# Patient Record
Sex: Male | Born: 1950
Health system: Southern US, Community
[De-identification: ages and names within clinical notes are randomized; demographics above are authoritative.]

## PROBLEM LIST (undated history)

## (undated) DIAGNOSIS — K7689 Other specified diseases of liver: Secondary | ICD-10-CM

## (undated) DIAGNOSIS — J8410 Pulmonary fibrosis, unspecified: Secondary | ICD-10-CM

## (undated) DIAGNOSIS — M5136 Other intervertebral disc degeneration, lumbar region: Secondary | ICD-10-CM

## (undated) DIAGNOSIS — S32050A Wedge compression fracture of fifth lumbar vertebra, initial encounter for closed fracture: Secondary | ICD-10-CM

## (undated) DIAGNOSIS — G4752 REM sleep behavior disorder: Secondary | ICD-10-CM

## (undated) DIAGNOSIS — M51369 Other intervertebral disc degeneration, lumbar region without mention of lumbar back pain or lower extremity pain: Secondary | ICD-10-CM

## (undated) DIAGNOSIS — K449 Diaphragmatic hernia without obstruction or gangrene: Secondary | ICD-10-CM

## (undated) DIAGNOSIS — G4733 Obstructive sleep apnea (adult) (pediatric): Secondary | ICD-10-CM

## (undated) DIAGNOSIS — T7840XA Allergy, unspecified, initial encounter: Secondary | ICD-10-CM

## (undated) DIAGNOSIS — I10 Essential (primary) hypertension: Secondary | ICD-10-CM

## (undated) DIAGNOSIS — C61 Malignant neoplasm of prostate: Secondary | ICD-10-CM

## (undated) DIAGNOSIS — D1800 Hemangioma unspecified site: Secondary | ICD-10-CM

## (undated) DIAGNOSIS — G4750 Parasomnia, unspecified: Secondary | ICD-10-CM

## (undated) DIAGNOSIS — R7303 Prediabetes: Secondary | ICD-10-CM

## (undated) HISTORY — DX: Other intervertebral disc degeneration, lumbar region without mention of lumbar back pain or lower extremity pain: M51.369

## (undated) HISTORY — PX: OTHER SURGICAL HISTORY: SHX169

## (undated) HISTORY — DX: Other specified diseases of liver: K76.89

## (undated) HISTORY — DX: Pulmonary fibrosis, unspecified: J84.10

## (undated) HISTORY — DX: Parasomnia, unspecified: G47.50

## (undated) HISTORY — DX: Allergy, unspecified, initial encounter: T78.40XA

## (undated) HISTORY — DX: Other intervertebral disc degeneration, lumbar region: M51.36

## (undated) HISTORY — DX: Prediabetes: R73.03

## (undated) HISTORY — DX: Diaphragmatic hernia without obstruction or gangrene: K44.9

## (undated) HISTORY — DX: Hemangioma unspecified site: D18.00

## (undated) HISTORY — DX: Obstructive sleep apnea (adult) (pediatric): G47.33

## (undated) HISTORY — DX: REM sleep behavior disorder: G47.52

## (undated) HISTORY — PX: CARDIAC CATHETERIZATION: SHX172

## (undated) HISTORY — PX: HERNIA REPAIR: SHX51

## (undated) HISTORY — DX: Wedge compression fracture of fifth lumbar vertebra, initial encounter for closed fracture: S32.050A

---

## 2017-09-10 LAB — HM COLONOSCOPY

## 2019-06-26 ENCOUNTER — Telehealth: Payer: Self-pay

## 2019-06-26 NOTE — Telephone Encounter (Signed)
Copied from [place] [id number]. Topic: Appointment Scheduling - New Patient >> Jun 25, 2019  5:31 PM Yvette Rack wrote: New patient would like to establish care.  Pt doe not have a preference of providers. Pt would like to establish with the first available.  Route to department's PEC pool.

## 2019-06-26 NOTE — Telephone Encounter (Signed)
Request forwarded to scheduling dept.

## 2019-07-08 ENCOUNTER — Other Ambulatory Visit: Payer: Self-pay

## 2019-07-11 ENCOUNTER — Ambulatory Visit (INDEPENDENT_AMBULATORY_CARE_PROVIDER_SITE_OTHER): Payer: Medicare HMO | Admitting: Family Medicine

## 2019-07-11 ENCOUNTER — Encounter: Payer: Self-pay | Admitting: Family Medicine

## 2019-07-11 ENCOUNTER — Other Ambulatory Visit: Payer: Self-pay

## 2019-07-11 VITALS — BP 140/82 | HR 69 | Temp 97.3°F | Resp 18 | Ht 68.0 in | Wt 141.0 lb

## 2019-07-11 DIAGNOSIS — Z0000 Encounter for general adult medical examination without abnormal findings: Secondary | ICD-10-CM

## 2019-07-11 DIAGNOSIS — Z125 Encounter for screening for malignant neoplasm of prostate: Secondary | ICD-10-CM

## 2019-07-11 NOTE — Progress Notes (Signed)
Subjective:    Patient ID: David Church, male    DOB: 1951-07-28, 68 y.o.   MRN: [id number]  HPI   Patient presents to clinic to establish with PCP.  He recently moved to [place] with his wife for retirement.  They moved here from [place].  He currently takes no medications and has not taken any medications in many years.  He reports no pertinent past medical history.  Otherwise social, surgical family history reviewed and updated accordingly in chart.  He does see the eye doctor annually and dentist twice per year.  He has those appointments upcoming to establish with providers here in [place].  He did have colonoscopy 6 months ago.  It was normal and was told to return in 10 years.  History reviewed. No pertinent past medical history.   Social History   Tobacco Use  . Smoking status: Current Every Day Smoker  . Smokeless tobacco: Never Used  Substance Use Topics  . Alcohol use: Yes   Past Surgical History:  Procedure Laterality Date  . HERNIA REPAIR     15 years ago   Family History  Problem Relation Age of Onset  . Cancer Mother   . Diabetes Mother   . Alzheimer's disease Father    Review of Systems  Constitutional: Negative for chills, fatigue and fever.  HENT: Negative for congestion, ear pain, sinus pain and sore throat.   Eyes: Negative.   Respiratory: Negative for cough, shortness of breath and wheezing.   Cardiovascular: Negative for chest pain, palpitations and leg swelling.  Gastrointestinal: Negative for abdominal pain, diarrhea, nausea and vomiting.  Genitourinary: Negative for dysuria, frequency and urgency.  Musculoskeletal: Negative for arthralgias and myalgias.  Skin: Negative for color change, pallor and rash.  Neurological: Negative for syncope, light-headedness and headaches.  Psychiatric/Behavioral: The patient is not nervous/anxious.       Objective:   Physical Exam Vitals signs and nursing note reviewed.  Constitutional:    General: He is not in acute distress.    Appearance: He is not toxic-appearing.  HENT:     Head: Normocephalic and atraumatic.     Right Ear: Tympanic membrane, ear canal and external ear normal.     Left Ear: Tympanic membrane, ear canal and external ear normal.  Eyes:     General: No scleral icterus.    Extraocular Movements: Extraocular movements intact.     Conjunctiva/sclera: Conjunctivae normal.     Pupils: Pupils are equal, round, and reactive to light.  Neck:     Musculoskeletal: Normal range of motion and neck supple.  Cardiovascular:     Rate and Rhythm: Normal rate and regular rhythm.     Heart sounds: Normal heart sounds.  Pulmonary:     Effort: Pulmonary effort is normal. No respiratory distress.     Breath sounds: Normal breath sounds.  Abdominal:     General: Bowel sounds are normal. There is no distension.     Palpations: Abdomen is soft. There is no mass.     Tenderness: There is no abdominal tenderness. There is no right CVA tenderness, left CVA tenderness, guarding or rebound.  Genitourinary:    Comments: Declines GU exam Musculoskeletal:     Right lower leg: No edema.     Left lower leg: No edema.  Skin:    General: Skin is warm and dry.     Capillary Refill: Capillary refill takes less than 2 seconds.     Coloration: Skin is  not jaundiced or pale.  Neurological:     General: No focal deficit present.     Mental Status: He is alert and oriented to person, place, and time.     Gait: Gait normal.  Psychiatric:        Mood and Affect: Mood normal.        Behavior: Behavior normal.        Thought Content: Thought content normal.        Judgment: Judgment normal.    Today's Vitals   07/11/19 1032  BP: 140/82  Pulse: 69  Resp: 18  Temp: (!) 97.3 F (36.3 C)  TempSrc: Temporal  SpO2: 98%  Weight: 141 lb (64 kg)  Height: 5\' 8"  (1.727 m)   Body mass index is 21.44 kg/m.    Assessment & Plan:    Well adult exam - overall patient appears to be a  healthy 68 year old male.  We will get standard screening blood work including CBC, CMP, lipid panel, thyroid panel and PSA.  He does follow with a dentist and eye doctor regularly and is planning to establish care with providers in his new state.  He always wears seatbelt when in car.  Also discussed safe sun practices including wearing SPF at least of 30 when outdoors.  Patient made aware that flu vaccines will be available in approximately September 2020.  Patient will follow-up every 6 months for general checkup.  He is aware we will call him with lab results when they are available and he can return to clinic sooner if any issues arise.

## 2019-07-25 ENCOUNTER — Other Ambulatory Visit (INDEPENDENT_AMBULATORY_CARE_PROVIDER_SITE_OTHER): Payer: Medicare HMO

## 2019-07-25 ENCOUNTER — Other Ambulatory Visit: Payer: Self-pay

## 2019-07-25 DIAGNOSIS — Z125 Encounter for screening for malignant neoplasm of prostate: Secondary | ICD-10-CM | POA: Diagnosis not present

## 2019-07-25 DIAGNOSIS — D72820 Lymphocytosis (symptomatic): Secondary | ICD-10-CM

## 2019-07-25 DIAGNOSIS — R944 Abnormal results of kidney function studies: Secondary | ICD-10-CM

## 2019-07-25 DIAGNOSIS — Z0000 Encounter for general adult medical examination without abnormal findings: Secondary | ICD-10-CM | POA: Diagnosis not present

## 2019-07-25 LAB — CBC WITH DIFFERENTIAL/PLATELET
Basophils Absolute: 0.1 10*3/uL (ref 0.0–0.1)
Basophils Relative: 1.3 % (ref 0.0–3.0)
Eosinophils Absolute: 0.1 10*3/uL (ref 0.0–0.7)
Eosinophils Relative: 1.4 % (ref 0.0–5.0)
HCT: 46.9 % (ref 39.0–52.0)
Hemoglobin: 15.6 g/dL (ref 13.0–17.0)
Lymphocytes Relative: 58.1 % — ABNORMAL HIGH (ref 12.0–46.0)
Lymphs Abs: 2.9 10*3/uL (ref 0.7–4.0)
MCHC: 33.3 g/dL (ref 30.0–36.0)
MCV: 89 fl (ref 78.0–100.0)
Monocytes Absolute: 0.5 10*3/uL (ref 0.1–1.0)
Monocytes Relative: 9.2 % (ref 3.0–12.0)
Neutro Abs: 1.5 10*3/uL (ref 1.4–7.7)
Neutrophils Relative %: 30 % — ABNORMAL LOW (ref 43.0–77.0)
Platelets: 205 10*3/uL (ref 150.0–400.0)
RBC: 5.27 Mil/uL (ref 4.22–5.81)
RDW: 13.5 % (ref 11.5–15.5)
WBC: 5 10*3/uL (ref 4.0–10.5)

## 2019-07-25 LAB — THYROID PANEL WITH TSH
Free Thyroxine Index: 2.5 (ref 1.4–3.8)
T3 Uptake: 31 % (ref 22–35)
T4, Total: 8 ug/dL (ref 4.9–10.5)
TSH: 0.97 mIU/L (ref 0.40–4.50)

## 2019-07-25 LAB — COMPREHENSIVE METABOLIC PANEL
ALT: 15 U/L (ref 0–53)
AST: 18 U/L (ref 0–37)
Albumin: 4.1 g/dL (ref 3.5–5.2)
Alkaline Phosphatase: 53 U/L (ref 39–117)
BUN: 18 mg/dL (ref 6–23)
CO2: 29 mEq/L (ref 19–32)
Calcium: 9 mg/dL (ref 8.4–10.5)
Chloride: 104 mEq/L (ref 96–112)
Creatinine, Ser: 1.31 mg/dL (ref 0.40–1.50)
GFR: 54.65 mL/min — ABNORMAL LOW (ref >60.00)
Glucose, Bld: 81 mg/dL (ref 70–99)
Potassium: 4.4 mEq/L (ref 3.5–5.1)
Sodium: 140 mEq/L (ref 135–145)
Total Bilirubin: 0.5 mg/dL (ref 0.2–1.2)
Total Protein: 7.1 g/dL (ref 6.0–8.3)

## 2019-07-25 LAB — LIPID PANEL
Cholesterol: 211 mg/dL — ABNORMAL HIGH (ref 0–200)
HDL: 66.8 mg/dL (ref >39.00)
LDL Cholesterol: 127 mg/dL — ABNORMAL HIGH (ref 0–99)
NonHDL: 144.1
Total CHOL/HDL Ratio: 3
Triglycerides: 85 mg/dL (ref 0.0–149.0)
VLDL: 17 mg/dL (ref 0.0–40.0)

## 2019-07-25 LAB — PSA, MEDICARE: PSA: 1.11 ng/ml (ref 0.10–4.00)

## 2019-07-28 NOTE — Addendum Note (Signed)
Addended by: Philis Nettle on: 07/28/2019 12:57 PM   Modules accepted: Orders

## 2019-07-31 DIAGNOSIS — H524 Presbyopia: Secondary | ICD-10-CM | POA: Diagnosis not present

## 2019-07-31 DIAGNOSIS — Z0100 Encounter for examination of eyes and vision without abnormal findings: Secondary | ICD-10-CM | POA: Diagnosis not present

## 2019-08-13 ENCOUNTER — Other Ambulatory Visit (INDEPENDENT_AMBULATORY_CARE_PROVIDER_SITE_OTHER): Payer: Medicare HMO

## 2019-08-13 ENCOUNTER — Other Ambulatory Visit: Payer: Self-pay

## 2019-08-13 DIAGNOSIS — D72820 Lymphocytosis (symptomatic): Secondary | ICD-10-CM

## 2019-08-13 DIAGNOSIS — R944 Abnormal results of kidney function studies: Secondary | ICD-10-CM

## 2019-08-13 LAB — BASIC METABOLIC PANEL
BUN: 14 mg/dL (ref 6–23)
CO2: 28 mEq/L (ref 19–32)
Calcium: 9.7 mg/dL (ref 8.4–10.5)
Chloride: 103 mEq/L (ref 96–112)
Creatinine, Ser: 1.12 mg/dL (ref 0.40–1.50)
GFR: 65.47 mL/min (ref >60.00)
Glucose, Bld: 97 mg/dL (ref 70–99)
Potassium: 4.9 mEq/L (ref 3.5–5.1)
Sodium: 138 mEq/L (ref 135–145)

## 2019-08-13 LAB — CBC WITH DIFFERENTIAL/PLATELET
Basophils Absolute: 0 10*3/uL (ref 0.0–0.1)
Basophils Relative: 0.8 % (ref 0.0–3.0)
Eosinophils Absolute: 0 10*3/uL (ref 0.0–0.7)
Eosinophils Relative: 0.7 % (ref 0.0–5.0)
HCT: 45.4 % (ref 39.0–52.0)
Hemoglobin: 15.1 g/dL (ref 13.0–17.0)
Lymphocytes Relative: 60.1 % — ABNORMAL HIGH (ref 12.0–46.0)
Lymphs Abs: 3.6 10*3/uL (ref 0.7–4.0)
MCHC: 33.2 g/dL (ref 30.0–36.0)
MCV: 87.8 fl (ref 78.0–100.0)
Monocytes Absolute: 0.5 10*3/uL (ref 0.1–1.0)
Monocytes Relative: 8.9 % (ref 3.0–12.0)
Neutro Abs: 1.8 10*3/uL (ref 1.4–7.7)
Neutrophils Relative %: 29.5 % — ABNORMAL LOW (ref 43.0–77.0)
Platelets: 535 10*3/uL — ABNORMAL HIGH (ref 150.0–400.0)
RBC: 5.17 Mil/uL (ref 4.22–5.81)
RDW: 13.3 % (ref 11.5–15.5)
WBC: 6 10*3/uL (ref 4.0–10.5)

## 2019-08-15 NOTE — Addendum Note (Signed)
Addended by: Philis Nettle on: 08/15/2019 12:52 PM   Modules accepted: Orders

## 2019-08-22 ENCOUNTER — Other Ambulatory Visit: Payer: Self-pay

## 2019-08-22 ENCOUNTER — Inpatient Hospital Stay: Payer: Medicare HMO | Attending: Oncology | Admitting: Oncology

## 2019-08-22 ENCOUNTER — Encounter: Payer: Self-pay | Admitting: Oncology

## 2019-08-22 ENCOUNTER — Inpatient Hospital Stay: Payer: Medicare HMO

## 2019-08-22 VITALS — BP 167/85 | HR 64 | Temp 97.1°F | Resp 16 | Ht 69.0 in | Wt 140.6 lb

## 2019-08-22 DIAGNOSIS — R590 Localized enlarged lymph nodes: Secondary | ICD-10-CM | POA: Diagnosis not present

## 2019-08-22 DIAGNOSIS — Z79899 Other long term (current) drug therapy: Secondary | ICD-10-CM | POA: Diagnosis not present

## 2019-08-22 DIAGNOSIS — R69 Illness, unspecified: Secondary | ICD-10-CM | POA: Diagnosis not present

## 2019-08-22 DIAGNOSIS — D709 Neutropenia, unspecified: Secondary | ICD-10-CM | POA: Diagnosis not present

## 2019-08-22 DIAGNOSIS — F17210 Nicotine dependence, cigarettes, uncomplicated: Secondary | ICD-10-CM | POA: Diagnosis not present

## 2019-08-22 LAB — CBC WITH DIFFERENTIAL/PLATELET
Abs Immature Granulocytes: 0 10*3/uL (ref 0.00–0.07)
Basophils Absolute: 0.1 10*3/uL (ref 0.0–0.1)
Basophils Relative: 2 %
Eosinophils Absolute: 0 10*3/uL (ref 0.0–0.5)
Eosinophils Relative: 0 %
HCT: 46.5 % (ref 39.0–52.0)
Hemoglobin: 15 g/dL (ref 13.0–17.0)
Immature Granulocytes: 0 %
Lymphocytes Relative: 55 %
Lymphs Abs: 1.8 10*3/uL (ref 0.7–4.0)
MCH: 28.5 pg (ref 26.0–34.0)
MCHC: 32.3 g/dL (ref 30.0–36.0)
MCV: 88.4 fL (ref 80.0–100.0)
Monocytes Absolute: 0.3 10*3/uL (ref 0.1–1.0)
Monocytes Relative: 9 %
Neutro Abs: 1.2 10*3/uL — ABNORMAL LOW (ref 1.7–7.7)
Neutrophils Relative %: 34 %
Platelets: 253 10*3/uL (ref 150–400)
RBC: 5.26 MIL/uL (ref 4.22–5.81)
RDW: 12.6 % (ref 11.5–15.5)
WBC: 3.4 10*3/uL — ABNORMAL LOW (ref 4.0–10.5)
nRBC: 0 % (ref 0.0–0.2)

## 2019-08-22 LAB — TECHNOLOGIST SMEAR REVIEW: Plt Morphology: ADEQUATE

## 2019-08-22 LAB — VITAMIN B12: Vitamin B-12: 356 pg/mL (ref 180–914)

## 2019-08-22 LAB — FOLATE: Folate: 12.9 ng/mL (ref >5.9)

## 2019-08-22 NOTE — Progress Notes (Signed)
Pt has allergies a lot and has sinus infection 2 times a year. And take atb. Mostly spring and fall

## 2019-08-22 NOTE — Progress Notes (Signed)
Hematology/Oncology Consult note [facility] [phone] [phone]  Patient Care Team: Jodelle Green, FNP as PCP - General (Family Medicine)   Name of the patient: David Church  [id number]  [birth year]    Reason for referral-neutropenia   Referring physician- Philis Nettle  Date of visit: 08/22/19   History of presenting illness-patient is a 68 year old African-American male referred to [place] for neutrop[place].CBC from 07/25/2019 showed white count of 5, H&H of 15.6/46.9 with a platelet count of 205.  He had neutropenia with an absolute neutrophil count of 1.5 with relative lymphocytosis.  Repeat CBC a month later showed white count of 6 with an ANC of 1.8 again with relative lymphocytosis.  Platelet counts this time were higher at 535.  Patient is otherwise healthy and other than seasonal allergies he reports no other medical problems and takes no other medications.  Denies any over-the-counter herbal medications.  His appetite is good and his weight has remained stable.  Denies any recurrent infections or hospitalizations.  He does smoke about 2 cigarettes/day and has been doing so for the last several years.  I do not have any prior CBCs for comparison.  He moved here from [place] but does not have any prior CBCs with him.  ECOG PS- 0  Pain scale- 0   Review of systems- Review of Systems  Constitutional: Negative for chills, fever, malaise/fatigue and weight loss.  HENT: Negative for congestion, ear discharge and nosebleeds.   Eyes: Negative for blurred vision.  Respiratory: Negative for cough, hemoptysis, sputum production, shortness of breath and wheezing.   Cardiovascular: Negative for chest pain, palpitations, orthopnea and claudication.  Gastrointestinal: Negative for abdominal pain, blood in stool, constipation, diarrhea, heartburn, melena, nausea and vomiting.  Genitourinary: Negative for dysuria, flank pain, frequency, hematuria and  urgency.  Musculoskeletal: Negative for back pain, joint pain and myalgias.  Skin: Negative for rash.  Neurological: Negative for dizziness, tingling, focal weakness, seizures, weakness and headaches.  Endo/Heme/Allergies: Does not bruise/bleed easily.  Psychiatric/Behavioral: Negative for depression and suicidal ideas. The patient does not have insomnia.     No Known Allergies  There are no active problems to display for this patient.    Past Medical History:  Diagnosis Date  . Allergy      Past Surgical History:  Procedure Laterality Date  . HERNIA REPAIR     15 years ago    Social History   Socioeconomic History  . Marital status: Married    Spouse name: Not on file  . Number of children: Not on file  . Years of education: Not on file  . Highest education level: Not on file  Occupational History  . Not on file  Social Needs  . Financial resource strain: Not on file  . Food insecurity    Worry: Not on file    Inability: Not on file  . Transportation needs    Medical: Not on file    Non-medical: Not on file  Tobacco Use  . Smoking status: Current Every Day Smoker    Packs/day: 0.25  . Smokeless tobacco: Never Used  . Tobacco comment: 2 cig a day  Substance and Sexual Activity  . Alcohol use: Yes    Alcohol/week: 2.0 standard drinks    Types: 2 Cans of beer per week    Comment: 2 beers a day and wine on special occasion  . Drug use: Not Currently    Types: Marijuana, Cocaine    Comment: did  drugs but quit in early 30's  . Sexual activity: Yes  Lifestyle  . Physical activity    Days per week: Not on file    Minutes per session: Not on file  . Stress: Not on file  Relationships  . Social Herbalist on phone: Not on file    Gets together: Not on file    Attends religious service: Not on file    Active member of club or organization: Not on file    Attends meetings of clubs or organizations: Not on file    Relationship status: Not on file   . Intimate partner violence    Fear of current or ex partner: Not on file    Emotionally abused: Not on file    Physically abused: Not on file    Forced sexual activity: Not on file  Other Topics Concern  . Not on file  Social History Narrative  . Not on file     Family History  Problem Relation Age of Onset  . Cancer Mother   . Diabetes Mother   . Alzheimer's disease Father   . Heart attack Father      Current Outpatient Medications:  .  cetirizine (ZYRTEC) 10 MG tablet, Take 10 mg by mouth daily., Disp: , Rfl:    Physical exam:  Vitals:   08/22/19 1128  BP: (!) 167/85  Pulse: 64  Resp: 16  Temp: (!) 97.1 F (36.2 C)  TempSrc: Tympanic  Weight: 140 lb 9.6 oz (63.8 kg)  Height: 5\' 9"  (1.753 m)   Physical Exam Constitutional:      Comments: Thin African-American male in no acute distress.  HENT:     Head: Normocephalic and atraumatic.  Eyes:     Pupils: Pupils are equal, round, and reactive to light.  Neck:     Musculoskeletal: Normal range of motion.  Cardiovascular:     Rate and Rhythm: Normal rate and regular rhythm.     Heart sounds: Normal heart sounds.  Pulmonary:     Effort: Pulmonary effort is normal.     Breath sounds: Normal breath sounds.  Abdominal:     General: Bowel sounds are normal. There is no distension.     Palpations: Abdomen is soft.     Tenderness: There is no abdominal tenderness.     Comments: No palpable splenomegaly  Lymphadenopathy:     Comments: Palpable right axillary adenopathy  Skin:    General: Skin is warm and dry.  Neurological:     Mental Status: He is alert and oriented to person, place, and time.        CMP Latest Ref Rng & Units 08/13/2019  Glucose 70 - 99 mg/dL 97  BUN 6 - 23 mg/dL 14  Creatinine 0.40 - 1.50 mg/dL 1.12  Sodium 135 - 145 mEq/L 138  Potassium 3.5 - 5.1 mEq/L 4.9  Chloride 96 - 112 mEq/L 103  CO2 19 - 32 mEq/L 28  Calcium 8.4 - 10.5 mg/dL 9.7  Total Protein 6.0 - 8.3 g/dL -  Total  Bilirubin 0.2 - 1.2 mg/dL -  Alkaline Phos 39 - 117 U/L -  AST 0 - 37 U/L -  ALT 0 - 53 U/L -   CBC Latest Ref Rng & Units 08/22/2019  WBC 4.0 - 10.5 K/uL 3.4(L)  Hemoglobin 13.0 - 17.0 g/dL 15.0  Hematocrit 39.0 - 52.0 % 46.5  Platelets 150 - 400 K/uL 253     Assessment and plan-  Patient is a 68 y.o. male referred for neutropenia  I have 2 recent CBCs for comparison which shows a normal white count but differential shows neutropenia with an [place] between 1.5-1.8.  I do not have any prior CBCs for comparison.  His hemoglobin was normal but his platelet count was 200 on one occasion and 500 on the other.  I plan to get a repeat CBC with differential, smear review, B12, folate, HIV and hepatitis C testing.  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  He has also been a long-term smoker.  I will proceed with a CT chest at this time  I will see him back in 2 weeks time to discuss the results of the CT and blood work and further management   Thank you for this kind referral and the opportunity to participate in the care of this patient   Visit Diagnosis 1. Neutropenia, unspecified type ([place])   2. Axillary adenopathy   3. Localized enlarged lymph nodes     Dr. Randa Evens, MD, MPH [facility] at [facility] [id number] 08/22/2019 1:41 PM

## 2019-08-23 LAB — HEPATITIS C ANTIBODY: HCV Ab: 0.2 s/co ratio (ref 0.0–0.9)

## 2019-08-23 LAB — HIV ANTIBODY (ROUTINE TESTING W REFLEX): HIV Screen 4th Generation wRfx: NONREACTIVE

## 2019-09-02 ENCOUNTER — Ambulatory Visit: Admission: RE | Admit: 2019-09-02 | Payer: Medicare HMO | Source: Ambulatory Visit

## 2019-09-04 ENCOUNTER — Other Ambulatory Visit: Payer: Self-pay

## 2019-09-04 ENCOUNTER — Encounter: Payer: Self-pay | Admitting: Oncology

## 2019-09-04 NOTE — Progress Notes (Signed)
Patient stated that he had been doing well with no complaints. Patient would like for physician to call wife when she is in the room.

## 2019-09-05 ENCOUNTER — Other Ambulatory Visit: Payer: Self-pay | Admitting: *Deleted

## 2019-09-05 ENCOUNTER — Inpatient Hospital Stay: Payer: Medicare HMO | Attending: Oncology | Admitting: Oncology

## 2019-09-05 ENCOUNTER — Other Ambulatory Visit: Payer: Self-pay

## 2019-09-05 VITALS — BP 167/91 | HR 73 | Temp 98.3°F | Ht 68.0 in | Wt 142.0 lb

## 2019-09-05 DIAGNOSIS — R590 Localized enlarged lymph nodes: Secondary | ICD-10-CM

## 2019-09-05 DIAGNOSIS — F17210 Nicotine dependence, cigarettes, uncomplicated: Secondary | ICD-10-CM | POA: Diagnosis not present

## 2019-09-05 DIAGNOSIS — R69 Illness, unspecified: Secondary | ICD-10-CM | POA: Diagnosis not present

## 2019-09-05 DIAGNOSIS — D709 Neutropenia, unspecified: Secondary | ICD-10-CM

## 2019-09-05 NOTE — Progress Notes (Signed)
Hematology/Oncology Consult note [facility]  Telephone:([phone] [phone]  Patient Care Team: Jodelle Green, FNP as PCP - General (Family Medicine)   Name of the patient: David Church  [id number]  06/09/1951   Date of visit: 09/05/19  Diagnosis- 1.  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  Right axillary adenopathy of unknown etiology  Chief complaint/ Reason for visit-discuss results of blood work  Heme/Onc history: patient is a 68 year old African-American male referred to [place] for neutrop[place].CBC from 07/25/2019 showed white count of 5, H&H of 15.6/46.9 with a platelet count of 205.  He had neutropenia with an absolute neutrophil count of 1.5 with relative lymphocytosis.  Repeat CBC a month later showed white count of 6 with an ANC of 1.8 again with relative lymphocytosis.  Platelet counts this time were higher at 535.  Patient is otherwise healthy and other than seasonal allergies he reports no other medical problems and takes no other medications.  Denies any over-the-counter herbal medications.  His appetite is good and his weight has remained stable.  Denies any recurrent infections or hospitalizations.  He does smoke about 2 cigarettes/day and has been doing so for the last several years.  I do not have any prior CBCs for comparison.  He moved here from [place] but does not have any prior CBCs with him.   Interval history-patient feels well and denies any complaints at this time.  His wife states that occasionally he has fevers about twice a year sometimes even needing hospitalization and IV antibiotics.  ECOG PS- 0 Pain scale- 0   Review of systems- Review of Systems  Constitutional: Negative for chills, fever, malaise/fatigue and weight loss.  HENT: Negative for congestion, ear discharge and nosebleeds.   Eyes: Negative for blurred vision.  Respiratory: Negative for cough, hemoptysis, sputum production, shortness of breath  and wheezing.   Cardiovascular: Negative for chest pain, palpitations, orthopnea and claudication.  Gastrointestinal: Negative for abdominal pain, blood in stool, constipation, diarrhea, heartburn, melena, nausea and vomiting.  Genitourinary: Negative for dysuria, flank pain, frequency, hematuria and urgency.  Musculoskeletal: Negative for back pain, joint pain and myalgias.  Skin: Negative for rash.  Neurological: Negative for dizziness, tingling, focal weakness, seizures, weakness and headaches.  Endo/Heme/Allergies: Does not bruise/bleed easily.  Psychiatric/Behavioral: Negative for depression and suicidal ideas. The patient does not have insomnia.       No Known Allergies   Past Medical History:  Diagnosis Date  . Allergy      Past Surgical History:  Procedure Laterality Date  . HERNIA REPAIR     15 years ago    Social History   Socioeconomic History  . Marital status: Married    Spouse name: Not on file  . Number of children: Not on file  . Years of education: Not on file  . Highest education level: Not on file  Occupational History  . Not on file  Social Needs  . Financial resource strain: Not on file  . Food insecurity    Worry: Not on file    Inability: Not on file  . Transportation needs    Medical: Not on file    Non-medical: Not on file  Tobacco Use  . Smoking status: Current Every Day Smoker    Packs/day: 0.25  . Smokeless tobacco: Never Used  . Tobacco comment: 2 cig a day  Substance and Sexual Activity  . Alcohol use: Yes    Alcohol/week: 2.0 standard drinks  Types: 2 Cans of beer per week    Comment: 2 beers a day and wine on special occasion  . Drug use: Not Currently    Types: Marijuana, Cocaine    Comment: did drugs but quit in early 30's  . Sexual activity: Yes  Lifestyle  . Physical activity    Days per week: Not on file    Minutes per session: Not on file  . Stress: Not on file  Relationships  . Social Herbalist on  phone: Not on file    Gets together: Not on file    Attends religious service: Not on file    Active member of club or organization: Not on file    Attends meetings of clubs or organizations: Not on file    Relationship status: Not on file  . Intimate partner violence    Fear of current or ex partner: Not on file    Emotionally abused: Not on file    Physically abused: Not on file    Forced sexual activity: Not on file  Other Topics Concern  . Not on file  Social History Narrative  . Not on file    Family History  Problem Relation Age of Onset  . Cancer Mother   . Diabetes Mother   . Alzheimer's disease Father   . Heart attack Father      Current Outpatient Medications:  .  cetirizine (ZYRTEC) 10 MG tablet, Take 10 mg by mouth daily., Disp: , Rfl:   Physical exam:  Vitals:   09/05/19 1125  BP: (!) 167/91  Pulse: 73  Temp: 98.3 F (36.8 C)  TempSrc: Tympanic  Weight: 142 lb (64.4 kg)  Height: 5' 8"  (1.727 m)   Physical Exam Constitutional:      General: He is not in acute distress. HENT:     Head: Normocephalic and atraumatic.  Eyes:     Pupils: Pupils are equal, round, and reactive to light.  Neck:     Musculoskeletal: Normal range of motion.  Cardiovascular:     Rate and Rhythm: Normal rate and regular rhythm.     Heart sounds: Normal heart sounds.  Pulmonary:     Effort: Pulmonary effort is normal.     Breath sounds: Normal breath sounds.  Abdominal:     General: Bowel sounds are normal.     Palpations: Abdomen is soft.  Lymphadenopathy:     Comments: Bilateral palpable axillary adenopathy right greater than left.  Midline chest wall swelling noted consistent with lipoma  Skin:    General: Skin is warm and dry.  Neurological:     Mental Status: He is alert and oriented to person, place, and time.      CMP Latest Ref Rng & Units 08/13/2019  Glucose 70 - 99 mg/dL 97  BUN 6 - 23 mg/dL 14  Creatinine 0.40 - 1.50 mg/dL 1.12  Sodium 135 - 145 mEq/L  138  Potassium 3.5 - 5.1 mEq/L 4.9  Chloride 96 - 112 mEq/L 103  CO2 19 - 32 mEq/L 28  Calcium 8.4 - 10.5 mg/dL 9.7  Total Protein 6.0 - 8.3 g/dL -  Total Bilirubin 0.2 - 1.2 mg/dL -  Alkaline Phos 39 - 117 U/L -  AST 0 - 37 U/L -  ALT 0 - 53 U/L -   CBC Latest Ref Rng & Units 08/22/2019  WBC 4.0 - 10.5 K/uL 3.4(L)  Hemoglobin 13.0 - 17.0 g/dL 15.0  Hematocrit 39.0 - 52.0 %  46.5  Platelets 150 - 400 K/uL 253     Assessment and plan- Patient is a 68 y.o. male referred for neutropenia likely benign ethnic neutropenia but also found to have right axillary adenopathy  1.  I do not have any prior CBCs over the last couple of years for comparison.Patient has isolated neutropenia in the absence of other cytopenias.  B12 folate HIV and hep C are unremarkable.  Severe review was unremarkable.  LFTs are normal.  I am inclined to monitor this conservatively without a bone marrow biopsy at this time.  I will see him back in 1 month's time with a repeat CBC with differential  2.  Patient has bilateral palpable axillary adenopathy right greater than left on clinical exam.  CT chest and ultrasound was both denied by his insurance.  For his insurance he would need the right biopsy first and I will therefore refer him to general surgery for consideration of axillary lymph node biopsy for tissue diagnosis.  I do not feel any other areas of palpable adenopathy other than axilla.   Visit Diagnosis 1. Axillary adenopathy   2. Neutropenia, unspecified type [facility])      Dr. Randa Evens, MD, MPH [facility] at [facility] [phone] 09/05/2019 2:04 PM

## 2019-09-08 ENCOUNTER — Ambulatory Visit: Payer: Medicare HMO | Admitting: Surgery

## 2019-09-10 ENCOUNTER — Ambulatory Visit (INDEPENDENT_AMBULATORY_CARE_PROVIDER_SITE_OTHER): Payer: Medicare HMO | Admitting: Surgery

## 2019-09-10 ENCOUNTER — Encounter: Payer: Self-pay | Admitting: Surgery

## 2019-09-10 ENCOUNTER — Other Ambulatory Visit: Payer: Self-pay

## 2019-09-10 VITALS — BP 165/88 | HR 65 | Temp 97.9°F | Ht 69.0 in | Wt 140.2 lb

## 2019-09-10 DIAGNOSIS — R590 Localized enlarged lymph nodes: Secondary | ICD-10-CM

## 2019-09-10 NOTE — Patient Instructions (Signed)
Our surgery scheduler will call you to schedule a surgery date. Please see your [facility] sheet for information.

## 2019-09-11 ENCOUNTER — Encounter: Payer: Self-pay | Admitting: Surgery

## 2019-09-11 ENCOUNTER — Telehealth: Payer: Self-pay | Admitting: Surgery

## 2019-09-11 NOTE — Telephone Encounter (Signed)
Pt has been advised of pre admission date/time, Covid Testing date and Surgery date.  Surgery Date: 09/18/19 with Dr Othelia Pulling excisional biopsy of axillary node.  Preadmission Testing Date: 09/15/19 @ 1:30pm-patient advised to arrive at [facility] entrance.  Covid Testing Date: 09/15/19 following pre admit appt - patient advised to go to the [place] [facility]  [employer] Video sent via [employer] and [employer].  Patient has been made aware to call [phone], between 1-3:00pm the day before surgery, to find out what time to arrive.

## 2019-09-11 NOTE — Progress Notes (Signed)
Patient ID: David Church, male   DOB: 1950-12-24, 68 y.o.   MRN: [id number]  HPI David Church is a 68 y.o. male in consultation at the request of Dr. Janese Banks for lymphadenopathy.  He has been work-up for neutropenia likely benign.  He does have right axillary adenopathy of unknown etiology.  He denies any fevers any chills any B type symptoms.  Does have a pending CT of the chest.  BMP was completely normal and CBC was normal. Is able to perform more than 6 METS of activity without any shortness of breath or chest pain.  HPI  Past Medical History:  Diagnosis Date  . Allergy     Past Surgical History:  Procedure Laterality Date  . HERNIA REPAIR     15 years ago-right inguinal     Family History  Problem Relation Age of Onset  . Cancer Mother 52  . Diabetes Mother   . Alzheimer's disease Father   . Heart attack Father   . Stomach cancer Maternal Aunt   . Lung cancer Maternal Uncle     Social History Social History   Tobacco Use  . Smoking status: Current Every Day Smoker    Packs/day: 0.10  . Smokeless tobacco: Never Used  . Tobacco comment: 2 cig a day  Substance Use Topics  . Alcohol use: Yes    Alcohol/week: 2.0 standard drinks    Types: 2 Cans of beer per week    Comment: 2 beers a day and wine on special occasion  . Drug use: Not Currently    Types: Marijuana, Cocaine    Comment: did drugs but quit in early 30's    No Known Allergies  Current Outpatient Medications  Medication Sig Dispense Refill  . cetirizine (ZYRTEC) 10 MG tablet Take 10 mg by mouth daily.     No current facility-administered medications for this visit.      Review of Systems Full ROS  was asked and was negative except for the information on the HPI  Physical Exam Blood pressure (!) 165/88, pulse 65, temperature 97.9 F (36.6 C), height 5\' 9"  (1.753 m), weight 140 lb 3.2 oz (63.6 kg), SpO2 97 %. CONSTITUTIONAL: NAD EYES: Pupils are equal, round, , Sclera are non-icteric. EARS, NOSE,  MOUTH AND THROAT: The oropharynx is clear. The oral mucosa is pink and moist. Hearing is intact to voice. LYMPH NODES:  Small Bilateral axillary LAD, Right > left. RESPIRATORY:  Lungs are clear. There is normal respiratory effort, with equal breath sounds bilaterally, and without pathologic use of accessory muscles. CARDIOVASCULAR: Heart is regular without murmurs, gallops, or rubs. GI: The abdomen is soft, nontender, and nondistended. There are no palpable masses. There is no hepatosplenomegaly. There are normal bowel sounds in all quadrants. GU: Rectal deferred.   MUSCULOSKELETAL: Normal muscle strength and tone. No cyanosis or edema.   SKIN: Turgor is good and there are no pathologic skin lesions or ulcers. NEUROLOGIC: Motor and sensation is grossly normal. Cranial nerves are grossly intact. PSYCH:  Oriented to person, place and time. Affect is normal.  Data Reviewed I have personally reviewed the patient's imaging, laboratory findings and medical records.    Assessment/Plan Lymphadenopathy of the right axilla.  Likely benign.  Oncology requesting excisional biopsy for further work-up.  Pressure discussed with the patient detail.  Risk benefit and possible applications including but not limited to: Bleeding, infection, lymphedema, chronic pain, inability to obtain a final diagnosis.  He understands and wishes to proceed.  [place],  MD FACS General Surgeon 09/11/2019, 3:39 PM

## 2019-09-11 NOTE — H&P (View-Only) (Signed)
Patient ID: David Church, male   DOB: 1951-08-16, 68 y.o.   MRN: [id number]  HPI David Church is a 69 y.o. male in consultation at the request of Dr. Janese Banks for lymphadenopathy.  He has been work-up for neutropenia likely benign.  He does have right axillary adenopathy of unknown etiology.  He denies any fevers any chills any B type symptoms.  Does have a pending CT of the chest.  BMP was completely normal and CBC was normal. Is able to perform more than 6 METS of activity without any shortness of breath or chest pain.  HPI  Past Medical History:  Diagnosis Date  . Allergy     Past Surgical History:  Procedure Laterality Date  . HERNIA REPAIR     15 years ago-right inguinal     Family History  Problem Relation Age of Onset  . Cancer Mother 31  . Diabetes Mother   . Alzheimer's disease Father   . Heart attack Father   . Stomach cancer Maternal Aunt   . Lung cancer Maternal Uncle     Social History Social History   Tobacco Use  . Smoking status: Current Every Day Smoker    Packs/day: 0.10  . Smokeless tobacco: Never Used  . Tobacco comment: 2 cig a day  Substance Use Topics  . Alcohol use: Yes    Alcohol/week: 2.0 standard drinks    Types: 2 Cans of beer per week    Comment: 2 beers a day and wine on special occasion  . Drug use: Not Currently    Types: Marijuana, Cocaine    Comment: did drugs but quit in early 30's    No Known Allergies  Current Outpatient Medications  Medication Sig Dispense Refill  . cetirizine (ZYRTEC) 10 MG tablet Take 10 mg by mouth daily.     No current facility-administered medications for this visit.      Review of Systems Full ROS  was asked and was negative except for the information on the HPI  Physical Exam Blood pressure (!) 165/88, pulse 65, temperature 97.9 F (36.6 C), height 5\' 9"  (1.753 m), weight 140 lb 3.2 oz (63.6 kg), SpO2 97 %. CONSTITUTIONAL: NAD EYES: Pupils are equal, round, , Sclera are non-icteric. EARS, NOSE,  MOUTH AND THROAT: The oropharynx is clear. The oral mucosa is pink and moist. Hearing is intact to voice. LYMPH NODES:  Small Bilateral axillary LAD, Right > left. RESPIRATORY:  Lungs are clear. There is normal respiratory effort, with equal breath sounds bilaterally, and without pathologic use of accessory muscles. CARDIOVASCULAR: Heart is regular without murmurs, gallops, or rubs. GI: The abdomen is soft, nontender, and nondistended. There are no palpable masses. There is no hepatosplenomegaly. There are normal bowel sounds in all quadrants. GU: Rectal deferred.   MUSCULOSKELETAL: Normal muscle strength and tone. No cyanosis or edema.   SKIN: Turgor is good and there are no pathologic skin lesions or ulcers. NEUROLOGIC: Motor and sensation is grossly normal. Cranial nerves are grossly intact. PSYCH:  Oriented to person, place and time. Affect is normal.  Data Reviewed I have personally reviewed the patient's imaging, laboratory findings and medical records.    Assessment/Plan Lymphadenopathy of the right axilla.  Likely benign.  Oncology requesting excisional biopsy for further work-up.  Pressure discussed with the patient detail.  Risk benefit and possible applications including but not limited to: Bleeding, infection, lymphedema, chronic pain, inability to obtain a final diagnosis.  He understands and wishes to proceed.  [place],  MD FACS General Surgeon 09/11/2019, 3:39 PM

## 2019-09-12 ENCOUNTER — Telehealth: Payer: Self-pay | Admitting: Surgery

## 2019-09-12 NOTE — Telephone Encounter (Signed)
Pt has been advised of pre admission date/time, Covid Testing date and Surgery date.  Surgery Date: 09/18/19 with Dr Othelia Pulling excisional bx-axillary node.  Preadmission Testing Date: 09/15/19 @ 1:30pm-office interview-pt advised to arrive at the [facility] be escorted to office.  Covid Testing Date: 09/15/19 following pre admission appointment - patient advised to go to the [facility] ([place])  [employer] Video sent via [employer] [facility] and [employer].  Patient has been made aware to call [phone], between 1-3:00pm the day before surgery, to find out what time to arrive.

## 2019-09-15 ENCOUNTER — Other Ambulatory Visit: Payer: Self-pay

## 2019-09-15 ENCOUNTER — Encounter
Admission: RE | Admit: 2019-09-15 | Discharge: 2019-09-15 | Disposition: A | Discharge status: Home / Self Care | Payer: Medicare HMO | Source: Ambulatory Visit | Attending: Surgery | Admitting: Surgery

## 2019-09-15 DIAGNOSIS — Z20828 Contact with and (suspected) exposure to other viral communicable diseases: Secondary | ICD-10-CM | POA: Diagnosis not present

## 2019-09-15 DIAGNOSIS — R030 Elevated blood-pressure reading, without diagnosis of hypertension: Secondary | ICD-10-CM | POA: Diagnosis not present

## 2019-09-15 DIAGNOSIS — Z01818 Encounter for other preprocedural examination: Secondary | ICD-10-CM | POA: Diagnosis not present

## 2019-09-15 NOTE — Patient Instructions (Signed)
Your procedure is scheduled on: Thursday 09/18/19 Report to [facility]. To find out your arrival time please call [phone] between 1PM - 3PM on Wednesday 09/17/19.  Remember: Instructions that are not followed completely may result in serious medical risk, up to and including death, or upon the discretion of your surgeon and anesthesiologist your surgery may need to be rescheduled.     _X__ 1. Do not eat food after midnight the night before your procedure.                 No gum chewing or hard candies. You may drink clear liquids up to 2 hours                 before you are scheduled to arrive for your surgery- DO not drink clear                 liquids within 2 hours of the start of your surgery.                 Clear Liquids include:  water, apple juice without pulp, clear carbohydrate                 drink such as Clearfast or Gatorade, Black Coffee or Tea (Do not add                 anything to coffee or tea). Diabetics water only  __X__2.  On the morning of surgery brush your teeth with toothpaste and water, you                 may rinse your mouth with mouthwash if you wish.  Do not swallow any              toothpaste of mouthwash.     _X__ 3.  No Alcohol for 24 hours before or after surgery.   _X__ 4.  Do Not Smoke or use e-cigarettes For 24 Hours Prior to Your Surgery.                 Do not use any chewable tobacco products for at least 6 hours prior to                 surgery.  ____  5.  Bring all medications with you on the day of surgery if instructed.   __X__  6.  Notify your doctor if there is any change in your medical condition      (cold, fever, infections).     Do not wear jewelry, make-up, hairpins, clips or nail polish. Do not wear lotions, powders, or perfumes.  Do not shave 48 hours prior to surgery. Men may shave face and neck. Do not bring valuables to the hospital.    [facility] is not responsible  for any belongings or valuables.  Contacts, dentures/partials or body piercings may not be worn into surgery. Bring a case for your contacts, glasses or hearing aids, a denture cup will be supplied. Leave your suitcase in the car. After surgery it may be brought to your room. For patients admitted to the hospital, discharge time is determined by your treatment team.   Patients discharged the day of surgery will not be allowed to drive home.   Please read over the following fact sheets that you were given:   MRSA Information  __X__ Take these medicines the morning of surgery with A SIP OF WATER:  1. cetirizine (ZYRTEC)  2.   3.   4.  5.  6.  ____ Fleet Enema (as directed)   __X__ Use CHG Soap/SAGE wipes as directed  ____ Use inhalers on the day of surgery  ____ Stop metformin/Janumet/Farxiga 2 days prior to surgery    ____ Take 1/2 of usual insulin dose the night before surgery. No insulin the morning          of surgery.   ____ Stop Blood Thinners Coumadin/Plavix/Xarelto/Pleta/Pradaxa/Eliquis/Effient/Aspirin  on   Or contact your Surgeon, Cardiologist or Medical Doctor regarding  ability to stop your blood thinners  __X__ Stop Anti-inflammatories 7 days before surgery such as Advil, Ibuprofen, Motrin,  BC or Goodies Powder, Naprosyn, Naproxen, Aleve, Aspirin    __X__ Stop all herbal supplements, fish oil or vitamin E until after surgery.    ____ Bring C-Pap to the hospital.

## 2019-09-16 ENCOUNTER — Telehealth: Payer: Self-pay

## 2019-09-16 LAB — SARS CORONAVIRUS 2 (TAT 6-24 HRS): SARS Coronavirus 2: NEGATIVE

## 2019-09-16 NOTE — Telephone Encounter (Signed)
Left message covid negative

## 2019-09-17 MED ORDER — CEFAZOLIN SODIUM-DEXTROSE 2-4 GM/100ML-% IV SOLN
2.0000 g | INTRAVENOUS | Status: AC
  Administered 2019-09-18: 2 g via INTRAVENOUS

## 2019-09-18 ENCOUNTER — Ambulatory Visit: Payer: Medicare HMO | Admitting: Anesthesiology

## 2019-09-18 ENCOUNTER — Encounter: Payer: Self-pay | Admitting: Anesthesiology

## 2019-09-18 ENCOUNTER — Ambulatory Visit
Admission: RE | Admit: 2019-09-18 | Discharge: 2019-09-18 | Disposition: A | Discharge status: Home / Self Care | Payer: Medicare HMO | Attending: Surgery | Admitting: Surgery

## 2019-09-18 ENCOUNTER — Encounter
Admission: RE | Disposition: A | Discharge status: Home / Self Care | Payer: Self-pay | Source: Home / Self Care | Attending: Surgery

## 2019-09-18 ENCOUNTER — Other Ambulatory Visit: Payer: Self-pay

## 2019-09-18 DIAGNOSIS — Z79899 Other long term (current) drug therapy: Secondary | ICD-10-CM | POA: Insufficient documentation

## 2019-09-18 DIAGNOSIS — R69 Illness, unspecified: Secondary | ICD-10-CM | POA: Diagnosis not present

## 2019-09-18 DIAGNOSIS — F17210 Nicotine dependence, cigarettes, uncomplicated: Secondary | ICD-10-CM | POA: Insufficient documentation

## 2019-09-18 DIAGNOSIS — R590 Localized enlarged lymph nodes: Secondary | ICD-10-CM | POA: Diagnosis not present

## 2019-09-18 HISTORY — PX: AXILLARY LYMPH NODE BIOPSY: SHX5737

## 2019-09-18 SURGERY — AXILLARY LYMPH NODE BIOPSY
Anesthesia: General | Laterality: Right

## 2019-09-18 MED ORDER — MEPERIDINE HCL 50 MG/ML IJ SOLN: 6.2500 mg | INTRAMUSCULAR | Status: DC | PRN

## 2019-09-18 MED ORDER — PROMETHAZINE HCL 25 MG/ML IJ SOLN: 6.2500 mg | INTRAMUSCULAR | Status: DC | PRN

## 2019-09-18 MED ORDER — MIDAZOLAM HCL 2 MG/2ML IJ SOLN
INTRAMUSCULAR | Status: DC | PRN
  Administered 2019-09-18: 2 mg via INTRAVENOUS

## 2019-09-18 MED ORDER — EPINEPHRINE PF 1 MG/ML IJ SOLN
INTRAMUSCULAR | Status: AC
  Filled 2019-09-18: qty 1

## 2019-09-18 MED ORDER — KETOROLAC TROMETHAMINE 30 MG/ML IJ SOLN: 30.0000 mg | Freq: Once | INTRAMUSCULAR | Status: DC | PRN

## 2019-09-18 MED ORDER — OXYCODONE HCL 5 MG PO TABS
5.0000 mg | ORAL_TABLET | Freq: Once | ORAL | Status: AC
  Administered 2019-09-18: 5 mg via ORAL

## 2019-09-18 MED ORDER — PHENYLEPHRINE HCL (PRESSORS) 10 MG/ML IV SOLN
INTRAVENOUS | Status: DC | PRN
  Administered 2019-09-18: 200 ug via INTRAVENOUS
  Administered 2019-09-18: 100 ug via INTRAVENOUS

## 2019-09-18 MED ORDER — ACETAMINOPHEN 500 MG PO TABS
ORAL_TABLET | ORAL | Status: AC
  Administered 2019-09-18: 11:00:00 1000 mg via ORAL
  Filled 2019-09-18: qty 2

## 2019-09-18 MED ORDER — CHLORHEXIDINE GLUCONATE CLOTH 2 % EX PADS: 6.0000 | MEDICATED_PAD | Freq: Once | CUTANEOUS | Status: DC

## 2019-09-18 MED ORDER — FAMOTIDINE 20 MG PO TABS
ORAL_TABLET | ORAL | Status: AC
  Administered 2019-09-18: 20 mg via ORAL
  Filled 2019-09-18: qty 1

## 2019-09-18 MED ORDER — CELECOXIB 200 MG PO CAPS
ORAL_CAPSULE | ORAL | Status: AC
  Administered 2019-09-18: 11:00:00 200 mg via ORAL
  Filled 2019-09-18: qty 1

## 2019-09-18 MED ORDER — BUPIVACAINE HCL (PF) 0.25 % IJ SOLN
INTRAMUSCULAR | Status: AC
  Filled 2019-09-18: qty 30

## 2019-09-18 MED ORDER — ACETAMINOPHEN 500 MG PO TABS
1000.0000 mg | ORAL_TABLET | ORAL | Status: AC
  Administered 2019-09-18: 11:00:00 1000 mg via ORAL

## 2019-09-18 MED ORDER — OXYCODONE HCL 5 MG PO TABS
ORAL_TABLET | ORAL | Status: AC
  Filled 2019-09-18: qty 1

## 2019-09-18 MED ORDER — ACETAMINOPHEN 160 MG/5ML PO SOLN
325.0000 mg | ORAL | Status: DC | PRN
  Filled 2019-09-18: qty 20.3

## 2019-09-18 MED ORDER — HYDROCODONE-ACETAMINOPHEN 7.5-325 MG PO TABS: 1.0000 | ORAL_TABLET | Freq: Once | ORAL | Status: DC | PRN

## 2019-09-18 MED ORDER — HYDROCODONE-ACETAMINOPHEN 5-325 MG PO TABS: 1.0000 | ORAL_TABLET | Freq: Four times a day (QID) | ORAL | 0 refills | Status: DC | PRN

## 2019-09-18 MED ORDER — CELECOXIB 200 MG PO CAPS
200.0000 mg | ORAL_CAPSULE | ORAL | Status: AC
  Administered 2019-09-18: 11:00:00 200 mg via ORAL

## 2019-09-18 MED ORDER — LACTATED RINGERS IV SOLN
INTRAVENOUS | Status: DC | PRN
  Administered 2019-09-18 (×2): via INTRAVENOUS

## 2019-09-18 MED ORDER — GABAPENTIN 300 MG PO CAPS
300.0000 mg | ORAL_CAPSULE | ORAL | Status: AC
  Administered 2019-09-18: 11:00:00 300 mg via ORAL

## 2019-09-18 MED ORDER — FENTANYL CITRATE (PF) 100 MCG/2ML IJ SOLN
INTRAMUSCULAR | Status: AC
  Administered 2019-09-18: 25 ug via INTRAVENOUS
  Filled 2019-09-18: qty 2

## 2019-09-18 MED ORDER — FENTANYL CITRATE (PF) 100 MCG/2ML IJ SOLN
INTRAMUSCULAR | Status: AC
  Filled 2019-09-18: qty 2

## 2019-09-18 MED ORDER — PROPOFOL 10 MG/ML IV BOLUS
INTRAVENOUS | Status: DC | PRN
  Administered 2019-09-18: 200 mg via INTRAVENOUS

## 2019-09-18 MED ORDER — LIDOCAINE HCL (CARDIAC) PF 100 MG/5ML IV SOSY
PREFILLED_SYRINGE | INTRAVENOUS | Status: DC | PRN
  Administered 2019-09-18: 100 mg via INTRAVENOUS

## 2019-09-18 MED ORDER — ONDANSETRON HCL 4 MG/2ML IJ SOLN
INTRAMUSCULAR | Status: DC | PRN
  Administered 2019-09-18: 4 mg via INTRAVENOUS

## 2019-09-18 MED ORDER — DEXAMETHASONE SODIUM PHOSPHATE 10 MG/ML IJ SOLN
INTRAMUSCULAR | Status: DC | PRN
  Administered 2019-09-18: 10 mg via INTRAVENOUS

## 2019-09-18 MED ORDER — CEFAZOLIN SODIUM-DEXTROSE 2-4 GM/100ML-% IV SOLN
INTRAVENOUS | Status: AC
  Filled 2019-09-18: qty 100

## 2019-09-18 MED ORDER — LACTATED RINGERS IV SOLN
INTRAVENOUS | Status: DC
  Administered 2019-09-18: 11:00:00 via INTRAVENOUS

## 2019-09-18 MED ORDER — FAMOTIDINE 20 MG PO TABS
20.0000 mg | ORAL_TABLET | Freq: Once | ORAL | Status: AC
  Administered 2019-09-18: 11:00:00 20 mg via ORAL

## 2019-09-18 MED ORDER — BUPIVACAINE-EPINEPHRINE 0.25% -1:200000 IJ SOLN
INTRAMUSCULAR | Status: DC | PRN
  Administered 2019-09-18: 20 mL

## 2019-09-18 MED ORDER — MIDAZOLAM HCL 2 MG/2ML IJ SOLN
INTRAMUSCULAR | Status: AC
  Filled 2019-09-18: qty 2

## 2019-09-18 MED ORDER — GABAPENTIN 300 MG PO CAPS
ORAL_CAPSULE | ORAL | Status: AC
  Administered 2019-09-18: 300 mg via ORAL
  Filled 2019-09-18: qty 1

## 2019-09-18 MED ORDER — FENTANYL CITRATE (PF) 100 MCG/2ML IJ SOLN
25.0000 ug | INTRAMUSCULAR | Status: DC | PRN
  Administered 2019-09-18 (×5): 25 ug via INTRAVENOUS

## 2019-09-18 MED ORDER — ACETAMINOPHEN 325 MG PO TABS: 325.0000 mg | ORAL_TABLET | ORAL | Status: DC | PRN

## 2019-09-18 SURGICAL SUPPLY — 25 items
BLADE SURG 15 STRL LF DISP TIS (BLADE) ×1 IMPLANT
BLADE SURG 15 STRL SS (BLADE) ×2
CANISTER SUCT 1200ML W/VALVE (MISCELLANEOUS) ×3 IMPLANT
CHLORAPREP W/TINT 26 (MISCELLANEOUS) ×3 IMPLANT
COVER WAND RF STERILE (DRAPES) ×3 IMPLANT
DERMABOND ADVANCED (GAUZE/BANDAGES/DRESSINGS) ×2
DERMABOND ADVANCED .7 DNX12 (GAUZE/BANDAGES/DRESSINGS) IMPLANT
DRAPE LAPAROTOMY 77X122 PED (DRAPES) ×3 IMPLANT
ELECT CAUTERY BLADE 6.4 (BLADE) ×2 IMPLANT
ELECT REM PT RETURN 9FT ADLT (ELECTROSURGICAL) ×3
ELECTRODE REM PT RTRN 9FT ADLT (ELECTROSURGICAL) ×1 IMPLANT
GLOVE BIO SURGEON STRL SZ7 (GLOVE) ×3 IMPLANT
GOWN STRL REUS W/ TWL LRG LVL3 (GOWN DISPOSABLE) ×2 IMPLANT
GOWN STRL REUS W/TWL LRG LVL3 (GOWN DISPOSABLE) ×4
KIT TURNOVER KIT A (KITS) ×3 IMPLANT
LABEL OR SOLS (LABEL) ×3 IMPLANT
NEEDLE HYPO 22GX1.5 SAFETY (NEEDLE) ×3 IMPLANT
NS IRRIG 500ML POUR BTL (IV SOLUTION) ×3 IMPLANT
PACK BASIN MINOR ARMC (MISCELLANEOUS) ×3 IMPLANT
SUT MNCRL AB 4-0 PS2 18 (SUTURE) ×3 IMPLANT
SUT VIC AB 2-0 CT1 27 (SUTURE) ×2
SUT VIC AB 2-0 CT1 TAPERPNT 27 (SUTURE) ×1 IMPLANT
SUT VIC AB 3-0 54X BRD REEL (SUTURE) ×1 IMPLANT
SUT VIC AB 3-0 BRD 54 (SUTURE) ×2
SYR CONTROL 10ML LL (SYRINGE) ×3 IMPLANT

## 2019-09-18 NOTE — Transfer of Care (Signed)
Immediate Anesthesia Transfer of Care Note  Patient: David Church  Procedure(s) Performed: AXILLARY LYMPH NODE BIOPSY (Right )  Patient Location: PACU  Anesthesia Type:General  Level of Consciousness: sedated  Airway & Oxygen Therapy: Patient Spontanous Breathing and Patient connected to face mask oxygen  Post-op Assessment: Report given to RN and Post -op Vital signs reviewed and stable  Post vital signs: Reviewed and stable  Last Vitals:  Vitals Value Taken Time  BP 137/89 09/18/19 1403  Temp 36.1 C 09/18/19 1402  Pulse 76 09/18/19 1405  Resp 21 09/18/19 1405  SpO2 97 % 09/18/19 1405  Vitals shown include unvalidated device data.  Last Pain:  Vitals:   09/18/19 1402  TempSrc:   PainSc: 4          Complications: No apparent anesthesia complications

## 2019-09-18 NOTE — Discharge Instructions (Signed)

## 2019-09-18 NOTE — Progress Notes (Signed)
Pt has pain 4/10. Pt received tylenol in pre-op. Dr. Lavone Neri called for request for PO Oxycodone 5mg . Acknowledged. Orders received.

## 2019-09-18 NOTE — Interval H&P Note (Signed)
History and Physical Interval Note:  09/18/2019 11:15 AM  David Church  has presented today for surgery, with the diagnosis of Right Axillary Adenopathy.  The various methods of treatment have been discussed with the patient and family. After consideration of risks, benefits and other options for treatment, the patient has consented to  Procedure(s): AXILLARY LYMPH NODE BIOPSY (Right) as a surgical intervention.  The patient's history has been reviewed, patient examined, no change in status, stable for surgery.  I have reviewed the patient's chart and labs.  Questions were answered to the patient's satisfaction.     [place]

## 2019-09-18 NOTE — Anesthesia Post-op Follow-up Note (Signed)
Anesthesia QCDR form completed.        

## 2019-09-18 NOTE — Op Note (Signed)
  09/18/2019  1:00 PM  PATIENT:  David Church  68 y.o. male  PRE-OPERATIVE DIAGNOSIS:  Axillary adenopathy  POST-OPERATIVE DIAGNOSIS:  Same  PROCEDURE:  1. Excision of two deep axillary lymph nodes right side.  SURGEON:  Surgeon(s) and Role:    * Pabon, Diego F, MD - Primary  [id number]  FINDINGS: Normal axillary nodes  ANESTHESIA: LMA   DICTATION:  Patient was explained about the  Procedure in detail. Risks, benefits and possible complications and a consent was obtained. The patient taken to the operating room and placed in the supine position.  Incision was created and the axillary space was dissected. We identified the axillary vein, IC brachial nerve and preserve the structures. No evidence of LAD was observed. We explored the deep axillary basin and found two normal appearing nodes. We dissected them in the standard fashion. I clips the channels. Good hemostasis was observed. The wound was closed in a two layered fashion w 3-0 vicryls and 4-0 monocryl. Dermbond used to coat the skin.  Marcaine quarter percent with epinephrine was injected around the wound site. Needle and laparotomy counts were correct and there were no immediate complications.  Diego Sarita Haver, MD

## 2019-09-18 NOTE — Anesthesia Preprocedure Evaluation (Addendum)
Anesthesia Evaluation  Patient identified by MRN, date of birth, ID band Patient awake    Reviewed: Allergy & Precautions, H&P , NPO status , reviewed documented beta blocker date and time   Airway Mallampati: II  TM Distance: >3 FB Neck ROM: full    Dental  (+) Dental Advidsory Given, Caps Upper incisors is perm bridge:   Pulmonary Current Smoker and Patient abstained from smoking.,    Pulmonary exam normal        Cardiovascular negative cardio ROS Normal cardiovascular exam     Neuro/Psych negative neurological ROS  negative psych ROS   GI/Hepatic negative GI ROS, Neg liver ROS, neg GERD  ,  Endo/Other  negative endocrine ROS  Renal/GU      Musculoskeletal   Abdominal   Peds  Hematology negative hematology ROS (+)   Anesthesia Other Findings Past Medical History: No date: Allergy Past Surgical History: No date: HERNIA REPAIR     Comment:  15 years ago-right inguinal  BMI    Body Mass Index: 20.51 kg/m     Reproductive/Obstetrics                            Anesthesia Physical Anesthesia Plan  ASA: II  Anesthesia Plan: General   Post-op Pain Management:    Induction: Intravenous  PONV Risk Score and Plan: Ondansetron and Treatment may vary due to age or medical condition  Airway Management Planned: LMA  Additional Equipment:   Intra-op Plan:   Post-operative Plan: Extubation in OR  Informed Consent: I have reviewed the patients History and Physical, chart, labs and discussed the procedure including the risks, benefits and alternatives for the proposed anesthesia with the patient or authorized representative who has indicated his/her understanding and acceptance.     Dental Advisory Given  Plan Discussed with: CRNA  Anesthesia Plan Comments:         Anesthesia Quick Evaluation

## 2019-09-18 NOTE — Anesthesia Procedure Notes (Signed)
Procedure Name: LMA Insertion Date/Time: 09/18/2019 12:19 PM Performed by: Justus [place], CRNA Pre-anesthesia Checklist: Patient identified, Patient being monitored, Timeout performed, Emergency Drugs available and Suction available Patient Re-evaluated:Patient Re-evaluated prior to induction Oxygen Delivery Method: Circle system utilized Preoxygenation: Pre-oxygenation with 100% oxygen Induction Type: IV induction Ventilation: Mask ventilation without difficulty LMA: LMA inserted LMA Size: 4.5 Tube type: Oral Number of attempts: 1 Placement Confirmation: positive ETCO2 and breath sounds checked- equal and bilateral Tube secured with: Tape Dental Injury: Teeth and Oropharynx as per pre-operative assessment

## 2019-09-19 ENCOUNTER — Encounter: Payer: Self-pay | Admitting: Surgery

## 2019-09-22 NOTE — Anesthesia Postprocedure Evaluation (Signed)
Anesthesia Post Note  Patient: David Church  Procedure(s) Performed: AXILLARY LYMPH NODE BIOPSY (Right )  Patient location during evaluation: PACU Anesthesia Type: General Level of consciousness: awake and alert Pain management: pain level controlled Vital Signs Assessment: post-procedure vital signs reviewed and stable Respiratory status: spontaneous breathing, nonlabored ventilation and respiratory function stable Cardiovascular status: blood pressure returned to baseline and stable Postop Assessment: no apparent nausea or vomiting Anesthetic complications: no     Last Vitals:  Vitals:   09/18/19 1402 09/18/19 1422  BP: 137/89 (!) (P) 146/83  Pulse: 74 (P) 78  Resp: 12 (P) 14  Temp: (!) 36.1 C   SpO2: 96% (P) 100%    Last Pain:  Vitals:   09/18/19 1422  TempSrc:   PainSc: (P) 0-No pain                 Alphonsus Sias

## 2019-09-23 ENCOUNTER — Encounter: Payer: Self-pay | Admitting: Oncology

## 2019-09-25 LAB — SURGICAL PATHOLOGY

## 2019-10-01 ENCOUNTER — Telehealth (INDEPENDENT_AMBULATORY_CARE_PROVIDER_SITE_OTHER): Payer: Medicare HMO | Admitting: Surgery

## 2019-10-01 ENCOUNTER — Other Ambulatory Visit: Payer: Self-pay

## 2019-10-01 DIAGNOSIS — Z09 Encounter for follow-up examination after completed treatment for conditions other than malignant neoplasm: Secondary | ICD-10-CM

## 2019-10-06 ENCOUNTER — Other Ambulatory Visit: Payer: Self-pay

## 2019-10-06 ENCOUNTER — Encounter: Payer: Self-pay | Admitting: Oncology

## 2019-10-06 NOTE — Progress Notes (Signed)
Patient stated that she had been doing well with no concerns. 

## 2019-10-06 NOTE — Progress Notes (Signed)
Telemedicine Surgical Follow Up  10/06/2019  David Church is an 68 y.o. male.   No chief complaint on file.   HPI:  Location of the patient:home Time spent on call: 4 min Total Time spent in the encounter including counseling and coordination of care: 89min Location of the Provider: office The patient had given consent for a telemedicine visit and they understands the limitations associated with this including but not limited to privacy, cyber security and technology issues. Persons participating in the visit: PT David Church is a 68 year old male status post excision of right axillary node.  Pathology review no evidence of malignancy.  This was discussed in detail with the patient.  He is doing very well.  No fevers no chills and no complications from the site   Past Medical History:  Diagnosis Date  . Allergy     Past Surgical History:  Procedure Laterality Date  . AXILLARY LYMPH NODE BIOPSY Right 09/18/2019   Procedure: AXILLARY LYMPH NODE BIOPSY;  Surgeon: Jules Husbands, MD;  Location: [facility] ORS;  Service: General;  Laterality: Right;  . HERNIA REPAIR     15 years ago-right inguinal     Family History  Problem Relation Age of Onset  . Cancer Mother 3  . Diabetes Mother   . Alzheimer's disease Father   . Heart attack Father   . Stomach cancer Maternal Aunt   . Lung cancer Maternal Uncle     Social History:  reports that he has been smoking. He has been smoking about 0.10 packs per day. He has never used smokeless tobacco. He reports current alcohol use of about 2.0 standard drinks of alcohol per week. He reports previous drug use. Drugs: Marijuana and Cocaine.  Allergies: No Known Allergies  Medications reviewed.    ROS Full ROS performed and is otherwise negative other than what is stated in HPI   Assessment/Plan: Overall doing very well.  Encouraged to follow-up with Dr. Janese Banks for further work-up and care. The pt was provided an opportunity to ask questions  and all were answered. The pt was advised to call back or seek in-person evaluation if the symptoms worsen or if the condition fails to improve as anticipated. Greater than 50% of the 10 minutes  visit was spent in counseling/coordination of care   Caroleen Hamman, MD [place] Surgeon

## 2019-10-07 ENCOUNTER — Inpatient Hospital Stay (HOSPITAL_BASED_OUTPATIENT_CLINIC_OR_DEPARTMENT_OTHER): Payer: Medicare HMO | Admitting: Oncology

## 2019-10-07 ENCOUNTER — Inpatient Hospital Stay: Payer: Medicare HMO | Attending: Oncology

## 2019-10-07 ENCOUNTER — Other Ambulatory Visit: Payer: Self-pay

## 2019-10-07 VITALS — BP 156/84 | HR 60 | Temp 97.8°F | Resp 16 | Wt 143.5 lb

## 2019-10-07 DIAGNOSIS — Z801 Family history of malignant neoplasm of trachea, bronchus and lung: Secondary | ICD-10-CM | POA: Diagnosis not present

## 2019-10-07 DIAGNOSIS — R69 Illness, unspecified: Secondary | ICD-10-CM | POA: Diagnosis not present

## 2019-10-07 DIAGNOSIS — R590 Localized enlarged lymph nodes: Secondary | ICD-10-CM | POA: Diagnosis not present

## 2019-10-07 DIAGNOSIS — D709 Neutropenia, unspecified: Secondary | ICD-10-CM

## 2019-10-07 DIAGNOSIS — F17210 Nicotine dependence, cigarettes, uncomplicated: Secondary | ICD-10-CM | POA: Diagnosis not present

## 2019-10-07 DIAGNOSIS — Z79899 Other long term (current) drug therapy: Secondary | ICD-10-CM | POA: Diagnosis not present

## 2019-10-07 LAB — CBC WITH DIFFERENTIAL/PLATELET
Abs Immature Granulocytes: 0.01 10*3/uL (ref 0.00–0.07)
Basophils Absolute: 0.1 10*3/uL (ref 0.0–0.1)
Basophils Relative: 1 %
Eosinophils Absolute: 0.1 10*3/uL (ref 0.0–0.5)
Eosinophils Relative: 1 %
HCT: 47 % (ref 39.0–52.0)
Hemoglobin: 15.2 g/dL (ref 13.0–17.0)
Immature Granulocytes: 0 %
Lymphocytes Relative: 46 %
Lymphs Abs: 2.8 10*3/uL (ref 0.7–4.0)
MCH: 28.8 pg (ref 26.0–34.0)
MCHC: 32.3 g/dL (ref 30.0–36.0)
MCV: 89 fL (ref 80.0–100.0)
Monocytes Absolute: 0.7 10*3/uL (ref 0.1–1.0)
Monocytes Relative: 11 %
Neutro Abs: 2.5 10*3/uL (ref 1.7–7.7)
Neutrophils Relative %: 41 %
Platelets: 210 10*3/uL (ref 150–400)
RBC: 5.28 MIL/uL (ref 4.22–5.81)
RDW: 12.5 % (ref 11.5–15.5)
WBC: 6.1 10*3/uL (ref 4.0–10.5)
nRBC: 0 % (ref 0.0–0.2)

## 2019-10-09 NOTE — Progress Notes (Signed)
Hematology/Oncology Consult note [facility]  [phone] [phone]  Patient Care Team: Jodelle Green, FNP as PCP - General (Family Medicine)   Name of the patient: David Church  [id number]  1951/05/26   Date of visit: 10/09/19  Diagnosis-  1.  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  Right axillary adenopathy of unknown etiology  Chief complaint/ Reason for visit-discussed biopsy results and further management  Heme/Onc history: patient is a 68 year old African-American male referred to [place] forneutropenia.CBC from 07/25/2019 showed white count of 5, H&H of 15.6/46.9 with a platelet count of 205. He had neutropenia with an absolute neutrophil count of 1.5 with relative lymphocytosis. Repeat CBC a month later showed white count of 6 with an ANC of 1.8 again with relative lymphocytosis. Platelet counts this time were higher at 535. Patient is otherwise healthy and other than seasonal allergies he reports no other medical problems and takes no other medications.Denies any over-the-counter herbal medications. His appetite is good and his weight has remained stable. Denies any recurrent infections or hospitalizations. He does smoke about 2 cigarettes/day and has been doing so for the last several years.I do not have any prior CBCs for comparison. He moved here from [place] but does not have any prior CBCs with him.  Further work-up of neutropenia revealed no abnormality in peripheral smear, B12 folate and LFTs were normal.  HIV and hepatitis C testing was negative.  Patient was found to have palpable right axillary adenopathy and was referred to surgery for core biopsy.  Core biopsy revealed a small specimen and no evidence of malignancy in the limited specimen.  Flow cytometry was also negative for clonal process  Interval history-patient reports feeling well and denies any complaints at this time.  Denies any fever chills or  recurrent infections  ECOG PS- 0 Pain scale- 0  Review of systems- Review of Systems  Constitutional: Negative for chills, fever, malaise/fatigue and weight loss.  HENT: Negative for congestion, ear discharge and nosebleeds.   Eyes: Negative for blurred vision.  Respiratory: Negative for cough, hemoptysis, sputum production, shortness of breath and wheezing.   Cardiovascular: Negative for chest pain, palpitations, orthopnea and claudication.  Gastrointestinal: Negative for abdominal pain, blood in stool, constipation, diarrhea, heartburn, melena, nausea and vomiting.  Genitourinary: Negative for dysuria, flank pain, frequency, hematuria and urgency.  Musculoskeletal: Negative for back pain, joint pain and myalgias.  Skin: Negative for rash.  Neurological: Negative for dizziness, tingling, focal weakness, seizures, weakness and headaches.  Endo/Heme/Allergies: Does not bruise/bleed easily.  Psychiatric/Behavioral: Negative for depression and suicidal ideas. The patient does not have insomnia.       No Known Allergies   Past Medical History:  Diagnosis Date   Allergy      Past Surgical History:  Procedure Laterality Date   AXILLARY LYMPH NODE BIOPSY Right 09/18/2019   Procedure: AXILLARY LYMPH NODE BIOPSY;  Surgeon: Jules Husbands, MD;  Location: [facility] ORS;  Service: General;  Laterality: Right;   HERNIA REPAIR     15 years ago-right inguinal     Social History   Socioeconomic History   Marital status: Married    Spouse name: Not on file   Number of children: Not on file   Years of education: Not on file   Highest education level: Not on file  Occupational History   Not on file  Social Needs   Financial resource strain: Not on file   Food insecurity  Worry: Not on file    Inability: Not on file   Transportation needs    Medical: Not on file    Non-medical: Not on file  Tobacco Use   Smoking status: Current Every Day Smoker    Packs/day: 0.10    Smokeless tobacco: Never Used   Tobacco comment: 2 cig a day  Substance and Sexual Activity   Alcohol use: Yes    Alcohol/week: 2.0 standard drinks    Types: 2 Cans of beer per week    Comment: 2 beers a day and wine on special occasion   Drug use: Not Currently    Types: Marijuana, Cocaine    Comment: did drugs but quit in early 30's   Sexual activity: Yes  Lifestyle   Physical activity    Days per week: Not on file    Minutes per session: Not on file   Stress: Not on file  Relationships   Social connections    Talks on phone: Not on file    Gets together: Not on file    Attends religious service: Not on file    Active member of club or organization: Not on file    Attends meetings of clubs or organizations: Not on file    Relationship status: Not on file   Intimate partner violence    Fear of current or ex partner: Not on file    Emotionally abused: Not on file    Physically abused: Not on file    Forced sexual activity: Not on file  Other Topics Concern   Not on file  Social History Narrative   Not on file    Family History  Problem Relation Age of Onset   Cancer Mother 67   Diabetes Mother    Alzheimer's disease Father    Heart attack Father    Stomach cancer Maternal Aunt    Lung cancer Maternal Uncle      Current Outpatient Medications:    cetirizine (ZYRTEC) 10 MG tablet, Take 10 mg by mouth daily., Disp: , Rfl:   Physical exam:  Vitals:   10/07/19 0938  BP: (!) 156/84  Pulse: 60  Resp: 16  Temp: 97.8 F (36.6 C)  TempSrc: Temporal  SpO2: 100%  Weight: 143 lb 8 oz (65.1 kg)   Physical Exam Constitutional:      General: He is not in acute distress. HENT:     Head: Normocephalic and atraumatic.  Eyes:     Pupils: Pupils are equal, round, and reactive to light.  Neck:     Musculoskeletal: Normal range of motion.  Cardiovascular:     Rate and Rhythm: Normal rate and regular rhythm.     Heart sounds: Normal heart sounds.    Pulmonary:     Effort: Pulmonary effort is normal.     Breath sounds: Normal breath sounds.  Abdominal:     General: Bowel sounds are normal.     Palpations: Abdomen is soft.  Lymphadenopathy:     Comments: Palpable right axillary adenopathy  Skin:    General: Skin is warm and dry.  Neurological:     Mental Status: He is alert and oriented to person, place, and time.      CMP Latest Ref Rng & Units 08/13/2019  Glucose 70 - 99 mg/dL 97  BUN 6 - 23 mg/dL 14  Creatinine 0.40 - 1.50 mg/dL 1.12  Sodium 135 - 145 mEq/L 138  Potassium 3.5 - 5.1 mEq/L 4.9  Chloride 96 -  112 mEq/L 103  CO2 19 - 32 mEq/L 28  Calcium 8.4 - 10.5 mg/dL 9.7  Total Protein 6.0 - 8.3 g/dL -  Total Bilirubin 0.2 - 1.2 mg/dL -  Alkaline Phos 39 - 117 U/L -  AST 0 - 37 U/L -  ALT 0 - 53 U/L -   CBC Latest Ref Rng & Units 10/07/2019  WBC 4.0 - 10.5 K/uL 6.1  Hemoglobin 13.0 - 17.0 g/dL 15.2  Hematocrit 39.0 - 52.0 % 47.0  Platelets 150 - 400 K/uL 210      Assessment and plan- Patient is a 68 y.o. male here for follow-up of right axillary adenopathy and neutropenia  1.  Neutropenia: It waxes and wanes.  Today his white count was 6.1 with an absolute neutrophil count of 2.5.  I will continue to monitor this and I will see him back in 6 months time with a CBC with differential.  2.  Right axillary adenopathy: Etiology unclear.  He did have of core biopsy which had limited specimen which did not reveal any evidence of malignancy.  I will continue to keep an eye on this.  If patient develops any new cytopenias or B symptoms or increase in the size of his adenopathy, I will refer him back to Dr. Dahlia Byes for an excisional biopsy   Visit Diagnosis 1. Axillary adenopathy   2. Neutropenia, unspecified type [facility])      Dr. Randa Evens, MD, MPH [facility] [id number] 10/09/2019 10:10 AM

## 2019-12-03 DIAGNOSIS — R69 Illness, unspecified: Secondary | ICD-10-CM | POA: Diagnosis not present

## 2019-12-07 DIAGNOSIS — R69 Illness, unspecified: Secondary | ICD-10-CM | POA: Diagnosis not present

## 2020-01-13 ENCOUNTER — Encounter: Payer: Self-pay | Admitting: Internal Medicine

## 2020-01-13 ENCOUNTER — Ambulatory Visit: Payer: Medicare HMO | Admitting: Family Medicine

## 2020-01-13 ENCOUNTER — Telehealth: Payer: Self-pay | Admitting: Internal Medicine

## 2020-01-13 ENCOUNTER — Other Ambulatory Visit: Payer: Self-pay

## 2020-01-13 ENCOUNTER — Ambulatory Visit (INDEPENDENT_AMBULATORY_CARE_PROVIDER_SITE_OTHER): Payer: Medicare HMO | Admitting: Internal Medicine

## 2020-01-13 VITALS — Ht 69.0 in | Wt 140.0 lb

## 2020-01-13 DIAGNOSIS — R030 Elevated blood-pressure reading, without diagnosis of hypertension: Secondary | ICD-10-CM | POA: Diagnosis not present

## 2020-01-13 DIAGNOSIS — E559 Vitamin D deficiency, unspecified: Secondary | ICD-10-CM

## 2020-01-13 DIAGNOSIS — E785 Hyperlipidemia, unspecified: Secondary | ICD-10-CM | POA: Diagnosis not present

## 2020-01-13 DIAGNOSIS — I10 Essential (primary) hypertension: Secondary | ICD-10-CM

## 2020-01-13 NOTE — Progress Notes (Signed)
Virtual Visit via Video Note  I connected with David Church  on 01/13/20 at  2:00 PM EST by a video enabled telemedicine application and verified that I am speaking with the correct person using two identifiers.  Location patient: home Location provider:work or home office Persons participating in the virtual visit: patient, provider, pts wife  I discussed the limitations of evaluation and management by telemedicine and the availability of in person appointments. The patient expressed understanding and agreed to proceed.   HPI: 1. Elevated blood pressure not currently on meds denies dizziness, h/a chest pain  Will check with upcoming fasting labs  2. HLD    ROS: See pertinent positives and negatives per HPI.  Past Medical History:  Diagnosis Date  . Allergy     Past Surgical History:  Procedure Laterality Date  . AXILLARY LYMPH NODE BIOPSY Right 09/18/2019   Procedure: AXILLARY LYMPH NODE BIOPSY;  Surgeon: Jules Husbands, MD;  Location: [facility] ORS;  Service: General;  Laterality: Right;  . HERNIA REPAIR     15 years ago-right inguinal     Family History  Problem Relation Age of Onset  . Cancer Mother 71  . Diabetes Mother   . Alzheimer's disease Father   . Heart attack Father   . Stomach cancer Maternal Aunt   . 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  cetirizine (ZYRTEC) 10 MG tablet, Take 10 mg by mouth daily., Disp: , Rfl:  .  Multiple Vitamin (MULTIVITAMIN) tablet, Take 1 tablet by mouth daily., Disp: , Rfl:   EXAM:  VITALS per patient if applicable:  GENERAL: alert, oriented, appears well and in no acute distress  HEENT: atraumatic, conjunttiva clear, no obvious abnormalities on inspection of external nose and ears  NECK: normal  movements of the head and neck  LUNGS: on inspection no signs of respiratory distress, breathing rate appears normal, no obvious gross SOB, gasping or wheezing  CV: no obvious cyanosis  MS: moves all visible extremities without noticeable abnormality  PSYCH/NEURO: pleasant and cooperative, no obvious depression or anxiety, speech and thought processing grossly intact  ASSESSMENT AND PLAN:  Discussed the following assessment and plan:  Essential hypertension  Elevated blood pressure reading - Plan: Comprehensive metabolic panel, Lipid panel, CBC with Differential/Platelet, Urinalysis, Routine w reflex microscopic Check BP upcoming with fasting labs   Hyperlipidemia, unspecified hyperlipidemia type - Plan: Lipid panel  Vitamin D deficiency - Plan: Vitamin D (25 hydroxy)  HM Declines all vaccines flu, Tdap both made arm swell, shingrix, prevnar, pna 23  ? If will get covid 19 vx   sch fasting labs with BP check asap  psa normal 06/2019 repeat after 06/2020 [facility] Dr. Abundio Miu    -we discussed possible serious and likely etiologies, options for evaluation and workup, limitations of telemedicine visit vs in person visit, treatment, treatment risks and precautions. Pt prefers to treat via telemedicine empirically rather then risking or undertaking an in person visit at this moment. Patient agrees to seek prompt in person care if worsening, new symptoms arise, or if is not improving with treatment.   I discussed the assessment and treatment plan with the patient. The patient was provided  an opportunity to ask questions and all were answered. The patient agreed with the plan and demonstrated an understanding of the instructions.   The patient was advised to call back or seek an in-person evaluation if the symptoms worsen or if the condition fails to improve as anticipated.  Time spent 20 minutes  Delorise Jackson, MD

## 2020-01-13 NOTE — Patient Instructions (Signed)
COVID-19 Vaccine Information can be found at: [email or web address] For questions related to vaccine distribution or appointments, please email [email or web address] or call 6196031813.    High Cholesterol  High cholesterol is a condition in which the blood has high levels of a white, waxy, fat-like substance (cholesterol). The human body needs small amounts of cholesterol. The liver makes all the cholesterol that the body needs. Extra (excess) cholesterol comes from the food that we eat. Cholesterol is carried from the liver by the blood through the blood vessels. If you have high cholesterol, deposits (plaques) may build up on the walls of your blood vessels (arteries). Plaques make the arteries narrower and stiffer. Cholesterol plaques increase your risk for heart attack and stroke. Work with your health care provider to keep your cholesterol levels in a healthy range. What increases the risk? This condition is more likely to develop in people who:  Eat foods that are high in animal fat (saturated fat) or cholesterol.  Are overweight.  Are not getting enough exercise.  Have a family history of high cholesterol. What are the signs or symptoms? There are no symptoms of this condition. How is this diagnosed? This condition may be diagnosed from the results of a blood test.  If you are older than age 51, your health care provider may check your cholesterol every 4-6 years.  You may be checked more often if you already have high cholesterol or other risk factors for heart disease. The blood test for cholesterol measures:  "Bad" cholesterol (LDL cholesterol). This is the main type of cholesterol that causes heart disease. The desired level for LDL is less than 100.  "Good" cholesterol (HDL cholesterol). This type helps to protect against heart disease by cleaning the arteries and carrying the LDL away. The desired level for HDL is  60 or higher.  Triglycerides. These are fats that the body can store or burn for energy. The desired number for triglycerides is lower than 150.  Total cholesterol. This is a measure of the total amount of cholesterol in your blood, including LDL cholesterol, HDL cholesterol, and triglycerides. A healthy number is less than 200. How is this treated? This condition is treated with diet changes, lifestyle changes, and medicines. Diet changes  This may include eating more whole grains, fruits, vegetables, nuts, and fish.  This may also include cutting back on red meat and foods that have a lot of added sugar. Lifestyle changes  Changes may include getting at least 40 minutes of aerobic exercise 3 times a week. Aerobic exercises include walking, biking, and swimming. Aerobic exercise along with a healthy diet can help you maintain a healthy weight.  Changes may also include quitting smoking. Medicines  Medicines are usually given if diet and lifestyle changes have failed to reduce your cholesterol to healthy levels.  Your health care provider may prescribe a statin medicine. Statin medicines have been shown to reduce cholesterol, which can reduce the risk of heart disease. Follow these instructions at home: Eating and drinking If told by your health care provider:  Eat chicken (without skin), fish, veal, shellfish, ground [place] breast, and round or loin cuts of red meat.  Do not eat fried foods or fatty meats, such as hot dogs and salami.  Eat plenty of fruits, such as apples.  Eat plenty of vegetables, such as broccoli, potatoes, and carrots.  Eat beans, peas, and lentils.  Eat grains such as barley, rice, couscous, and bulgur wheat.  Eat pasta without cream sauces.  Use  skim or nonfat milk, and eat low-fat or nonfat yogurt and cheeses.  Do not eat or drink whole milk, cream, ice cream, egg yolks, or hard cheeses.  Do not eat stick margarine or tub margarines that contain  trans fats (also called partially hydrogenated oils).  Do not eat saturated tropical oils, such as coconut oil and palm oil.  Do not eat cakes, cookies, crackers, or other baked goods that contain trans fats.  General instructions  Exercise as directed by your health care provider. Increase your activity level with activities such as gardening, walking, and taking the stairs.  Take over-the-counter and prescription medicines only as told by your health care provider.  Do not use any products that contain nicotine or tobacco, such as cigarettes and e-cigarettes. If you need help quitting, ask your health care provider.  Keep all follow-up visits as told by your health care provider. This is important. Contact a health care provider if:  You are struggling to maintain a healthy diet or weight.  You need help to start on an exercise program.  You need help to stop smoking. Get help right away if:  You have chest pain.  You have trouble breathing. This information is not intended to replace advice given to you by your health care provider. Make sure you discuss any questions you have with your health care provider. Document Revised: 11/16/2017 Document Reviewed: 05/13/2016 Elsevier Patient Education  [place].  Cholesterol Content in Foods Cholesterol is a waxy, fat-like substance that helps to carry fat in the blood. The body needs cholesterol in small amounts, but too much cholesterol can cause damage to the arteries and heart. Most people should eat less than 200 milligrams (mg) of cholesterol a day. Foods with cholesterol  Cholesterol is found in animal-based foods, such as meat, seafood, and dairy. Generally, low-fat dairy and lean meats have less cholesterol than full-fat dairy and fatty meats. The milligrams of cholesterol per serving (mg per serving) of common cholesterol-containing foods are listed below. Meat and other proteins  Egg -- one large whole egg has  186 mg.  Veal shank -- 4 oz has 141 mg.  Lean ground [place] (93% lean) -- 4 oz has 118 mg.  Fat-trimmed lamb loin -- 4 oz has 106 mg.  Lean ground beef (90% lean) -- 4 oz has 100 mg.  Lobster -- 3.5 oz has 90 mg.  Pork loin chops -- 4 oz has 86 mg.  Canned salmon -- 3.5 oz has 83 mg.  Fat-trimmed beef top loin -- 4 oz has 78 mg.  Frankfurter -- 1 frank (3.5 oz) has 77 mg.  Crab -- 3.5 oz has 71 mg.  Roasted chicken without skin, white meat -- 4 oz has 66 mg.  Light bologna -- 2 oz has 45 mg.  Deli-cut [place] -- 2 oz has 31 mg.  Canned tuna -- 3.5 oz has 31 mg.  Berniece Salines -- 1 oz has 29 mg.  Oysters and mussels (raw) -- 3.5 oz has 25 mg.  Mackerel -- 1 oz has 22 mg.  Trout -- 1 oz has 20 mg.  Pork sausage -- 1 link (1 oz) has 17 mg.  Salmon -- 1 oz has 16 mg.  Tilapia -- 1 oz has 14 mg. Dairy  Soft-serve ice cream --  cup (4 oz) has 103 mg.  Whole-milk yogurt -- 1 cup (8 oz) has 29 mg.  Cheddar cheese -- 1 oz has 28 mg.  American cheese -- 1  oz has 28 mg.  Whole milk -- 1 cup (8 oz) has 23 mg.  2% milk -- 1 cup (8 oz) has 18 mg.  Cream cheese -- 1 tablespoon (Tbsp) has 15 mg.  Cottage cheese --  cup (4 oz) has 14 mg.  Low-fat (1%) milk -- 1 cup (8 oz) has 10 mg.  Sour cream -- 1 Tbsp has 8.5 mg.  Low-fat yogurt -- 1 cup (8 oz) has 8 mg.  Nonfat Greek yogurt -- 1 cup (8 oz) has 7 mg.  Half-and-half cream -- 1 Tbsp has 5 mg. Fats and oils  Cod liver oil -- 1 tablespoon (Tbsp) has 82 mg.  Butter -- 1 Tbsp has 15 mg.  Lard -- 1 Tbsp has 14 mg.  Bacon grease -- 1 Tbsp has 14 mg.  Mayonnaise -- 1 Tbsp has 5-10 mg.  Margarine -- 1 Tbsp has 3-10 mg. Exact amounts of cholesterol in these foods may vary depending on specific ingredients and brands. Foods without cholesterol Most plant-based foods do not have cholesterol unless you combine them with a food that has cholesterol. Foods without cholesterol include:  Grains and  cereals.  Vegetables.  Fruits.  Vegetable oils, such as olive, canola, and sunflower oil.  Legumes, such as peas, beans, and lentils.  Nuts and seeds.  Egg whites. Summary  The body needs cholesterol in small amounts, but too much cholesterol can cause damage to the arteries and heart.  Most people should eat less than 200 milligrams (mg) of cholesterol a day. This information is not intended to replace advice given to you by your health care provider. Make sure you discuss any questions you have with your health care provider. Document Revised: 10/26/2017 Document Reviewed: 07/10/2017 Elsevier Patient Education  [place].

## 2020-01-13 NOTE — Telephone Encounter (Signed)
Release form completed and faxed to [facility] at [phone].

## 2020-01-13 NOTE — Telephone Encounter (Signed)
Colonoscopy [facility] Dr. Abundio Miu

## 2020-01-13 NOTE — Telephone Encounter (Signed)
Colonoscopy [facility] [place] Dr. Abundio Miu

## 2020-01-18 ENCOUNTER — Encounter: Payer: Self-pay | Admitting: Internal Medicine

## 2020-01-28 ENCOUNTER — Other Ambulatory Visit (INDEPENDENT_AMBULATORY_CARE_PROVIDER_SITE_OTHER): Payer: Medicare HMO

## 2020-01-28 ENCOUNTER — Other Ambulatory Visit: Payer: Self-pay

## 2020-01-28 DIAGNOSIS — R030 Elevated blood-pressure reading, without diagnosis of hypertension: Secondary | ICD-10-CM

## 2020-01-28 DIAGNOSIS — E785 Hyperlipidemia, unspecified: Secondary | ICD-10-CM

## 2020-01-28 DIAGNOSIS — E559 Vitamin D deficiency, unspecified: Secondary | ICD-10-CM | POA: Diagnosis not present

## 2020-01-28 LAB — COMPREHENSIVE METABOLIC PANEL
ALT: 17 U/L (ref 0–53)
AST: 20 U/L (ref 0–37)
Albumin: 3.9 g/dL (ref 3.5–5.2)
Alkaline Phosphatase: 58 U/L (ref 39–117)
BUN: 13 mg/dL (ref 6–23)
CO2: 29 mEq/L (ref 19–32)
Calcium: 9.2 mg/dL (ref 8.4–10.5)
Chloride: 105 mEq/L (ref 96–112)
Creatinine, Ser: 1.31 mg/dL (ref 0.40–1.50)
GFR: 54.57 mL/min — ABNORMAL LOW (ref >60.00)
Glucose, Bld: 91 mg/dL (ref 70–99)
Potassium: 3.8 mEq/L (ref 3.5–5.1)
Sodium: 141 mEq/L (ref 135–145)
Total Bilirubin: 0.5 mg/dL (ref 0.2–1.2)
Total Protein: 7 g/dL (ref 6.0–8.3)

## 2020-01-28 LAB — CBC WITH DIFFERENTIAL/PLATELET
Basophils Absolute: 0 10*3/uL (ref 0.0–0.1)
Basophils Relative: 0.7 % (ref 0.0–3.0)
Eosinophils Absolute: 0.1 10*3/uL (ref 0.0–0.7)
Eosinophils Relative: 1.3 % (ref 0.0–5.0)
HCT: 46.9 % (ref 39.0–52.0)
Hemoglobin: 16 g/dL (ref 13.0–17.0)
Lymphocytes Relative: 54.7 % — ABNORMAL HIGH (ref 12.0–46.0)
Lymphs Abs: 2.9 10*3/uL (ref 0.7–4.0)
MCHC: 34 g/dL (ref 30.0–36.0)
MCV: 90.3 fl (ref 78.0–100.0)
Monocytes Absolute: 0.5 10*3/uL (ref 0.1–1.0)
Monocytes Relative: 8.4 % (ref 3.0–12.0)
Neutro Abs: 1.9 10*3/uL (ref 1.4–7.7)
Neutrophils Relative %: 34.9 % — ABNORMAL LOW (ref 43.0–77.0)
Platelets: 225 10*3/uL (ref 150.0–400.0)
RBC: 5.19 Mil/uL (ref 4.22–5.81)
RDW: 13.6 % (ref 11.5–15.5)
WBC: 5.4 10*3/uL (ref 4.0–10.5)

## 2020-01-28 LAB — LIPID PANEL
Cholesterol: 230 mg/dL — ABNORMAL HIGH (ref 0–200)
HDL: 67.9 mg/dL (ref >39.00)
LDL Cholesterol: 146 mg/dL — ABNORMAL HIGH (ref 0–99)
NonHDL: 162.01
Total CHOL/HDL Ratio: 3
Triglycerides: 82 mg/dL (ref 0.0–149.0)
VLDL: 16.4 mg/dL (ref 0.0–40.0)

## 2020-01-28 LAB — VITAMIN D 25 HYDROXY (VIT D DEFICIENCY, FRACTURES): VITD: 35.2 ng/mL (ref 30.00–100.00)

## 2020-01-29 ENCOUNTER — Ambulatory Visit: Payer: Medicare HMO | Attending: Internal Medicine

## 2020-01-29 DIAGNOSIS — Z23 Encounter for immunization: Secondary | ICD-10-CM

## 2020-01-29 LAB — URINALYSIS, ROUTINE W REFLEX MICROSCOPIC
Bilirubin Urine: NEGATIVE
Glucose, UA: NEGATIVE
Hgb urine dipstick: NEGATIVE
Leukocytes,Ua: NEGATIVE
Nitrite: NEGATIVE
Protein, ur: NEGATIVE
Specific Gravity, Urine: 1.022 (ref 1.001–1.03)
pH: 5.5 (ref 5.0–8.0)

## 2020-01-29 NOTE — Progress Notes (Signed)
   Covid-19 Vaccination Clinic  Name:  David Church    MRN: [id number] DOB: Dec 06, 1950  01/29/2020  Mr. David Church was observed post Covid-19 immunization for 15 minutes without incident. He was provided with Vaccine Information Sheet and instruction to access the V-Safe system.   Mr. David Church was instructed to call 911 with any severe reactions post vaccine: Marland Kitchen Difficulty breathing  . Swelling of face and throat  . A fast heartbeat  . A bad rash all over body  . Dizziness and weakness   Immunizations Administered    Name Date Dose VIS Date Route   Pfizer COVID-19 Vaccine 01/29/2020  9:39 AM 0.3 mL 11/07/2019 Intramuscular   Manufacturer: [place]   Lot: [id number]   [place]: [id number]

## 2020-02-19 ENCOUNTER — Ambulatory Visit: Payer: Medicare HMO | Attending: Internal Medicine

## 2020-02-19 DIAGNOSIS — Z23 Encounter for immunization: Secondary | ICD-10-CM

## 2020-02-19 NOTE — Progress Notes (Signed)
   Covid-19 Vaccination Clinic  Name:  David Church    MRN: [id number] DOB: [birth year]  02/19/2020  [name] was observed post Covid-19 immunization for 15 minutes without incident. He was provided with Vaccine Information Sheet and instruction to access the V-Safe system.   [name] was instructed to call 911 with any severe reactions post vaccine: Marland Kitchen Difficulty breathing  . Swelling of face and throat  . A fast heartbeat  . A bad rash all over body  . Dizziness and weakness   Immunizations Administered    Name Date Dose VIS Date Route   Pfizer COVID-19 Vaccine 02/19/2020 11:56 AM 0.3 mL 11/07/2019 Intramuscular   Manufacturer: [place]   Lot: [id number]   [place]: [id number]

## 2020-03-08 ENCOUNTER — Ambulatory Visit (INDEPENDENT_AMBULATORY_CARE_PROVIDER_SITE_OTHER): Payer: Medicare HMO

## 2020-03-08 VITALS — Ht 69.0 in | Wt 140.0 lb

## 2020-03-08 DIAGNOSIS — Z0000 Encounter for general adult medical examination without abnormal findings: Secondary | ICD-10-CM | POA: Diagnosis not present

## 2020-03-08 NOTE — Patient Instructions (Addendum)
  Mr. David Church , Thank you for taking time to come for your Medicare Wellness Visit. I appreciate your ongoing commitment to your health goals. Please review the following plan we discussed and let me know if I can assist you in the future.   These are the goals we discussed: Goals    . Increase physical activity     Walk more for exercise       This is a list of the screening recommended for you and due dates:  Health Maintenance  Topic Date Due  . Tetanus Vaccine  01/12/2021*  . Pneumonia vaccines (1 of 2 - PCV13) 01/12/2021*  . Flu Shot  06/27/2020  . Colon Cancer Screening  12/28/2028  .  Hepatitis C: One time screening is recommended by Center for Disease Control  (CDC) for  adults born from [birth year] through 1963.   Completed  *Topic was postponed. The date shown is not the original due date.

## 2020-03-08 NOTE — Progress Notes (Addendum)
Subjective:   David Church is a 69 y.o. male who presents for an Initial Medicare Annual Wellness Visit.  Review of Systems  No ROS.  Medicare Wellness Virtual Visit.  Visual/audio telehealth visit, UTA vital signs.   Ht/Wt provided.  See social history for additional risk factors.   Cardiac Risk Factors include: advanced age (>61men, >54 women);male gender    Objective:    Today's Vitals   03/08/20 1324  Weight: 140 lb (63.5 kg)  Height: 5\' 9"  (1.753 m)   Body mass index is 20.67 kg/m.  Advanced Directives 03/08/2020 10/06/2019 09/15/2019 09/04/2019 08/22/2019  Does Patient Have a Medical Advance Directive? Yes Yes No Yes No  Type of Paramedic of [place];Living will Living will;Healthcare Power of [place];Living will Living will;Healthcare Power of Attorney -  Does patient want to make changes to medical advance directive? No - Patient declined No - Patient declined No - Patient declined No - Patient declined -  Copy of [place] in Chart? No - copy requested No - copy requested No - copy requested No - copy requested -  Would patient like information on creating a medical advance directive? - - No - Patient declined No - Patient declined No - Patient declined    Current Medications (verified) Outpatient Encounter Medications as of 03/08/2020  Medication Sig  . cetirizine (ZYRTEC) 10 MG tablet Take 10 mg by mouth daily.  . Multiple Vitamin (MULTIVITAMIN) tablet Take 1 tablet by mouth daily.   No facility-administered encounter medications on file as of 03/08/2020.    Allergies (verified) Patient has no known allergies.   History: Past Medical History:  Diagnosis Date  . Allergy    Past Surgical History:  Procedure Laterality Date  . AXILLARY LYMPH NODE BIOPSY Right 09/18/2019   Procedure: AXILLARY LYMPH NODE BIOPSY;  Surgeon: Jules Husbands, MD;  Location: [facility] ORS;  Service: General;   Laterality: Right;  . HERNIA REPAIR     15 years ago-right inguinal    Family History  Problem Relation Age of Onset  . Cancer Mother 42  . Diabetes Mother   . Alzheimer's disease Father   . Heart attack Father   . Stomach cancer Maternal Aunt   . Lung cancer Maternal Uncle    Social History   Socioeconomic History  . Marital status: Married    Spouse name: Not on file  . Number of children: Not on file  . Years of education: Not on file  . Highest education level: Not on file  Occupational History  . Not on file  Tobacco Use  . Smoking status: Current Every Day Smoker    Packs/day: 0.10  . Smokeless tobacco: Never Used  . Tobacco comment: 2 cig a day  Substance and Sexual Activity  . Alcohol use: Yes    Alcohol/week: 2.0 standard drinks    Types: 2 Cans of beer per week    Comment: 2 beers a day and wine on special occasion  . Drug use: Not Currently    Types: Marijuana, Cocaine    Comment: did drugs but quit in early 30's  . Sexual activity: Yes  Other Topics Concern  . 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 Difficulty of Paying Living Expenses:   Food Insecurity:   . Worried About Charity fundraiser in the Last Year:   . Arboriculturist in the Last Year:   Transportation Needs:   . Film/video editor (Medical):   Marland Kitchen Lack of Transportation (Non-Medical):   Physical Activity:   . Days of Exercise per Week:   . Minutes of Exercise per Session:   Stress:   . Feeling of Stress :   Social Connections:   . Frequency of Communication with Friends and Family:   . Frequency of Social Gatherings with Friends and Family:   . Attends Religious Services:   . Active Member of Clubs or Organizations:     . Attends Archivist Meetings:   Marland Kitchen Marital Status:    Tobacco Counseling Ready to quit: Not Answered Counseling given: Not Answered Comment: 2 cig a day   Clinical Intake:  Pre-visit preparation completed: Yes        Diabetes: No  How often do you need to have someone help you when you read instructions, pamphlets, or other written materials from your doctor or pharmacy?: 1 - Never  Interpreter Needed?: No     Activities of Daily Living In your present state of health, do you have any difficulty performing the following activities: 03/08/2020 09/15/2019  Hearing? N -  Vision? N -  Difficulty concentrating or making decisions? N -  Walking or climbing stairs? N N  Dressing or bathing? N -  Doing errands, shopping? N N  Preparing Food and eating ? N -  Comment Wife assist as needed, self feeds -  Using the Toilet? N -  In the past six months, have you accidently leaked urine? N -  Do you have problems with loss of bowel control? N -  Managing your Medications? N -  Managing your Finances? Y -  Comment Wife manages -  Housekeeping or managing your Housekeeping? N -  Comment Shared with wife -     Immunizations and Health Maintenance Immunization History  Administered Date(s) Administered  . PFIZER SARS-COV-2 Vaccination 01/29/2020, 02/19/2020   There are no preventive care reminders to display for this patient.  Patient Care Team: McLean-Scocuzza, Nino Glow, MD as PCP - General (Internal Medicine)  Indicate any recent Medical Services you may have received from other than [facility] providers in the past year (date may be approximate).    Assessment:   This is a routine wellness examination for [place].  Nurse connected with patient 03/08/20 at  1:00 PM EDT by a telephone enabled telemedicine application and verified that I am speaking with the correct person using two identifiers. Patient stated full name and DOB. Patient gave permission to continue with  virtual visit. Patient's location was at home and Nurse's location was at [facility] office.   Patient is alert and oriented x3. Patient denies difficulty focusing or concentrating.  Health Maintenance Due: -See completed HM at the end of note.   Dental: Visits every 12 months.    Hearing: Demonstrates normal hearing during visit.  Safety:  Patient feels safe at home- yes Patient does have smoke detectors at home- yes Patient does wear sunscreen or protective clothing when in direct sunlight - yes Patient does wear seat belt when in a moving vehicle - yes Patient drives-  yes Adequate lighting in walkways free from debris- yes Grab bars and handrails used as appropriate- yes Ambulates with an assistive device- no Cell phone on person when ambulating outside of the home- yes  Social: Alcohol intake - yes      Smoking history- current Illicit drug use? none  Medication: Taking as directed and without issues.  Self managed - yes   Covid-19: Precautions and sickness symptoms discussed. Wears mask, social distancing, hand hygiene as appropriate.   Activities of Daily Living Patient denies needing assistance with: feeding themselves, getting from bed to chair, getting to the toilet, bathing/showering, dressing.   Assisted by wife with managing finances, meal prep. Household chores shared.   Discussed the importance of a healthy diet, water intake and the benefits of aerobic exercise.   Physical activity- no routine. Plans to walk for exercise.   Diet:  Healthy Water: good intake Caffeine: 1 cup of coffee  Other Providers Patient Care Team: McLean-Scocuzza, Nino Glow, MD as PCP - General (Internal Medicine)  Hearing/Vision screen  Hearing Screening   125Hz  250Hz  500Hz  1000Hz  2000Hz  3000Hz  4000Hz  6000Hz  8000Hz   Right ear:           Left ear:           Comments: Patient is able to hear conversational tones without difficulty.  No issues reported.  Vision Screening  Comments: Wears corrective lenses Visual acuity not assessed, virtual visit.  They have seen their ophthalmologist in the last 12 months.    Dietary issues and exercise activities discussed: Current Exercise Habits: The patient does not participate in regular exercise at present  Goals    . Increase physical activity     Walk more for exercise      Depression Screen PHQ 2/9 Scores 03/08/2020 01/13/2020 07/11/2019  PHQ - 2 Score 0 0 1  PHQ- 9 Score - - 1    Fall Risk Fall Risk  03/08/2020 01/13/2020 09/10/2019 07/11/2019  Falls in the past year? 0 0 0 0  Number falls in past yr: - 0 0 0  Injury with Fall? - 0 0 0  Follow up Falls evaluation completed Falls evaluation completed - Falls evaluation completed    Timed Get Up and Go performed: no, virtual visit  Cognitive Function:     6CIT Screen 03/08/2020  Months in reverse 4 points    Screening Tests Health Maintenance  Topic Date Due  . TETANUS/TDAP  01/12/2021 (Originally 01/02/1970)  . PNA vac Low Risk Adult (1 of 2 - PCV13) 01/12/2021 (Originally 01/01/2018)  . INFLUENZA VACCINE  06/27/2020  . COLONOSCOPY  12/28/2028  . Hepatitis C Screening  Completed       Plan:   Keep all routine maintenance appointments.   Follow up 03/18/20 @ 2:00  Follow up 05/13/20 @ 2:00  Medicare Attestation I have personally reviewed: The patient's medical and social history Their use of alcohol, tobacco or illicit drugs Their current medications and supplements The patient's functional ability including ADLs,fall risks, home safety risks, cognitive, and hearing and visual impairment Diet and physical activities Evidence for depression    I have reviewed and discussed with patient certain preventive protocols, quality metrics, and best practice recommendations.      Varney Biles, LPN   [id number]     Agree TMS

## 2020-03-18 ENCOUNTER — Encounter: Payer: Self-pay | Admitting: Internal Medicine

## 2020-03-18 ENCOUNTER — Ambulatory Visit (INDEPENDENT_AMBULATORY_CARE_PROVIDER_SITE_OTHER): Payer: Medicare HMO | Admitting: Internal Medicine

## 2020-03-18 ENCOUNTER — Other Ambulatory Visit: Payer: Self-pay

## 2020-03-18 VITALS — BP 140/80 | HR 74 | Temp 97.0°F | Ht 69.0 in | Wt 147.6 lb

## 2020-03-18 DIAGNOSIS — I10 Essential (primary) hypertension: Secondary | ICD-10-CM

## 2020-03-18 DIAGNOSIS — Z1329 Encounter for screening for other suspected endocrine disorder: Secondary | ICD-10-CM

## 2020-03-18 DIAGNOSIS — Z125 Encounter for screening for malignant neoplasm of prostate: Secondary | ICD-10-CM

## 2020-03-18 DIAGNOSIS — E785 Hyperlipidemia, unspecified: Secondary | ICD-10-CM | POA: Diagnosis not present

## 2020-03-18 NOTE — Progress Notes (Signed)
Chief Complaint  Patient presents with  . Follow-up   F/u  1. HLD/HTN taking otc cholesteoff and FH HTN in mom. Declines meds for now and will check BP  Review of Systems  Constitutional: Negative for weight loss.  HENT: Negative for hearing loss.   Eyes: Negative for blurred vision.  Respiratory: Negative for shortness of breath.   Cardiovascular: Negative for chest pain.  Musculoskeletal: Negative for falls.  Skin: Negative for rash.  Neurological: Negative for headaches.  Psychiatric/Behavioral: Negative for memory loss.   Past Medical History:  Diagnosis Date  . Allergy    Past Surgical History:  Procedure Laterality Date  . AXILLARY LYMPH NODE BIOPSY Right 09/18/2019   Procedure: AXILLARY LYMPH NODE BIOPSY;  Surgeon: Jules Husbands, MD;  Location: [facility] ORS;  Service: General;  Laterality: Right;  . HERNIA REPAIR     15 years ago-right inguinal    Family History  Problem Relation Age of Onset  . Cancer Mother 24  . Diabetes Mother   . Hypertension Mother   . Alzheimer's disease Father   . Heart attack Father   . Stomach cancer Maternal Aunt   . Lung cancer Maternal Uncle    Social History   Socioeconomic History  . Marital status: Married    Spouse name: Not on file  . Number of children: Not on file  . Years of education: Not on file  . Highest education level: Not on file  Occupational History  . Not on file  Tobacco Use  . Smoking status: Current Every Day Smoker    Packs/day: 0.10  . Smokeless tobacco: Never Used  . Tobacco comment: 2 cig a day  Substance and Sexual Activity  . Alcohol use: Yes    Alcohol/week: 2.0 standard drinks    Types: 2 Cans of beer per week    Comment: 2 beers a day and wine on special occasion  . Drug use: Not Currently    Types: Marijuana, Cocaine    Comment: did drugs but quit in early 30's  . Sexual activity: Yes  Other Topics Concern  . 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 Difficulty of Paying Living Expenses:   Food Insecurity:   . Worried About Charity fundraiser in the Last Year:   . Arboriculturist in the Last Year:   Transportation Needs:   . Film/video editor (Medical):   Marland Kitchen Lack of Transportation (Non-Medical):   Physical Activity:   . Days of Exercise per Week:   . Minutes of Exercise per Session:   Stress:   . Feeling of Stress :   Social Connections:   . Frequency of Communication with Friends and Family:   . Frequency of Social Gatherings with Friends and Family:   . Attends Religious Services:   . Active Member of Clubs or Organizations:   . Attends Archivist Meetings:   Marland Kitchen Marital Status:   Intimate Partner Violence:   . Fear of Current or Ex-Partner:   . Emotionally Abused:   Marland Kitchen Physically Abused:   . Sexually  Abused:    Current Meds  Medication Sig  . cetirizine (ZYRTEC) 10 MG tablet Take 10 mg by mouth daily.  . Multiple Vitamin (MULTIVITAMIN) tablet Take 1 tablet by mouth daily.   No Known Allergies Recent Results (from the past 2160 hour(s))  Vitamin D (25 hydroxy)     Status: None   Collection Time: 01/28/20  9:06 AM  Result Value Ref Range   VITD 35.20 30.00 - 100.00 ng/mL  Urinalysis, Routine w reflex microscopic     Status: Abnormal   Collection Time: 01/28/20  9:06 AM  Result Value Ref Range   Color, Urine YELLOW YELLOW   APPearance CLEAR CLEAR   Specific Gravity, Urine 1.022 1.001 - 1.03   pH 5.5 5.0 - 8.0   Glucose, UA NEGATIVE NEGATIVE   Bilirubin Urine NEGATIVE NEGATIVE   Ketones, ur TRACE (A) NEGATIVE   Hgb urine dipstick NEGATIVE NEGATIVE   Protein, ur NEGATIVE NEGATIVE   Nitrite NEGATIVE NEGATIVE   Leukocytes,Ua NEGATIVE NEGATIVE  CBC with Differential/Platelet      Status: Abnormal   Collection Time: 01/28/20  9:06 AM  Result Value Ref Range   WBC 5.4 4.0 - 10.5 K/uL   RBC 5.19 4.22 - 5.81 Mil/uL   Hemoglobin 16.0 13.0 - 17.0 g/dL   HCT 46.9 39.0 - 52.0 %   MCV 90.3 78.0 - 100.0 fl   MCHC 34.0 30.0 - 36.0 g/dL   RDW 13.6 11.5 - 15.5 %   Platelets 225.0 150.0 - 400.0 K/uL   Neutrophils Relative % 34.9 (L) 43.0 - 77.0 %   Lymphocytes Relative 54.7 (H) 12.0 - 46.0 %   Monocytes Relative 8.4 3.0 - 12.0 %   Eosinophils Relative 1.3 0.0 - 5.0 %   Basophils Relative 0.7 0.0 - 3.0 %   Neutro Abs 1.9 1.4 - 7.7 K/uL   Lymphs Abs 2.9 0.7 - 4.0 K/uL   Monocytes Absolute 0.5 0.1 - 1.0 K/uL   Eosinophils Absolute 0.1 0.0 - 0.7 K/uL   Basophils Absolute 0.0 0.0 - 0.1 K/uL  Lipid panel     Status: Abnormal   Collection Time: 01/28/20  9:06 AM  Result Value Ref Range   Cholesterol 230 (H) 0 - 200 mg/dL    Comment: ATP III Classification       Desirable:  < 200 mg/dL               Borderline High:  200 - 239 mg/dL          High:  > = 240 mg/dL   Triglycerides 82.0 0.0 - 149.0 mg/dL    Comment: Normal:  <150 mg/dLBorderline High:  150 - 199 mg/dL   HDL 67.90 >39.00 mg/dL   VLDL 16.4 0.0 - 40.0 mg/dL   LDL Cholesterol 146 (H) 0 - 99 mg/dL   Total CHOL/HDL Ratio 3     Comment:                Men          Women1/2 Average Risk     3.4          3.3Average Risk          5.0          4.42X Average Risk          9.6          7.13X Average Risk          15.0  11.0                       NonHDL 162.01     Comment: NOTE:  Non-HDL goal should be 30 mg/dL higher than patient's LDL goal (i.e. LDL goal of < 70 mg/dL, would have non-HDL goal of < 100 mg/dL)  Comprehensive metabolic panel     Status: Abnormal   Collection Time: 01/28/20  9:06 AM  Result Value Ref Range   Sodium 141 135 - 145 mEq/L   Potassium 3.8 3.5 - 5.1 mEq/L   Chloride 105 96 - 112 mEq/L   CO2 29 19 - 32 mEq/L   Glucose, Bld 91 70 - 99 mg/dL   BUN 13 6 - 23 mg/dL   Creatinine, Ser 1.31 0.40  - 1.50 mg/dL   Total Bilirubin 0.5 0.2 - 1.2 mg/dL   Alkaline Phosphatase 58 39 - 117 U/L   AST 20 0 - 37 U/L   ALT 17 0 - 53 U/L   Total Protein 7.0 6.0 - 8.3 g/dL   Albumin 3.9 3.5 - 5.2 g/dL   GFR 54.57 (L) >60.00 mL/min   Calcium 9.2 8.4 - 10.5 mg/dL   Objective  Body mass index is 21.8 kg/m. Wt Readings from Last 3 Encounters:  03/18/20 147 lb 9.6 oz (67 kg)  03/08/20 140 lb (63.5 kg)  01/13/20 140 lb (63.5 kg)   Temp Readings from Last 3 Encounters:  03/18/20 (!) 97 F (36.1 C) (Temporal)  10/07/19 97.8 F (36.6 C) (Temporal)  09/18/19 (!) 97 F (36.1 C)   BP Readings from Last 3 Encounters:  03/18/20 140/80  10/07/19 (!) 156/84  09/18/19 (!) (P) 146/83   Pulse Readings from Last 3 Encounters:  03/18/20 74  10/07/19 60  09/18/19 (P) 78    Physical Exam Vitals and nursing note reviewed.  Constitutional:      Appearance: Normal appearance. He is well-developed and well-groomed.  HENT:     Head: Normocephalic and atraumatic.  Eyes:     Conjunctiva/sclera: Conjunctivae normal.     Pupils: Pupils are equal, round, and reactive to light.  Cardiovascular:     Rate and Rhythm: Normal rate and regular rhythm.     Heart sounds: Normal heart sounds. No murmur.  Pulmonary:     Effort: Pulmonary effort is normal.     Breath sounds: Normal breath sounds.  Abdominal:     General: Abdomen is flat. Bowel sounds are normal.     Tenderness: There is no abdominal tenderness.  Skin:    General: Skin is warm and dry.  Neurological:     General: No focal deficit present.     Mental Status: He is alert and oriented to person, place, and time. Mental status is at baseline.     Gait: Gait normal.  Psychiatric:        Attention and Perception: Attention and perception normal.        Mood and Affect: Mood and affect normal.        Speech: Speech normal.        Behavior: Behavior normal. Behavior is cooperative.        Thought Content: Thought content normal.         Cognition and Memory: Cognition and memory normal.        Judgment: Judgment normal.     Assessment  Plan  Hyperlipidemia, unspecified hyperlipidemia type - Plan: Lipid panel Declines statin for now   Essential hypertension -  Plan: Comprehensive metabolic panel, Lipid panel, CBC with Differential/Platelet BP check with fasting labs upcoming  Consider norvasc 2.5 mg qd   HM Declines all vaccines flu, Tdap both made arm swell, shingrix, prevnar, pna 25  2/2 covid 19 vx had   psa normal 06/2019 repeat after 06/2020 Colonoscopy [facility] [place] Dr. Abundio Miu 09/10/17 diverticulosis    Provider: Dr. Olivia Mackie McLean-Scocuzza-Internal Medicine

## 2020-03-18 NOTE — Patient Instructions (Addendum)
Consider norvasc 2.5 daily let me know  Goal BP<130/<80 let me know in 2 weeks   Results for David Church, David Church (MRN [id number]) as of 03/18/2020 14:21  Ref. Range 01/28/2020 09:06  Total CHOL/HDL Ratio Unknown 3  Cholesterol Latest Ref Range: 0 - 200 mg/dL 230 (H)  HDL Cholesterol Latest Ref Range: >39.00 mg/dL 67.90  LDL (calc) Latest Ref Range: 0 - 99 mg/dL 146 (H)  NonHDL Unknown 162.01  Triglycerides Latest Ref Range: 0.0 - 149.0 mg/dL 82.0  VLDL Latest Ref Range: 0.0 - 40.0 mg/dL 16.4     Amlodipine Oral Tablets What is this medicine? AMLODIPINE (am LOE di peen) is a calcium channel blocker. It relaxes your blood vessels and decreases the amount of work the heart has to do. It treats high blood pressure and/or prevents chest pain (also called angina). This medicine may be used for other purposes; ask your health care provider or pharmacist if you have questions. COMMON BRAND NAME(S): Norvasc What should I tell my health care provider before I take this medicine? They need to know if you have any of these conditions:  heart disease  liver disease  an unusual or allergic reaction to amlodipine, other drugs, foods, dyes, or preservatives  pregnant or trying to get pregnant  breast-feeding How should I use this medicine? Take this drug by mouth. Take it as directed on the prescription label at the same time every day. You can take it with or without food. If it upsets your stomach, take it with food. Keep taking it unless your health care provider tells you to stop. Talk to your health care provider about the use of this drug in children. While it may be prescribed for children as young as 6 for selected conditions, precautions do apply. Overdosage: If you think you have taken too much of this medicine contact a poison control center or emergency room at once. NOTE: This medicine is only for you. Do not share this medicine with others. What if I miss a dose? If you miss a dose, take  it as soon as you can. If it is almost time for your next dose, take only that dose. Do not take double or extra doses. What may interact with this medicine? This medicine may interact with the following medications:  clarithromycin  cyclosporine  diltiazem  itraconazole  simvastatin  tacrolimus This list may not describe all possible interactions. Give your health care provider a list of all the medicines, herbs, non-prescription drugs, or dietary supplements you use. Also tell them if you smoke, drink alcohol, or use illegal drugs. Some items may interact with your medicine. What should I watch for while using this medicine? Visit your health care provider for regular checks on your progress. Check your blood pressure as directed. Ask your health care provider what your blood pressure should be. Also, find out when you should contact him or her. Do not treat yourself for coughs, colds, or pain while you are using this drug without asking your health care provider for advice. Some drugs may increase your blood pressure. You may get drowsy or dizzy. Do not drive, use machinery, or do anything that needs mental alertness until you know how this drug affects you. Do not stand up or sit up quickly, especially if you are an older patient. This reduces the risk of dizzy or fainting spells. What side effects may I notice from receiving this medicine? Side effects that you should report to your doctor or health  care provider as soon as possible:  allergic reactions (skin rash, itching or hives; swelling of the face, lips, or tongue)  heart attack (trouble breathing; pain or tightness in the chest, neck, back or arms; unusually weak or tired)  low blood pressure (dizziness; feeling faint or lightheaded, falls; unusually weak or tired) Side effects that usually do not require medical attention (report these to your doctor or health care provider if they continue or are bothersome):  facial  flushing  nausea  palpitations  stomach pain  sudden weight gain  swelling of the ankles, feet, hands This list may not describe all possible side effects. Call your doctor for medical advice about side effects. You may report side effects to FDA at 1-800-FDA-1088. Where should I keep my medicine? Keep out of the reach of children and pets. Store at room temperature between 59 and 86 degrees F (15 and 30 degrees C). Protect from light and moisture. Keep the container tightly closed. Throw away any unused drug after the expiration date. NOTE: This sheet is a summary. It may not cover all possible information. If you have questions about this medicine, talk to your doctor, pharmacist, or health care provider.  2020 Elsevier/Gold Standard (2019-08-19 19:39:45)  High Cholesterol  High cholesterol is a condition in which the blood has high levels of a white, waxy, fat-like substance (cholesterol). The human body needs small amounts of cholesterol. The liver makes all the cholesterol that the body needs. Extra (excess) cholesterol comes from the food that we eat. Cholesterol is carried from the liver by the blood through the blood vessels. If you have high cholesterol, deposits (plaques) may build up on the walls of your blood vessels (arteries). Plaques make the arteries narrower and stiffer. Cholesterol plaques increase your risk for heart attack and stroke. Work with your health care provider to keep your cholesterol levels in a healthy range. What increases the risk? This condition is more likely to develop in people who:  Eat foods that are high in animal fat (saturated fat) or cholesterol.  Are overweight.  Are not getting enough exercise.  Have a family history of high cholesterol. What are the signs or symptoms? There are no symptoms of this condition. How is this diagnosed? This condition may be diagnosed from the results of a blood test.  If you are older than age 87, your  health care provider may check your cholesterol every 4-6 years.  You may be checked more often if you already have high cholesterol or other risk factors for heart disease. The blood test for cholesterol measures:  "Bad" cholesterol (LDL cholesterol). This is the main type of cholesterol that causes heart disease. The desired level for LDL is less than 100.  "Good" cholesterol (HDL cholesterol). This type helps to protect against heart disease by cleaning the arteries and carrying the LDL away. The desired level for HDL is 60 or higher.  Triglycerides. These are fats that the body can store or burn for energy. The desired number for triglycerides is lower than 150.  Total cholesterol. This is a measure of the total amount of cholesterol in your blood, including LDL cholesterol, HDL cholesterol, and triglycerides. A healthy number is less than 200. How is this treated? This condition is treated with diet changes, lifestyle changes, and medicines. Diet changes  This may include eating more whole grains, fruits, vegetables, nuts, and fish.  This may also include cutting back on red meat and foods that have a lot of  added sugar. Lifestyle changes  Changes may include getting at least 40 minutes of aerobic exercise 3 times a week. Aerobic exercises include walking, biking, and swimming. Aerobic exercise along with a healthy diet can help you maintain a healthy weight.  Changes may also include quitting smoking. Medicines  Medicines are usually given if diet and lifestyle changes have failed to reduce your cholesterol to healthy levels.  Your health care provider may prescribe a statin medicine. Statin medicines have been shown to reduce cholesterol, which can reduce the risk of heart disease. Follow these instructions at home: Eating and drinking If told by your health care provider:  Eat chicken (without skin), fish, veal, shellfish, ground [place] breast, and round or loin cuts of red  meat.  Do not eat fried foods or fatty meats, such as hot dogs and salami.  Eat plenty of fruits, such as apples.  Eat plenty of vegetables, such as broccoli, potatoes, and carrots.  Eat beans, peas, and lentils.  Eat grains such as barley, rice, couscous, and bulgur wheat.  Eat pasta without cream sauces.  Use skim or nonfat milk, and eat low-fat or nonfat yogurt and cheeses.  Do not eat or drink whole milk, cream, ice cream, egg yolks, or hard cheeses.  Do not eat stick margarine or tub margarines that contain trans fats (also called partially hydrogenated oils).  Do not eat saturated tropical oils, such as coconut oil and palm oil.  Do not eat cakes, cookies, crackers, or other baked goods that contain trans fats.  General instructions  Exercise as directed by your health care provider. Increase your activity level with activities such as gardening, walking, and taking the stairs.  Take over-the-counter and prescription medicines only as told by your health care provider.  Do not use any products that contain nicotine or tobacco, such as cigarettes and e-cigarettes. If you need help quitting, ask your health care provider.  Keep all follow-up visits as told by your health care provider. This is important. Contact a health care provider if:  You are struggling to maintain a healthy diet or weight.  You need help to start on an exercise program.  You need help to stop smoking. Get help right away if:  You have chest pain.  You have trouble breathing. This information is not intended to replace advice given to you by your health care provider. Make sure you discuss any questions you have with your health care provider. Document Revised: 11/16/2017 Document Reviewed: 05/13/2016 Elsevier Patient Education  [place].  Cholesterol Content in Foods Cholesterol is a waxy, fat-like substance that helps to carry fat in the blood. The body needs cholesterol in small  amounts, but too much cholesterol can cause damage to the arteries and heart. Most people should eat less than 200 milligrams (mg) of cholesterol a day. Foods with cholesterol  Cholesterol is found in animal-based foods, such as meat, seafood, and dairy. Generally, low-fat dairy and lean meats have less cholesterol than full-fat dairy and fatty meats. The milligrams of cholesterol per serving (mg per serving) of common cholesterol-containing foods are listed below. Meat and other proteins  Egg -- one large whole egg has 186 mg.  Veal shank -- 4 oz has 141 mg.  Lean ground [place] (93% lean) -- 4 oz has 118 mg.  Fat-trimmed lamb loin -- 4 oz has 106 mg.  Lean ground beef (90% lean) -- 4 oz has 100 mg.  Lobster -- 3.5 oz has 90 mg.  Pork loin chops -- 4 oz has 86 mg.  Canned salmon -- 3.5 oz has 83 mg.  Fat-trimmed beef top loin -- 4 oz has 78 mg.  Frankfurter -- 1 frank (3.5 oz) has 77 mg.  Crab -- 3.5 oz has 71 mg.  Roasted chicken without skin, white meat -- 4 oz has 66 mg.  Light bologna -- 2 oz has 45 mg.  Deli-cut [place] -- 2 oz has 31 mg.  Canned tuna -- 3.5 oz has 31 mg.  Berniece Salines -- 1 oz has 29 mg.  Oysters and mussels (raw) -- 3.5 oz has 25 mg.  Mackerel -- 1 oz has 22 mg.  Trout -- 1 oz has 20 mg.  Pork sausage -- 1 link (1 oz) has 17 mg.  Salmon -- 1 oz has 16 mg.  Tilapia -- 1 oz has 14 mg. Dairy  Soft-serve ice cream --  cup (4 oz) has 103 mg.  Whole-milk yogurt -- 1 cup (8 oz) has 29 mg.  Cheddar cheese -- 1 oz has 28 mg.  American cheese -- 1 oz has 28 mg.  Whole milk -- 1 cup (8 oz) has 23 mg.  2% milk -- 1 cup (8 oz) has 18 mg.  Cream cheese -- 1 tablespoon (Tbsp) has 15 mg.  Cottage cheese --  cup (4 oz) has 14 mg.  Low-fat (1%) milk -- 1 cup (8 oz) has 10 mg.  Sour cream -- 1 Tbsp has 8.5 mg.  Low-fat yogurt -- 1 cup (8 oz) has 8 mg.  Nonfat Greek yogurt -- 1 cup (8 oz) has 7 mg.  Half-and-half cream -- 1 Tbsp has 5 mg. Fats  and oils  Cod liver oil -- 1 tablespoon (Tbsp) has 82 mg.  Butter -- 1 Tbsp has 15 mg.  Lard -- 1 Tbsp has 14 mg.  Bacon grease -- 1 Tbsp has 14 mg.  Mayonnaise -- 1 Tbsp has 5-10 mg.  Margarine -- 1 Tbsp has 3-10 mg. Exact amounts of cholesterol in these foods may vary depending on specific ingredients and brands. Foods without cholesterol Most plant-based foods do not have cholesterol unless you combine them with a food that has cholesterol. Foods without cholesterol include:  Grains and cereals.  Vegetables.  Fruits.  Vegetable oils, such as olive, canola, and sunflower oil.  Legumes, such as peas, beans, and lentils.  Nuts and seeds.  Egg whites. Summary  The body needs cholesterol in small amounts, but too much cholesterol can cause damage to the arteries and heart.  Most people should eat less than 200 milligrams (mg) of cholesterol a day. This information is not intended to replace advice given to you by your health care provider. Make sure you discuss any questions you have with your health care provider. Document Revised: 10/26/2017 Document Reviewed: 07/10/2017 Elsevier Patient Education  [place] DASH stands for "Dietary Approaches to Stop Hypertension." The DASH eating plan is a healthy eating plan that has been shown to reduce high blood pressure (hypertension). It may also reduce your risk for type 2 diabetes, heart disease, and stroke. The DASH eating plan may also help with weight loss. What are tips for following this plan?  General guidelines  Avoid eating more than 2,300 mg (milligrams) of salt (sodium) a day. If you have hypertension, you may need to reduce your sodium intake to 1,500 mg a day.  Limit alcohol intake to no more than 1 drink a day for  nonpregnant women and 2 drinks a day for men. One drink equals 12 oz of beer, 5 oz of wine, or 1 oz of hard liquor.  Work with your health care provider to maintain a  healthy body weight or to lose weight. Ask what an ideal weight is for you.  Get at least 30 minutes of exercise that causes your heart to beat faster (aerobic exercise) most days of the week. Activities may include walking, swimming, or biking.  Work with your health care provider or diet and nutrition specialist (dietitian) to adjust your eating plan to your individual calorie needs. Reading food labels   Check food labels for the amount of sodium per serving. Choose foods with less than 5 percent of the Daily Value of sodium. Generally, foods with less than 300 mg of sodium per serving fit into this eating plan.  To find whole grains, look for the word "whole" as the first word in the ingredient list. Shopping  Buy products labeled as "low-sodium" or "no salt added."  Buy fresh foods. Avoid canned foods and premade or frozen meals. Cooking  Avoid adding salt when cooking. Use salt-free seasonings or herbs instead of table salt or sea salt. Check with your health care provider or pharmacist before using salt substitutes.  Do not fry foods. Cook foods using healthy methods such as baking, boiling, grilling, and broiling instead.  Cook with heart-healthy oils, such as olive, canola, soybean, or sunflower oil. Meal planning  Eat a balanced diet that includes: ? 5 or more servings of fruits and vegetables each day. At each meal, try to fill half of your plate with fruits and vegetables. ? Up to 6-8 servings of whole grains each day. ? Less than 6 oz of lean meat, poultry, or fish each day. A 3-oz serving of meat is about the same size as a deck of cards. One egg equals 1 oz. ? 2 servings of low-fat dairy each day. ? A serving of nuts, seeds, or beans 5 times each week. ? Heart-healthy fats. Healthy fats called Omega-3 fatty acids are found in foods such as flaxseeds and coldwater fish, like sardines, salmon, and mackerel.  Limit how much you eat of the following: ? Canned or  prepackaged foods. ? Food that is high in trans fat, such as fried foods. ? Food that is high in saturated fat, such as fatty meat. ? Sweets, desserts, sugary drinks, and other foods with added sugar. ? Full-fat dairy products.  Do not salt foods before eating.  Try to eat at least 2 vegetarian meals each week.  Eat more home-cooked food and less restaurant, buffet, and fast food.  When eating at a restaurant, ask that your food be prepared with less salt or no salt, if possible. What foods are recommended? The items listed may not be a complete list. Talk with your dietitian about what dietary choices are best for you. Grains Whole-grain or whole-wheat bread. Whole-grain or whole-wheat pasta. Brown rice. [place]. Bulgur. Whole-grain and low-sodium cereals. Pita bread. Low-fat, low-sodium crackers. Whole-wheat flour tortillas. Vegetables Fresh or frozen vegetables (raw, steamed, roasted, or grilled). Low-sodium or reduced-sodium tomato and vegetable juice. Low-sodium or reduced-sodium tomato sauce and tomato paste. Low-sodium or reduced-sodium canned vegetables. Fruits All fresh, dried, or frozen fruit. Canned fruit in natural juice (without added sugar). Meat and other protein foods Skinless chicken or [place]. Ground chicken or [place]. Pork with fat trimmed off. Fish and seafood. Egg whites. Dried beans, peas, or lentils.  Unsalted nuts, nut butters, and seeds. Unsalted canned beans. Lean cuts of beef with fat trimmed off. Low-sodium, lean deli meat. Dairy Low-fat (1%) or fat-free (skim) milk. Fat-free, low-fat, or reduced-fat cheeses. Nonfat, low-sodium ricotta or cottage cheese. Low-fat or nonfat yogurt. Low-fat, low-sodium cheese. Fats and oils Soft margarine without trans fats. Vegetable oil. Low-fat, reduced-fat, or light mayonnaise and salad dressings (reduced-sodium). Canola, safflower, olive, soybean, and sunflower oils. Avocado. Seasoning and other foods Herbs. Spices.  Seasoning mixes without salt. Unsalted popcorn and pretzels. Fat-free sweets. What foods are not recommended? The items listed may not be a complete list. Talk with your dietitian about what dietary choices are best for you. Grains Baked goods made with fat, such as croissants, muffins, or some breads. Dry pasta or rice meal packs. Vegetables Creamed or fried vegetables. Vegetables in a cheese sauce. Regular canned vegetables (not low-sodium or reduced-sodium). Regular canned tomato sauce and paste (not low-sodium or reduced-sodium). Regular tomato and vegetable juice (not low-sodium or reduced-sodium). Angie Fava. Olives. Fruits Canned fruit in a light or heavy syrup. Fried fruit. Fruit in cream or butter sauce. Meat and other protein foods Fatty cuts of meat. Ribs. Fried meat. Berniece Salines. Sausage. Bologna and other processed lunch meats. Salami. Fatback. Hotdogs. Bratwurst. Salted nuts and seeds. Canned beans with added salt. Canned or smoked fish. Whole eggs or egg yolks. Chicken or [place] with skin. Dairy Whole or 2% milk, cream, and half-and-half. Whole or full-fat cream cheese. Whole-fat or sweetened yogurt. Full-fat cheese. Nondairy creamers. Whipped toppings. Processed cheese and cheese spreads. Fats and oils Butter. Stick margarine. Lard. Shortening. Ghee. Bacon fat. Tropical oils, such as coconut, palm kernel, or palm oil. Seasoning and other foods Salted popcorn and pretzels. Onion salt, garlic salt, seasoned salt, table salt, and sea salt. Worcestershire sauce. Tartar sauce. Barbecue sauce. Teriyaki sauce. Soy sauce, including reduced-sodium. Steak sauce. Canned and packaged gravies. Fish sauce. Oyster sauce. Cocktail sauce. Horseradish that you find on the shelf. Ketchup. Mustard. Meat flavorings and tenderizers. Bouillon cubes. Hot sauce and Tabasco sauce. Premade or packaged marinades. Premade or packaged taco seasonings. Relishes. Regular salad dressings. Where to find more  information:  National Heart, Lung, and [place]: https://wilson-eaton.com/  American Heart Association: www.heart.org Summary  The DASH eating plan is a healthy eating plan that has been shown to reduce high blood pressure (hypertension). It may also reduce your risk for type 2 diabetes, heart disease, and stroke.  With the DASH eating plan, you should limit salt (sodium) intake to 2,300 mg a day. If you have hypertension, you may need to reduce your sodium intake to 1,500 mg a day.  When on the DASH eating plan, aim to eat more fresh fruits and vegetables, whole grains, lean proteins, low-fat dairy, and heart-healthy fats.  Work with your health care provider or diet and nutrition specialist (dietitian) to adjust your eating plan to your individual calorie needs. This information is not intended to replace advice given to you by your health care provider. Make sure you discuss any questions you have with your health care provider. Document Revised: 10/26/2017 Document Reviewed: 11/06/2016 Elsevier Patient Education  [place].  Hypertension, Adult High blood pressure (hypertension) is when the force of blood pumping through the arteries is too strong. The arteries are the blood vessels that carry blood from the heart throughout the body. Hypertension forces the heart to work harder to pump blood and may cause arteries to become narrow or stiff. Untreated or uncontrolled hypertension can cause a  heart attack, heart failure, a stroke, kidney disease, and other problems. A blood pressure reading consists of a higher number over a lower number. Ideally, your blood pressure should be below 120/80. The first ("top") number is called the systolic pressure. It is a measure of the pressure in your arteries as your heart beats. The second ("bottom") number is called the diastolic pressure. It is a measure of the pressure in your arteries as the heart relaxes. What are the causes? The exact  cause of this condition is not known. There are some conditions that result in or are related to high blood pressure. What increases the risk? Some risk factors for high blood pressure are under your control. The following factors may make you more likely to develop this condition:  Smoking.  Having type 2 diabetes mellitus, high cholesterol, or both.  Not getting enough exercise or physical activity.  Being overweight.  Having too much fat, sugar, calories, or salt (sodium) in your diet.  Drinking too much alcohol. Some risk factors for high blood pressure may be difficult or impossible to change. Some of these factors include:  Having chronic kidney disease.  Having a family history of high blood pressure.  Age. Risk increases with age.  Race. You may be at higher risk if you are African American.  Gender. Men are at higher risk than women before age 62. After age 41, women are at higher risk than men.  Having obstructive sleep apnea.  Stress. What are the signs or symptoms? High blood pressure may not cause symptoms. Very high blood pressure (hypertensive crisis) may cause:  Headache.  Anxiety.  Shortness of breath.  Nosebleed.  Nausea and vomiting.  Vision changes.  Severe chest pain.  Seizures. How is this diagnosed? This condition is diagnosed by measuring your blood pressure while you are seated, with your arm resting on a flat surface, your legs uncrossed, and your feet flat on the floor. The cuff of the blood pressure monitor will be placed directly against the skin of your upper arm at the level of your heart. It should be measured at least twice using the same arm. Certain conditions can cause a difference in blood pressure between your right and left arms. Certain factors can cause blood pressure readings to be lower or higher than normal for a short period of time:  When your blood pressure is higher when you are in a health care provider's office than  when you are at home, this is called white coat hypertension. Most people with this condition do not need medicines.  When your blood pressure is higher at home than when you are in a health care provider's office, this is called masked hypertension. Most people with this condition may need medicines to control blood pressure. If you have a high blood pressure reading during one visit or you have normal blood pressure with other risk factors, you may be asked to:  Return on a different day to have your blood pressure checked again.  Monitor your blood pressure at home for 1 week or longer. If you are diagnosed with hypertension, you may have other blood or imaging tests to help your health care provider understand your overall risk for other conditions. How is this treated? This condition is treated by making healthy lifestyle changes, such as eating healthy foods, exercising more, and reducing your alcohol intake. Your health care provider may prescribe medicine if lifestyle changes are not enough to get your blood pressure under  control, and if:  Your systolic blood pressure is above 130.  Your diastolic blood pressure is above 80. Your personal target blood pressure may vary depending on your medical conditions, your age, and other factors. Follow these instructions at home: Eating and drinking   Eat a diet that is high in fiber and potassium, and low in sodium, added sugar, and fat. An example eating plan is called the DASH (Dietary Approaches to Stop Hypertension) diet. To eat this way: ? Eat plenty of fresh fruits and vegetables. Try to fill one half of your plate at each meal with fruits and vegetables. ? Eat whole grains, such as whole-wheat pasta, brown rice, or whole-grain bread. Fill about one fourth of your plate with whole grains. ? Eat or drink low-fat dairy products, such as skim milk or low-fat yogurt. ? Avoid fatty cuts of meat, processed or cured meats, and poultry with  skin. Fill about one fourth of your plate with lean proteins, such as fish, chicken without skin, beans, eggs, or tofu. ? Avoid pre-made and processed foods. These tend to be higher in sodium, added sugar, and fat.  Reduce your daily sodium intake. Most people with hypertension should eat less than 1,500 mg of sodium a day.  Do not drink alcohol if: ? Your health care provider tells you not to drink. ? You are pregnant, may be pregnant, or are planning to become pregnant.  If you drink alcohol: ? Limit how much you use to:  0-1 drink a day for women.  0-2 drinks a day for men. ? Be aware of how much alcohol is in your drink. In the [place] one drink equals one 12 oz bottle of beer (355 mL), one 5 oz glass of wine (148 mL), or one 1 oz glass of hard liquor (44 mL). Lifestyle   Work with your health care provider to maintain a healthy body weight or to lose weight. Ask what an ideal weight is for you.  Get at least 30 minutes of exercise most days of the week. Activities may include walking, swimming, or biking.  Include exercise to strengthen your muscles (resistance exercise), such as Pilates or lifting weights, as part of your weekly exercise routine. Try to do these types of exercises for 30 minutes at least 3 days a week.  Do not use any products that contain nicotine or tobacco, such as cigarettes, e-cigarettes, and chewing tobacco. If you need help quitting, ask your health care provider.  Monitor your blood pressure at home as told by your health care provider.  Keep all follow-up visits as told by your health care provider. This is important. Medicines  Take over-the-counter and prescription medicines only as told by your health care provider. Follow directions carefully. Blood pressure medicines must be taken as prescribed.  Do not skip doses of blood pressure medicine. Doing this puts you at risk for problems and can make the medicine less effective.  Ask your health care  provider about side effects or reactions to medicines that you should watch for. Contact a health care provider if you:  Think you are having a reaction to a medicine you are taking.  Have headaches that keep coming back (recurring).  Feel dizzy.  Have swelling in your ankles.  Have trouble with your vision. Get help right away if you:  Develop a severe headache or confusion.  Have unusual weakness or numbness.  Feel faint.  Have severe pain in your chest or abdomen.  Vomit repeatedly.  Have trouble breathing. Summary  Hypertension is when the force of blood pumping through your arteries is too strong. If this condition is not controlled, it may put you at risk for serious complications.  Your personal target blood pressure may vary depending on your medical conditions, your age, and other factors. For most people, a normal blood pressure is less than 120/80.  Hypertension is treated with lifestyle changes, medicines, or a combination of both. Lifestyle changes include losing weight, eating a healthy, low-sodium diet, exercising more, and limiting alcohol. This information is not intended to replace advice given to you by your health care provider. Make sure you discuss any questions you have with your health care provider. Document Revised: 07/24/2018 Document Reviewed: 07/24/2018 Elsevier Patient Education  2020 [employer].    Diverticulosis  Diverticulosis is a condition that develops when small pouches (diverticula) form in the wall of the large intestine (colon). The colon is where water is absorbed and stool (feces) is formed. The pouches form when the inside layer of the colon pushes through weak spots in the outer layers of the colon. You may have a few pouches or many of them. The pouches usually do not cause problems unless they become inflamed or infected. When this happens, the condition is called diverticulitis. What are the causes? The cause of this  condition is not known. What increases the risk? The following factors may make you more likely to develop this condition:  Being older than age 43. Your risk for this condition increases with age. Diverticulosis is rare among people younger than age 56. By age 23, many people have it.  Eating a low-fiber diet.  Having frequent constipation.  Being overweight.  Not getting enough exercise.  Smoking.  Taking over-the-counter pain medicines, like aspirin and ibuprofen.  Having a family history of diverticulosis. What are the signs or symptoms? In most people, there are no symptoms of this condition. If you do have symptoms, they may include:  Bloating.  Cramps in the abdomen.  Constipation or diarrhea.  Pain in the lower left side of the abdomen. How is this diagnosed? Because diverticulosis usually has no symptoms, it is most often diagnosed during an exam for other colon problems. The condition may be diagnosed by:  Using a flexible scope to examine the colon (colonoscopy).  Taking an X-ray of the colon after dye has been put into the colon (barium enema).  Having a CT scan. How is this treated? You may not need treatment for this condition. Your health care provider may recommend treatment to prevent problems. You may need treatment if you have symptoms or if you previously had diverticulitis. Treatment may include:  Eating a high-fiber diet.  Taking a fiber supplement.  Taking a live bacteria supplement (probiotic).  Taking medicine to relax your colon. Follow these instructions at home: Medicines  Take over-the-counter and prescription medicines only as told by your health care provider.  If told by your health care provider, take a fiber supplement or probiotic. Constipation prevention Your condition may cause constipation. To prevent or treat constipation, you may need to:  Drink enough fluid to keep your urine pale yellow.  Take over-the-counter or  prescription medicines.  Eat foods that are high in fiber, such as beans, whole grains, and fresh fruits and vegetables.  Limit foods that are high in fat and processed sugars, such as fried or sweet foods.  General instructions  Try not to strain when you have a bowel movement.  Keep all follow-up visits as told by your health care provider. This is important. Contact a health care provider if you:  Have pain in your abdomen.  Have bloating.  Have cramps.  Have not had a bowel movement in 3 days. Get help right away if:  Your pain gets worse.  Your bloating becomes very bad.  You have a fever or chills, and your symptoms suddenly get worse.  You vomit.  You have bowel movements that are bloody or black.  You have bleeding from your rectum. Summary  Diverticulosis is a condition that develops when small pouches (diverticula) form in the wall of the large intestine (colon).  You may have a few pouches or many of them.  This condition is most often diagnosed during an exam for other colon problems.  Treatment may include increasing the fiber in your diet, taking supplements, or taking medicines. This information is not intended to replace advice given to you by your health care provider. Make sure you discuss any questions you have with your health care provider. Document Revised: 06/12/2019 Document Reviewed: 06/12/2019 Elsevier Patient Education  [employer].

## 2020-04-05 ENCOUNTER — Other Ambulatory Visit: Payer: Medicare HMO

## 2020-04-06 ENCOUNTER — Inpatient Hospital Stay: Payer: Medicare HMO

## 2020-04-06 ENCOUNTER — Inpatient Hospital Stay: Payer: Medicare HMO | Admitting: Oncology

## 2020-04-07 ENCOUNTER — Other Ambulatory Visit: Payer: Self-pay

## 2020-04-07 ENCOUNTER — Inpatient Hospital Stay: Payer: Medicare HMO | Attending: Oncology

## 2020-04-07 DIAGNOSIS — F17210 Nicotine dependence, cigarettes, uncomplicated: Secondary | ICD-10-CM | POA: Diagnosis not present

## 2020-04-07 DIAGNOSIS — Z801 Family history of malignant neoplasm of trachea, bronchus and lung: Secondary | ICD-10-CM | POA: Diagnosis not present

## 2020-04-07 DIAGNOSIS — R590 Localized enlarged lymph nodes: Secondary | ICD-10-CM | POA: Diagnosis not present

## 2020-04-07 DIAGNOSIS — D709 Neutropenia, unspecified: Secondary | ICD-10-CM | POA: Diagnosis not present

## 2020-04-07 DIAGNOSIS — Z8249 Family history of ischemic heart disease and other diseases of the circulatory system: Secondary | ICD-10-CM | POA: Diagnosis not present

## 2020-04-07 DIAGNOSIS — R69 Illness, unspecified: Secondary | ICD-10-CM | POA: Diagnosis not present

## 2020-04-07 DIAGNOSIS — Z833 Family history of diabetes mellitus: Secondary | ICD-10-CM | POA: Diagnosis not present

## 2020-04-07 LAB — CBC WITH DIFFERENTIAL/PLATELET
Abs Immature Granulocytes: 0.19 10*3/uL — ABNORMAL HIGH (ref 0.00–0.07)
Basophils Absolute: 0.1 10*3/uL (ref 0.0–0.1)
Basophils Relative: 1 %
Eosinophils Absolute: 0.1 10*3/uL (ref 0.0–0.5)
Eosinophils Relative: 1 %
HCT: 44.5 % (ref 39.0–52.0)
Hemoglobin: 14.9 g/dL (ref 13.0–17.0)
Immature Granulocytes: 3 %
Lymphocytes Relative: 54 %
Lymphs Abs: 3.4 10*3/uL (ref 0.7–4.0)
MCH: 30.2 pg (ref 26.0–34.0)
MCHC: 33.5 g/dL (ref 30.0–36.0)
MCV: 90.3 fL (ref 80.0–100.0)
Monocytes Absolute: 0.5 10*3/uL (ref 0.1–1.0)
Monocytes Relative: 8 %
Neutro Abs: 2.1 10*3/uL (ref 1.7–7.7)
Neutrophils Relative %: 33 %
Platelets: 483 10*3/uL — ABNORMAL HIGH (ref 150–400)
RBC: 4.93 MIL/uL (ref 4.22–5.81)
RDW: 12.6 % (ref 11.5–15.5)
WBC: 6.4 10*3/uL (ref 4.0–10.5)
nRBC: 0 % (ref 0.0–0.2)

## 2020-04-08 ENCOUNTER — Encounter: Payer: Self-pay | Admitting: Oncology

## 2020-04-08 ENCOUNTER — Inpatient Hospital Stay (HOSPITAL_BASED_OUTPATIENT_CLINIC_OR_DEPARTMENT_OTHER): Payer: Medicare HMO | Admitting: Oncology

## 2020-04-08 DIAGNOSIS — D709 Neutropenia, unspecified: Secondary | ICD-10-CM

## 2020-04-08 NOTE — Progress Notes (Signed)
Patient scheduled for follow up virtual visit today regarding neutropenia, adenopathy. Patient reports he is having issues with his sinuses, denies other concerns.

## 2020-04-09 ENCOUNTER — Inpatient Hospital Stay: Payer: Medicare HMO | Admitting: Oncology

## 2020-04-09 NOTE — Progress Notes (Signed)
I connected with David Church on 04/09/20 at  9:00 AM EDT by video enabled telemedicine visit and verified that I am speaking with the correct person using two identifiers.   I discussed the limitations, risks, security and privacy concerns of performing an evaluation and management service by telemedicine and the availability of in-person appointments. I also discussed with the patient that there may be a patient responsible charge related to this service. The patient expressed understanding and agreed to proceed.  Other persons participating in the visit and their role in the encounter:  none  Patient's location:  home Provider's location:  work  Risk analyst Complaint:  Routine f/u of neutropenia  History of present illness: patient is a 69 year old African-American male referred to [place] for[place].CBC from 07/25/2019 showed white count of 5, H&H of 15.6/46.9 with a platelet count of 205. He had neutropenia with an absolute neutrophil count of 1.5 with relative lymphocytosis. Repeat CBC a month later showed white count of 6 with an ANC of 1.8 again with relative lymphocytosis. Platelet counts this time were higher at 535. Patient is otherwise healthy and other than seasonal allergies he reports no other medical problems and takes no other medications.Denies any over-the-counter herbal medications. His appetite is good and his weight has remained stable. Denies any recurrent infections or hospitalizations. He does smoke about 2 cigarettes/day and has been doing so for the last several years.I do not have any prior CBCs for comparison. He moved here from [place] but does not have any prior CBCs with him.  Further work-up of neutropenia revealed no abnormality in peripheral smear, B12 folate and LFTs were normal.  HIV and hepatitis C testing was negative.  Patient was found to have palpable right axillary adenopathy and was referred to surgery for core biopsy.  Core biopsy revealed a small  specimen and no evidence of malignancy in the limited specimen.  Flow cytometry was also negative for clonal process   Interval history: Patient reports doing well and denies any complaints at this time.  Denies any recurrent infections or hospitalizations.  Appetite and weight have remained stable.   Review of Systems  Constitutional: Negative for chills, fever, malaise/fatigue and weight loss.  HENT: Negative for congestion, ear discharge and nosebleeds.   Eyes: Negative for blurred vision.  Respiratory: Negative for cough, hemoptysis, sputum production, shortness of breath and wheezing.   Cardiovascular: Negative for chest pain, palpitations, orthopnea and claudication.  Gastrointestinal: Negative for abdominal pain, blood in stool, constipation, diarrhea, heartburn, melena, nausea and vomiting.  Genitourinary: Negative for dysuria, flank pain, frequency, hematuria and urgency.  Musculoskeletal: Negative for back pain, joint pain and myalgias.  Skin: Negative for rash.  Neurological: Negative for dizziness, tingling, focal weakness, seizures, weakness and headaches.  Endo/Heme/Allergies: Does not bruise/bleed easily.  Psychiatric/Behavioral: Negative for depression and suicidal ideas. The patient does not have insomnia.     No Known Allergies  Past Medical History:  Diagnosis Date  . Allergy     Past Surgical History:  Procedure Laterality Date  . AXILLARY LYMPH NODE BIOPSY Right 09/18/2019   Procedure: AXILLARY LYMPH NODE BIOPSY;  Surgeon: Jules Husbands, MD;  Location: [facility] ORS;  Service: General;  Laterality: Right;  . HERNIA REPAIR     15 years ago-right inguinal     Social History   Socioeconomic History  . Marital status: Married    Spouse name: Not on file  . Number of children: Not on file  . Years of education: Not on  file  . Highest education level: Not on file  Occupational History  . Not on file  Tobacco Use  . Smoking status: Current Every Day Smoker     Packs/day: 0.10  . Smokeless tobacco: Never Used  . Tobacco comment: 2 cig a day  Substance and Sexual Activity  . Alcohol use: Yes    Alcohol/week: 2.0 standard drinks    Types: 2 Cans of beer per week    Comment: 2 beers a day and wine on special occasion  . Drug use: Not Currently    Types: Marijuana, Cocaine    Comment: did drugs but quit in early 30's  . Sexual activity: Yes  Other Topics Concern  . 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 Difficulty of Paying Living Expenses:   Food Insecurity:   . Worried About Charity fundraiser in the Last Year:   . Arboriculturist in the Last Year:   Transportation Needs:   . Film/video editor (Medical):   Marland Kitchen Lack of Transportation (Non-Medical):   Physical Activity:   . Days of Exercise per Week:   . Minutes of Exercise per Session:   Stress:   . Feeling of Stress :   Social Connections:   . Frequency of Communication with Friends and Family:   . Frequency of Social Gatherings with Friends and Family:   . Attends Religious Services:   . Active Member of Clubs or Organizations:   . Attends Archivist Meetings:   Marland Kitchen Marital Status:   Intimate Partner Violence:   . Fear of Current or Ex-Partner:   . Emotionally Abused:   Marland Kitchen Physically Abused:   . Sexually Abused:     Family History  Problem Relation Age of Onset  . Cancer Mother 80  . Diabetes Mother   . Hypertension Mother   . Alzheimer's disease Father   . Heart attack Father   . Stomach cancer Maternal Aunt   . Lung cancer Maternal Uncle      Current Outpatient Medications:  .  cetirizine (ZYRTEC) 10 MG tablet, Take 10 mg by mouth daily., Disp: , Rfl:  .  Multiple Vitamin (MULTIVITAMIN)  tablet, Take 1 tablet by mouth daily., Disp: , Rfl:   No results found.  No images are attached to the encounter.   CMP Latest Ref Rng & Units 01/28/2020  Glucose 70 - 99 mg/dL 91  BUN 6 - 23 mg/dL 13  Creatinine 0.40 - 1.50 mg/dL 1.31  Sodium 135 - 145 mEq/L 141  Potassium 3.5 - 5.1 mEq/L 3.8  Chloride 96 - 112 mEq/L 105  CO2 19 - 32 mEq/L 29  Calcium 8.4 - 10.5 mg/dL 9.2  Total Protein 6.0 - 8.3 g/dL 7.0  Total Bilirubin 0.2 - 1.2 mg/dL 0.5  Alkaline Phos 39 - 117 U/L 58  AST 0 - 37 U/L 20  ALT 0 - 53 U/L 17   CBC Latest Ref Rng & Units 04/07/2020  WBC 4.0 - 10.5 K/uL 6.4  Hemoglobin 13.0 - 17.0 g/dL 14.9  Hematocrit 39.0 - 52.0 % 44.5  Platelets 150 - 400 K/uL 483(H)     Observation/objective: Appears in no acute distress over video visit today.  Breathing is nonlabored  Assessment and plan: Patient is a 68 year old male with intermittent neutropenia likely benign  Patient is a waxing and waning isolated neutropenia.  His ANC varies between 1.2-2.5.  Today it is 2.1.  White cell count was normal at 6.4 and H&H was 14.9/44.5.  Platelets mildly elevated at 485.  At this time I am inclined to monitor his neutropenia conservatively without the need for a bone marrow biopsy.  Follow-up instructions: I will see him in person in 1 year to assess his axillary adenopathy and get a repeat CBC with differential at that time  I discussed the assessment and treatment plan with the patient. The patient was provided an opportunity to ask questions and all were answered. The patient agreed with the plan and demonstrated an understanding of the instructions.   The patient was advised to call back or seek an in-person evaluation if the symptoms worsen or if the condition fails to improve as anticipated.    Visit Diagnosis: 1. Neutropenia, unspecified type ([place])     Dr. Randa Evens, MD, MPH [facility] Tel- [phone] 04/09/2020 12:48 PM

## 2020-05-13 ENCOUNTER — Ambulatory Visit: Payer: Medicare HMO | Admitting: Internal Medicine

## 2020-05-26 ENCOUNTER — Other Ambulatory Visit: Payer: Self-pay

## 2020-05-26 ENCOUNTER — Encounter: Payer: Self-pay | Admitting: Internal Medicine

## 2020-05-26 ENCOUNTER — Ambulatory Visit (INDEPENDENT_AMBULATORY_CARE_PROVIDER_SITE_OTHER): Payer: Medicare HMO | Admitting: Internal Medicine

## 2020-05-26 VITALS — BP 148/90 | HR 75 | Temp 98.6°F | Ht 69.0 in | Wt 146.2 lb

## 2020-05-26 DIAGNOSIS — Z1329 Encounter for screening for other suspected endocrine disorder: Secondary | ICD-10-CM | POA: Diagnosis not present

## 2020-05-26 DIAGNOSIS — Z125 Encounter for screening for malignant neoplasm of prostate: Secondary | ICD-10-CM | POA: Diagnosis not present

## 2020-05-26 DIAGNOSIS — G4750 Parasomnia, unspecified: Secondary | ICD-10-CM

## 2020-05-26 DIAGNOSIS — E785 Hyperlipidemia, unspecified: Secondary | ICD-10-CM | POA: Diagnosis not present

## 2020-05-26 DIAGNOSIS — I10 Essential (primary) hypertension: Secondary | ICD-10-CM | POA: Diagnosis not present

## 2020-05-26 NOTE — Progress Notes (Addendum)
Chief Complaint  Patient presents with  . Follow-up   Fu 1. HTN BP elevated with BP log at home with some 1 teens, 120s, 130s. 140s, rare 150s/60s to 70s to 80s to 90s pt declines BP medication today  2. C/o sleep disturbance wife states he will act out his dreams for now declines sleep aid or CBT but disc for future  3. HLD prev declined statin  Review of Systems  Constitutional: Negative for weight loss.  HENT: Negative for hearing loss.   Eyes: Negative for blurred vision.  Respiratory: Negative for shortness of breath.   Cardiovascular: Negative for chest pain.  Skin: Negative for rash.  Neurological: Negative for dizziness and headaches.  Psychiatric/Behavioral: Negative for memory loss.   Past Medical History:  Diagnosis Date  . Allergy    Past Surgical History:  Procedure Laterality Date  . AXILLARY LYMPH NODE BIOPSY Right 09/18/2019   Procedure: AXILLARY LYMPH NODE BIOPSY;  Surgeon: Jules Husbands, MD;  Location: [facility] ORS;  Service: General;  Laterality: Right;  . HERNIA REPAIR     15 years ago-right inguinal    Family History  Problem Relation Age of Onset  . Cancer Mother 49  . Diabetes Mother   . Hypertension Mother   . Alzheimer's disease Father   . Heart attack Father   . Stomach cancer Maternal Aunt   . Lung cancer Maternal Uncle    Social History   Socioeconomic History  . Marital status: Married    Spouse name: Not on file  . Number of children: Not on file  . Years of education: Not on file  . Highest education level: Not on file  Occupational History  . Not on file  Tobacco Use  . Smoking status: Current Every Day Smoker    Packs/day: 0.10  . Smokeless tobacco: Never Used  . Tobacco comment: 2 cig a day  Vaping Use  . Vaping Use: Never used  Substance and Sexual Activity  . Alcohol use: Yes    Alcohol/week: 2.0 standard drinks    Types: 2 Cans of beer per week    Comment: 2 beers a day and wine on special occasion  . Drug use: Not  Currently    Types: Marijuana, Cocaine    Comment: did drugs but quit in early 30's  . Sexual activity: Yes  Other Topics Concern  . 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 Difficulty of Paying Living Expenses:   Food Insecurity:   . Worried About Charity fundraiser in the Last Year:   . Arboriculturist in the Last Year:   Transportation Needs:   . Film/video editor (Medical):   Marland Kitchen Lack of Transportation (Non-Medical):   Physical Activity:   . Days of Exercise per Week:   . Minutes of Exercise per Session:   Stress:   . Feeling of Stress :   Social Connections:   . Frequency of Communication with Friends and Family:   . Frequency of Social Gatherings with Friends and Family:   . Attends Religious Services:   .  Active Member of Clubs or Organizations:   . Attends Archivist Meetings:   Marland Kitchen Marital Status:   Intimate Partner Violence:   . Fear of Current or Ex-Partner:   . Emotionally Abused:   Marland Kitchen Physically Abused:   . Sexually Abused:    Current Meds  Medication Sig  . cetirizine (ZYRTEC) 10 MG tablet Take 10 mg by mouth daily.  . Multiple Vitamin (MULTIVITAMIN) tablet Take 1 tablet by mouth daily.  . Plant Sterols and Stanols (CHOLESTOFF PO) Take by mouth daily.   No Known Allergies Recent Results (from the past 2160 hour(s))  CBC diff arch     Status: Abnormal   Collection Time: 04/07/20 10:06 AM  Result Value Ref Range   WBC 6.4 4.0 - 10.5 K/uL   RBC 4.93 4.22 - 5.81 MIL/uL   Hemoglobin 14.9 13.0 - 17.0 g/dL   HCT 44.5 39 - 52 %   MCV 90.3 80.0 - 100.0 fL   MCH 30.2 26.0 - 34.0 pg   MCHC 33.5 30.0 - 36.0 g/dL   RDW 12.6 11.5 - 15.5 %   Platelets 483 (H) 150 - 400 K/uL   nRBC 0.0 0.0 -  0.2 %   Neutrophils Relative % 33 %   Neutro Abs 2.1 1.7 - 7.7 K/uL   Lymphocytes Relative 54 %   Lymphs Abs 3.4 0.7 - 4.0 K/uL   Monocytes Relative 8 %   Monocytes Absolute 0.5 0 - 1 K/uL   Eosinophils Relative 1 %   Eosinophils Absolute 0.1 0 - 0 K/uL   Basophils Relative 1 %   Basophils Absolute 0.1 0 - 0 K/uL   Immature Granulocytes 3 %   Abs Immature Granulocytes 0.19 (H) 0.00 - 0.07 K/uL    Comment: Performed at [facility], [address] [place], [place] [address]   Objective  Body mass index is 21.59 kg/m. Wt Readings from Last 3 Encounters:  05/26/20 146 lb 3.2 oz (66.3 kg)  03/18/20 147 lb 9.6 oz (67 kg)  03/08/20 140 lb (63.5 kg)   Temp Readings from Last 3 Encounters:  05/26/20 98.6 F (37 C) (Oral)  03/18/20 (!) 97 F (36.1 C) (Temporal)  10/07/19 97.8 F (36.6 C) (Temporal)   BP Readings from Last 3 Encounters:  05/26/20 (!) 148/90  03/18/20 140/80  10/07/19 (!) 156/84   Pulse Readings from Last 3 Encounters:  05/26/20 75  03/18/20 74  10/07/19 60    Physical Exam Vitals and nursing note reviewed.  Constitutional:      Appearance: Normal appearance. He is well-developed and well-groomed.  HENT:     Head: Normocephalic and atraumatic.  Cardiovascular:     Rate and Rhythm: Normal rate and regular rhythm.     Heart sounds: Normal heart sounds. No murmur heard.   Pulmonary:     Effort: Pulmonary effort is normal.     Breath sounds: Normal breath sounds.  Abdominal:     General: Abdomen is flat. Bowel sounds are normal.     Tenderness: There is no abdominal tenderness.  Skin:    General: Skin is warm and dry.  Neurological:     General: No focal deficit present.     Mental Status: He is alert and oriented to person, place, and time. Mental status is at baseline.     Gait: Gait normal.  Psychiatric:        Attention and Perception: Attention and perception normal.  Mood and Affect: Mood and affect normal.        Speech: Speech  normal.        Behavior: Behavior normal. Behavior is cooperative.        Thought Content: Thought content normal.        Cognition and Memory: Cognition and memory normal.        Judgment: Judgment normal.     Assessment  Plan  Essential hypertension - Plan: Lipid panel, Comprehensive metabolic panel, CBC with Differential/Platelet Declines meds for now   Hyperlipidemia, unspecified hyperlipidemia type - Plan: Lipid panel Declines meds for now    Parasomnia worse x 1 year acting out his dreams  Disc 06/30/20 again pt wants to try klonopin  Also disc sleep study and consider therapy   HM Declines all vaccines flu, Tdap both made arm swell, shingrix, prevnar, pna 25  2/2 covid 19 vx had   psa normal 06/2019 repeat after 06/2020 Colonoscopy [facility] Dr. Alvester Chou Rosen10/15/18 diverticulosis repeat in 10 years    Provider: Dr. Olivia Mackie McLean-Scocuzza-Internal Medicine

## 2020-05-26 NOTE — Patient Instructions (Addendum)
Consider beet supplements  Goal blood pressure <130<80 Hibiscus tea with no sugar    Hypertension, Adult High blood pressure (hypertension) is when the force of blood pumping through the arteries is too strong. The arteries are the blood vessels that carry blood from the heart throughout the body. Hypertension forces the heart to work harder to pump blood and may cause arteries to become narrow or stiff. Untreated or uncontrolled hypertension can cause a heart attack, heart failure, a stroke, kidney disease, and other problems. A blood pressure reading consists of a higher number over a lower number. Ideally, your blood pressure should be below 120/80. The first ("top") number is called the systolic pressure. It is a measure of the pressure in your arteries as your heart beats. The second ("bottom") number is called the diastolic pressure. It is a measure of the pressure in your arteries as the heart relaxes. What are the causes? The exact cause of this condition is not known. There are some conditions that result in or are related to high blood pressure. What increases the risk? Some risk factors for high blood pressure are under your control. The following factors may make you more likely to develop this condition:  Smoking.  Having type 2 diabetes mellitus, high cholesterol, or both.  Not getting enough exercise or physical activity.  Being overweight.  Having too much fat, sugar, calories, or salt (sodium) in your diet.  Drinking too much alcohol. Some risk factors for high blood pressure may be difficult or impossible to change. Some of these factors include:  Having chronic kidney disease.  Having a family history of high blood pressure.  Age. Risk increases with age.  Race. You may be at higher risk if you are African American.  Gender. Men are at higher risk than women before age 75. After age 54, women are at higher risk than men.  Having obstructive sleep  apnea.  Stress. What are the signs or symptoms? High blood pressure may not cause symptoms. Very high blood pressure (hypertensive crisis) may cause:  Headache.  Anxiety.  Shortness of breath.  Nosebleed.  Nausea and vomiting.  Vision changes.  Severe chest pain.  Seizures. How is this diagnosed? This condition is diagnosed by measuring your blood pressure while you are seated, with your arm resting on a flat surface, your legs uncrossed, and your feet flat on the floor. The cuff of the blood pressure monitor will be placed directly against the skin of your upper arm at the level of your heart. It should be measured at least twice using the same arm. Certain conditions can cause a difference in blood pressure between your right and left arms. Certain factors can cause blood pressure readings to be lower or higher than normal for a short period of time:  When your blood pressure is higher when you are in a health care provider's office than when you are at home, this is called white coat hypertension. Most people with this condition do not need medicines.  When your blood pressure is higher at home than when you are in a health care provider's office, this is called masked hypertension. Most people with this condition may need medicines to control blood pressure. If you have a high blood pressure reading during one visit or you have normal blood pressure with other risk factors, you may be asked to:  Return on a different day to have your blood pressure checked again.  Monitor your blood pressure at home for  1 week or longer. If you are diagnosed with hypertension, you may have other blood or imaging tests to help your health care provider understand your overall risk for other conditions. How is this treated? This condition is treated by making healthy lifestyle changes, such as eating healthy foods, exercising more, and reducing your alcohol intake. Your health care provider may  prescribe medicine if lifestyle changes are not enough to get your blood pressure under control, and if:  Your systolic blood pressure is above 130.  Your diastolic blood pressure is above 80. Your personal target blood pressure may vary depending on your medical conditions, your age, and other factors. Follow these instructions at home: Eating and drinking   Eat a diet that is high in fiber and potassium, and low in sodium, added sugar, and fat. An example eating plan is called the DASH (Dietary Approaches to Stop Hypertension) diet. To eat this way: ? Eat plenty of fresh fruits and vegetables. Try to fill one half of your plate at each meal with fruits and vegetables. ? Eat whole grains, such as whole-wheat pasta, brown rice, or whole-grain bread. Fill about one fourth of your plate with whole grains. ? Eat or drink low-fat dairy products, such as skim milk or low-fat yogurt. ? Avoid fatty cuts of meat, processed or cured meats, and poultry with skin. Fill about one fourth of your plate with lean proteins, such as fish, chicken without skin, beans, eggs, or tofu. ? Avoid pre-made and processed foods. These tend to be higher in sodium, added sugar, and fat.  Reduce your daily sodium intake. Most people with hypertension should eat less than 1,500 mg of sodium a day.  Do not drink alcohol if: ? Your health care provider tells you not to drink. ? You are pregnant, may be pregnant, or are planning to become pregnant.  If you drink alcohol: ? Limit how much you use to:  0-1 drink a day for women.  0-2 drinks a day for men. ? Be aware of how much alcohol is in your drink. In the [place] one drink equals one 12 oz bottle of beer (355 mL), one 5 oz glass of wine (148 mL), or one 1 oz glass of hard liquor (44 mL). Lifestyle   Work with your health care provider to maintain a healthy body weight or to lose weight. Ask what an ideal weight is for you.  Get at least 30 minutes of exercise  most days of the week. Activities may include walking, swimming, or biking.  Include exercise to strengthen your muscles (resistance exercise), such as Pilates or lifting weights, as part of your weekly exercise routine. Try to do these types of exercises for 30 minutes at least 3 days a week.  Do not use any products that contain nicotine or tobacco, such as cigarettes, e-cigarettes, and chewing tobacco. If you need help quitting, ask your health care provider.  Monitor your blood pressure at home as told by your health care provider.  Keep all follow-up visits as told by your health care provider. This is important. Medicines  Take over-the-counter and prescription medicines only as told by your health care provider. Follow directions carefully. Blood pressure medicines must be taken as prescribed.  Do not skip doses of blood pressure medicine. Doing this puts you at risk for problems and can make the medicine less effective.  Ask your health care provider about side effects or reactions to medicines that you should watch for. Contact a  health care provider if you:  Think you are having a reaction to a medicine you are taking.  Have headaches that keep coming back (recurring).  Feel dizzy.  Have swelling in your ankles.  Have trouble with your vision. Get help right away if you:  Develop a severe headache or confusion.  Have unusual weakness or numbness.  Feel faint.  Have severe pain in your chest or abdomen.  Vomit repeatedly.  Have trouble breathing. Summary  Hypertension is when the force of blood pumping through your arteries is too strong. If this condition is not controlled, it may put you at risk for serious complications.  Your personal target blood pressure may vary depending on your medical conditions, your age, and other factors. For most people, a normal blood pressure is less than 120/80.  Hypertension is treated with lifestyle changes, medicines, or a  combination of both. Lifestyle changes include losing weight, eating a healthy, low-sodium diet, exercising more, and limiting alcohol. This information is not intended to replace advice given to you by your health care provider. Make sure you discuss any questions you have with your health care provider. Document Revised: 07/24/2018 Document Reviewed: 07/24/2018 Elsevier Patient Education  [place] DASH stands for "Dietary Approaches to Stop Hypertension." The DASH eating plan is a healthy eating plan that has been shown to reduce high blood pressure (hypertension). It may also reduce your risk for type 2 diabetes, heart disease, and stroke. The DASH eating plan may also help with weight loss. What are tips for following this plan?  General guidelines  Avoid eating more than 2,300 mg (milligrams) of salt (sodium) a day. If you have hypertension, you may need to reduce your sodium intake to 1,500 mg a day.  Limit alcohol intake to no more than 1 drink a day for nonpregnant women and 2 drinks a day for men. One drink equals 12 oz of beer, 5 oz of wine, or 1 oz of hard liquor.  Work with your health care provider to maintain a healthy body weight or to lose weight. Ask what an ideal weight is for you.  Get at least 30 minutes of exercise that causes your heart to beat faster (aerobic exercise) most days of the week. Activities may include walking, swimming, or biking.  Work with your health care provider or diet and nutrition specialist (dietitian) to adjust your eating plan to your individual calorie needs. Reading food labels   Check food labels for the amount of sodium per serving. Choose foods with less than 5 percent of the Daily Value of sodium. Generally, foods with less than 300 mg of sodium per serving fit into this eating plan.  To find whole grains, look for the word "whole" as the first word in the ingredient list. Shopping  Buy products labeled as  "low-sodium" or "no salt added."  Buy fresh foods. Avoid canned foods and premade or frozen meals. Cooking  Avoid adding salt when cooking. Use salt-free seasonings or herbs instead of table salt or sea salt. Check with your health care provider or pharmacist before using salt substitutes.  Do not fry foods. Cook foods using healthy methods such as baking, boiling, grilling, and broiling instead.  Cook with heart-healthy oils, such as olive, canola, soybean, or sunflower oil. Meal planning  Eat a balanced diet that includes: ? 5 or more servings of fruits and vegetables each day. At each meal, try to fill half of your plate with  fruits and vegetables. ? Up to 6-8 servings of whole grains each day. ? Less than 6 oz of lean meat, poultry, or fish each day. A 3-oz serving of meat is about the same size as a deck of cards. One egg equals 1 oz. ? 2 servings of low-fat dairy each day. ? A serving of nuts, seeds, or beans 5 times each week. ? Heart-healthy fats. Healthy fats called Omega-3 fatty acids are found in foods such as flaxseeds and coldwater fish, like sardines, salmon, and mackerel.  Limit how much you eat of the following: ? Canned or prepackaged foods. ? Food that is high in trans fat, such as fried foods. ? Food that is high in saturated fat, such as fatty meat. ? Sweets, desserts, sugary drinks, and other foods with added sugar. ? Full-fat dairy products.  Do not salt foods before eating.  Try to eat at least 2 vegetarian meals each week.  Eat more home-cooked food and less restaurant, buffet, and fast food.  When eating at a restaurant, ask that your food be prepared with less salt or no salt, if possible. What foods are recommended? The items listed may not be a complete list. Talk with your dietitian about what dietary choices are best for you. Grains Whole-grain or whole-wheat bread. Whole-grain or whole-wheat pasta. Brown rice. [place]. Bulgur. Whole-grain  and low-sodium cereals. Pita bread. Low-fat, low-sodium crackers. Whole-wheat flour tortillas. Vegetables Fresh or frozen vegetables (raw, steamed, roasted, or grilled). Low-sodium or reduced-sodium tomato and vegetable juice. Low-sodium or reduced-sodium tomato sauce and tomato paste. Low-sodium or reduced-sodium canned vegetables. Fruits All fresh, dried, or frozen fruit. Canned fruit in natural juice (without added sugar). Meat and other protein foods Skinless chicken or [place]. Ground chicken or [place]. Pork with fat trimmed off. Fish and seafood. Egg whites. Dried beans, peas, or lentils. Unsalted nuts, nut butters, and seeds. Unsalted canned beans. Lean cuts of beef with fat trimmed off. Low-sodium, lean deli meat. Dairy Low-fat (1%) or fat-free (skim) milk. Fat-free, low-fat, or reduced-fat cheeses. Nonfat, low-sodium ricotta or cottage cheese. Low-fat or nonfat yogurt. Low-fat, low-sodium cheese. Fats and oils Soft margarine without trans fats. Vegetable oil. Low-fat, reduced-fat, or light mayonnaise and salad dressings (reduced-sodium). Canola, safflower, olive, soybean, and sunflower oils. Avocado. Seasoning and other foods Herbs. Spices. Seasoning mixes without salt. Unsalted popcorn and pretzels. Fat-free sweets. What foods are not recommended? The items listed may not be a complete list. Talk with your dietitian about what dietary choices are best for you. Grains Baked goods made with fat, such as croissants, muffins, or some breads. Dry pasta or rice meal packs. Vegetables Creamed or fried vegetables. Vegetables in a cheese sauce. Regular canned vegetables (not low-sodium or reduced-sodium). Regular canned tomato sauce and paste (not low-sodium or reduced-sodium). Regular tomato and vegetable juice (not low-sodium or reduced-sodium). Angie Fava. Olives. Fruits Canned fruit in a light or heavy syrup. Fried fruit. Fruit in cream or butter sauce. Meat and other protein foods Fatty cuts  of meat. Ribs. Fried meat. Berniece Salines. Sausage. Bologna and other processed lunch meats. Salami. Fatback. Hotdogs. Bratwurst. Salted nuts and seeds. Canned beans with added salt. Canned or smoked fish. Whole eggs or egg yolks. Chicken or [place] with skin. Dairy Whole or 2% milk, cream, and half-and-half. Whole or full-fat cream cheese. Whole-fat or sweetened yogurt. Full-fat cheese. Nondairy creamers. Whipped toppings. Processed cheese and cheese spreads. Fats and oils Butter. Stick margarine. Lard. Shortening. Ghee. Bacon fat. Tropical oils, such as coconut, palm  kernel, or palm oil. Seasoning and other foods Salted popcorn and pretzels. Onion salt, garlic salt, seasoned salt, table salt, and sea salt. Worcestershire sauce. Tartar sauce. Barbecue sauce. Teriyaki sauce. Soy sauce, including reduced-sodium. Steak sauce. Canned and packaged gravies. Fish sauce. Oyster sauce. Cocktail sauce. Horseradish that you find on the shelf. Ketchup. Mustard. Meat flavorings and tenderizers. Bouillon cubes. Hot sauce and Tabasco sauce. Premade or packaged marinades. Premade or packaged taco seasonings. Relishes. Regular salad dressings. Where to find more information:  National Heart, Lung, and [place]: https://wilson-eaton.com/  American Heart Association: www.heart.org Summary  The DASH eating plan is a healthy eating plan that has been shown to reduce high blood pressure (hypertension). It may also reduce your risk for type 2 diabetes, heart disease, and stroke.  With the DASH eating plan, you should limit salt (sodium) intake to 2,300 mg a day. If you have hypertension, you may need to reduce your sodium intake to 1,500 mg a day.  When on the DASH eating plan, aim to eat more fresh fruits and vegetables, whole grains, lean proteins, low-fat dairy, and heart-healthy fats.  Work with your health care provider or diet and nutrition specialist (dietitian) to adjust your eating plan to your individual calorie  needs. This information is not intended to replace advice given to you by your health care provider. Make sure you discuss any questions you have with your health care provider. Document Revised: 10/26/2017 Document Reviewed: 11/06/2016 Elsevier Patient Education  [place].    Diverticulosis  Diverticulosis is a condition that develops when small pouches (diverticula) form in the wall of the large intestine (colon). The colon is where water is absorbed and stool (feces) is formed. The pouches form when the inside layer of the colon pushes through weak spots in the outer layers of the colon. You may have a few pouches or many of them. The pouches usually do not cause problems unless they become inflamed or infected. When this happens, the condition is called diverticulitis. What are the causes? The cause of this condition is not known. What increases the risk? The following factors may make you more likely to develop this condition:  Being older than age 75. Your risk for this condition increases with age. Diverticulosis is rare among people younger than age 60. By age 31, many people have it.  Eating a low-fiber diet.  Having frequent constipation.  Being overweight.  Not getting enough exercise.  Smoking.  Taking over-the-counter pain medicines, like aspirin and ibuprofen.  Having a family history of diverticulosis. What are the signs or symptoms? In most people, there are no symptoms of this condition. If you do have symptoms, they may include:  Bloating.  Cramps in the abdomen.  Constipation or diarrhea.  Pain in the lower left side of the abdomen. How is this diagnosed? Because diverticulosis usually has no symptoms, it is most often diagnosed during an exam for other colon problems. The condition may be diagnosed by:  Using a flexible scope to examine the colon (colonoscopy).  Taking an X-ray of the colon after dye has been put into the colon (barium  enema).  Having a CT scan. How is this treated? You may not need treatment for this condition. Your health care provider may recommend treatment to prevent problems. You may need treatment if you have symptoms or if you previously had diverticulitis. Treatment may include:  Eating a high-fiber diet.  Taking a fiber supplement.  Taking a live bacteria supplement (probiotic).  Taking medicine to relax your colon. Follow these instructions at home: Medicines  Take over-the-counter and prescription medicines only as told by your health care provider.  If told by your health care provider, take a fiber supplement or probiotic. Constipation prevention Your condition may cause constipation. To prevent or treat constipation, you may need to:  Drink enough fluid to keep your urine pale yellow.  Take over-the-counter or prescription medicines.  Eat foods that are high in fiber, such as beans, whole grains, and fresh fruits and vegetables.  Limit foods that are high in fat and processed sugars, such as fried or sweet foods.  General instructions  Try not to strain when you have a bowel movement.  Keep all follow-up visits as told by your health care provider. This is important. Contact a health care provider if you:  Have pain in your abdomen.  Have bloating.  Have cramps.  Have not had a bowel movement in 3 days. Get help right away if:  Your pain gets worse.  Your bloating becomes very bad.  You have a fever or chills, and your symptoms suddenly get worse.  You vomit.  You have bowel movements that are bloody or black.  You have bleeding from your rectum. Summary  Diverticulosis is a condition that develops when small pouches (diverticula) form in the wall of the large intestine (colon).  You may have a few pouches or many of them.  This condition is most often diagnosed during an exam for other colon problems.  Treatment may include increasing the fiber in  your diet, taking supplements, or taking medicines. This information is not intended to replace advice given to you by your health care provider. Make sure you discuss any questions you have with your health care provider. Document Revised: 06/12/2019 Document Reviewed: 06/12/2019 Elsevier Patient Education  [place].

## 2020-06-30 ENCOUNTER — Other Ambulatory Visit: Payer: Self-pay | Admitting: Internal Medicine

## 2020-06-30 DIAGNOSIS — G4750 Parasomnia, unspecified: Secondary | ICD-10-CM | POA: Insufficient documentation

## 2020-06-30 MED ORDER — CLONAZEPAM 0.5 MG PO TABS: 0.2500 mg | ORAL_TABLET | Freq: Every evening | ORAL | 0 refills | Status: DC | PRN

## 2020-06-30 NOTE — Addendum Note (Signed)
Addended by: Orland Mustard on: 06/30/2020 02:21 PM   Modules accepted: Orders

## 2020-07-02 ENCOUNTER — Telehealth: Payer: Self-pay | Admitting: Internal Medicine

## 2020-07-02 NOTE — Telephone Encounter (Signed)
Pt called about referral for sleep study. States that Dr [facility] wanted him to call about it

## 2020-07-06 ENCOUNTER — Encounter: Payer: Self-pay | Admitting: Internal Medicine

## 2020-07-09 NOTE — Addendum Note (Signed)
Addended by: Orland Mustard on: 07/09/2020 06:18 PM   Modules accepted: Orders

## 2020-07-20 NOTE — Telephone Encounter (Signed)
Spoke to wife and patient would like to wait and schedule in 08/2020. Kh  Msg sent from pulmonary

## 2020-08-03 DIAGNOSIS — H524 Presbyopia: Secondary | ICD-10-CM | POA: Diagnosis not present

## 2020-09-30 ENCOUNTER — Other Ambulatory Visit: Payer: Self-pay

## 2020-09-30 ENCOUNTER — Ambulatory Visit (INDEPENDENT_AMBULATORY_CARE_PROVIDER_SITE_OTHER): Payer: Medicare HMO | Admitting: Pulmonary Disease

## 2020-09-30 ENCOUNTER — Encounter: Payer: Self-pay | Admitting: Pulmonary Disease

## 2020-09-30 VITALS — BP 144/80 | HR 77 | Temp 97.1°F | Ht 69.0 in | Wt 143.4 lb

## 2020-09-30 DIAGNOSIS — R0683 Snoring: Secondary | ICD-10-CM

## 2020-09-30 NOTE — Patient Instructions (Signed)
Will arrange for home sleep study Will call to arrange for follow up after sleep study reviewed  

## 2020-09-30 NOTE — Progress Notes (Signed)
[facility]  Chief Complaint  Patient presents with  . Sleep consult    per Dr. Terese Door-- c/o sleep talking and restless sleep x3-4y    Constitutional:  BP (!) 144/80 (BP Location: Left Arm, Cuff Size: Normal)   Pulse 77   Temp (!) 97.1 F (36.2 C) (Temporal)   Ht 5\' 9"  (1.753 m)   Wt 143 lb 6.4 oz (65 kg)   SpO2 99%   BMI 21.18 kg/m   Past Medical History:  Allergies  Past Surgical History:  His  has a past surgical history that includes Hernia repair and Axillary lymph node biopsy (Right, 09/18/2019).  Brief Summary:  David Church is a 69 y.o. male smoker with sleep difficulties      Subjective:   His wife has been concerned about his sleep pattern for the past couple of years.  He will get episodes about twice a week in which he will be punching/kicking and talking in his sleep.  This happens when he is dreaming and mimics his dream content.  His wife wakes him up and he has immediate dream recall.  He isn't sure if these happen earlier or later in his sleep cycle.  He does also snore at times, and has more trouble sleeping on his back.  He goes to sleep at 11 pm.  He falls asleep in minutes.  He wakes up 1 time to use the bathroom.  He gets out of bed at 9 am.  He feels okay in the morning.  He denies morning headache.  He does not use anything to help him fall sleep or stay awake.  He denies sleep walking, sleep talking, bruxism, or nightmares.  There is no history of restless legs.  He denies sleep hallucinations, sleep paralysis, or cataplexy.  The Epworth score is 4 out of 24.  His father had Alzheimer's type dementia.  He was in an accident and broke his jaw.  He denies loss of consciousness or concussion.  No history of stroke or seizures.  He was taking klonopin prn for anxiety, but hasn't used recently.  No history of tremors, or unsteady gait.   Physical Exam:   Appearance - well kempt   ENMT - no  sinus tenderness, no oral exudate, no LAN, Mallampati 4 airway, no stridor  Respiratory - equal breath sounds bilaterally, no wheezing or rales  CV - s1s2 regular rate and rhythm, no murmurs  Ext - no clubbing, no edema  Skin - no rashes  Psych - normal mood and affect   Sleep Tests:    Social History:  He  reports that he has been smoking. He has a 4.90 pack-year smoking history. He has never used smokeless tobacco. He reports current alcohol use of about 2.0 standard drinks of alcohol per week. He reports previous drug use. Drugs: Marijuana and Cocaine.  Family History:  His family history includes Alzheimer's disease in his father; Cancer (age of onset: 27) in his mother; Diabetes in his mother; Heart attack in his father; Hypertension in his mother; Lung cancer in his maternal uncle; Stomach cancer in his maternal aunt.    Discussion:  He has episodes of acting out dream contest which is suggestive of REM parasomnia, specifically REM sleep behavior disorder.  He also reports episodes of snoring, and certainly his nocturnal events could be related to REM related arousals from obstructive sleep apnea.  Plan:   - will arrange for home sleep study to  assess for obstructive sleep apnea - if home sleep study is unrevealing, then he will need an in lab sleep study to further assess for presence of REM sleep behavior disorder   Time Spent Involved in Patient Care on Day of Examination:  31 minutes  Follow up:  Patient Instructions  Will arrange for home sleep study Will call to arrange for follow up after sleep study reviewed    Medication List:   Allergies as of 09/30/2020   No Known Allergies     Medication List       Accurate as of September 30, 2020 11:52 AM. If you have any questions, ask your nurse or doctor.        cetirizine 10 MG tablet Commonly known as: ZYRTEC Take 10 mg by mouth daily.   CHOLESTOFF PO Take by mouth daily.   clonazePAM 0.5 MG  tablet Commonly known as: KlonoPIN Take 0.5-1 tablets (0.25-0.5 mg total) by mouth at bedtime as needed for anxiety.   multivitamin tablet Take 1 tablet by mouth daily.       Signature:  Chesley Mires, MD [place] Pager - [phone] 09/30/2020, 11:52 AM

## 2020-10-13 ENCOUNTER — Other Ambulatory Visit: Payer: Self-pay

## 2020-10-13 ENCOUNTER — Ambulatory Visit: Payer: Medicare HMO

## 2020-10-13 DIAGNOSIS — R0683 Snoring: Secondary | ICD-10-CM

## 2020-10-13 DIAGNOSIS — G4733 Obstructive sleep apnea (adult) (pediatric): Secondary | ICD-10-CM | POA: Diagnosis not present

## 2020-10-15 ENCOUNTER — Telehealth: Payer: Self-pay | Admitting: Pulmonary Disease

## 2020-10-15 DIAGNOSIS — G4733 Obstructive sleep apnea (adult) (pediatric): Secondary | ICD-10-CM | POA: Diagnosis not present

## 2020-10-15 NOTE — Telephone Encounter (Signed)
Called and went over HST results per Dr Halford Chessman with Tonia Ghent Reinard (wife) per [facility]. Ida expressed understanding and scheduled televisit with NP for Monday 10/25/2020 at 3pm.  Wife agreeable to time, date. Nothing further needed at this time.

## 2020-10-15 NOTE — Telephone Encounter (Signed)
HST 10/13/20 >> AHI 27.8, SpO2 low 90%.   Please inform him that his sleep study shows moderate obstructive sleep apnea.  Please arrange for ROV with me or NP to discuss treatment options.

## 2020-10-25 ENCOUNTER — Other Ambulatory Visit: Payer: Self-pay

## 2020-10-25 ENCOUNTER — Ambulatory Visit (INDEPENDENT_AMBULATORY_CARE_PROVIDER_SITE_OTHER): Payer: Medicare HMO | Admitting: Pulmonary Disease

## 2020-10-25 ENCOUNTER — Encounter: Payer: Self-pay | Admitting: Pulmonary Disease

## 2020-10-25 DIAGNOSIS — G4733 Obstructive sleep apnea (adult) (pediatric): Secondary | ICD-10-CM | POA: Insufficient documentation

## 2020-10-25 NOTE — Progress Notes (Signed)
Virtual Visit via Telephone Note  I was unable to connect with [place] on 10/25/20 at  3:00 PM EST by telephone and verified that I am speaking with the correct person using two identifiers.  Location: Patient: Home Provider: Office [facility] - [address], [place], [place], [place] [address]   I discussed the limitations, risks, security and privacy concerns of performing an evaluation and management service by telephone and the availability of in person appointments. I also discussed with the patient that there may be a patient responsible charge related to this service. The patient expressed understanding and agreed to proceed.  Patient consented to consult via telephone: Yes People present and their role in pt care: Pt     History of Present Illness:  69 year old male current everyday smoker initially referred to our office in November/2021 for concerns of sleep apnea.  Attempted to reach patient 2 times.  Left voicemails.   Chief complaint:     Observations/Objective:  Social History   Tobacco Use  Smoking Status Current Every Day Smoker  . Packs/day: 0.10  . Years: 49.00  . Pack years: 4.90  Smokeless Tobacco Never Used  Tobacco Comment   2 cig a day 09/30/2020   Immunization History  Administered Date(s) Administered  . PFIZER SARS-COV-2 Vaccination 01/29/2020, 02/19/2020    Assessment and Plan:  No problem-specific Assessment & Plan notes found for this encounter.  Follow Up Instructions:  No follow-ups on file.   I discussed the assessment and treatment plan with the patient. The patient was provided an opportunity to ask questions and all were answered. The patient agreed with the plan and demonstrated an understanding of the instructions.   The patient was advised to call back or seek an in-person evaluation if the symptoms worsen or if the condition fails to improve as anticipated.  I provided 0 minutes of non-face-to-face time  during this encounter.   Lauraine Rinne, NP

## 2020-10-26 ENCOUNTER — Ambulatory Visit (INDEPENDENT_AMBULATORY_CARE_PROVIDER_SITE_OTHER): Payer: Medicare HMO | Admitting: Pulmonary Disease

## 2020-10-26 ENCOUNTER — Encounter: Payer: Self-pay | Admitting: Pulmonary Disease

## 2020-10-26 DIAGNOSIS — G4733 Obstructive sleep apnea (adult) (pediatric): Secondary | ICD-10-CM

## 2020-10-26 DIAGNOSIS — Z0000 Encounter for general adult medical examination without abnormal findings: Secondary | ICD-10-CM | POA: Insufficient documentation

## 2020-10-26 DIAGNOSIS — F17200 Nicotine dependence, unspecified, uncomplicated: Secondary | ICD-10-CM | POA: Insufficient documentation

## 2020-10-26 DIAGNOSIS — R69 Illness, unspecified: Secondary | ICD-10-CM | POA: Diagnosis not present

## 2020-10-26 NOTE — Patient Instructions (Addendum)
You were seen today by Lauraine Rinne, NP  for:   1. OSA (obstructive sleep apnea)  - Ambulatory Referral for DME  New CPAP start [facility] ([place] APAP setting 5-15 Mask of choice  We recommend that you start using your CPAP daily >>>Keep up the hard work using your device >>> Goal should be wearing this for the entire night that you are sleeping, at least 4 to 6 hours  Remember:  . Do not drive or operate heavy machinery if tired or drowsy.  . Please notify the supply company and office if you are unable to use your device regularly due to missing supplies or machine being broken.  . Work on maintaining a healthy weight and following your recommended nutrition plan  . Maintain proper daily exercise and movement  . Maintaining proper use of your device can also help improve management of other chronic illnesses such as: Blood pressure, blood sugars, and weight management.   BiPAP/ CPAP Cleaning:  >>>Clean weekly, with Dawn soap, and bottle brush.  Set up to air dry. >>> Wipe mask out daily with wet wipe or towelette   2. Smoker  We recommend that you stop smoking.  >>>You need to set a quit date >>>If you have friends or family who smoke, let them know you are trying to quit and not to smoke around you or in your living environment  Smoking Cessation Resources:  1 800 QUIT NOW  >>> Patient to call this resource and utilize it to help support her quit smoking >>> Keep up your hard work with stopping smoking  You can also contact the [facility] >>>For smoking cessation classes call [phone]  We do not recommend using e-cigarettes as a form of stopping smoking  You can sign up for smoking cessation support texts and information:  >>>https://smokefree.gov/smokefreetxt    3. Healthcare maintenance  We recommend you obtain the seasonal flu vaccine in fall/2021  Recommend that you obtain the Pneumovax 23   We recommend today:  Orders Placed This  Encounter  Procedures  . Ambulatory Referral for DME    Referral Priority:   Routine    Referral Type:   Durable Medical Equipment Purchase    Number of Visits Requested:   1   Orders Placed This Encounter  Procedures  . Ambulatory Referral for DME   No orders of the defined types were placed in this encounter.   Follow Up:    Return in about 3 months (around 01/24/2021), or if symptoms worsen or fail to improve, for [facility].   Notification of test results are managed in the following manner: If there are  any recommendations or changes to the  plan of care discussed in office today,  we will contact you and let you know what they are. If you do not hear from [place], then your results are normal and you can view them through your  MyChart account , or a letter will be sent to you. Thank you again for trusting [place] with your care  - Thank you, [facility]    It is flu season:   >>> Best ways to protect herself from the flu: Receive the yearly flu vaccine, practice good hand hygiene washing with soap and also using hand sanitizer when available, eat a nutritious meals, get adequate rest, hydrate appropriately       Please contact the office if your symptoms worsen or you have concerns that you are not  improving.   Thank you for choosing [facility] for your healthcare, and for allowing [place] to partner with you on your healthcare journey. I am thankful to be able to provide care to you today.   Wyn Quaker FNP-C    Sleep Apnea Sleep apnea affects breathing during sleep. It causes breathing to stop for a short time or to become shallow. It can also increase the risk of:  Heart attack.  Stroke.  Being very overweight (obese).  Diabetes.  Heart failure.  Irregular heartbeat. The goal of treatment is to help you breathe normally again. What are the causes? There are three kinds of sleep apnea:  Obstructive sleep apnea. This is caused by  a blocked or collapsed airway.  Central sleep apnea. This happens when the brain does not send the right signals to the muscles that control breathing.  Mixed sleep apnea. This is a combination of obstructive and central sleep apnea. The most common cause of this condition is a collapsed or blocked airway. This can happen if:  Your throat muscles are too relaxed.  Your tongue and tonsils are too large.  You are overweight.  Your airway is too small. What increases the risk?  Being overweight.  Smoking.  Having a small airway.  Being older.  Being male.  Drinking alcohol.  Taking medicines to calm yourself (sedatives or tranquilizers).  Having family members with the condition. What are the signs or symptoms?  Trouble staying asleep.  Being sleepy or tired during the day.  Getting angry a lot.  Loud snoring.  Headaches in the morning.  Not being able to focus your mind (concentrate).  Forgetting things.  Less interest in sex.  Mood swings.  Personality changes.  Feelings of sadness (depression).  Waking up a lot during the night to pee (urinate).  Dry mouth.  Sore throat. How is this diagnosed?  Your medical history.  A physical exam.  A test that is done when you are sleeping (sleep study). The test is most often done in a sleep lab but may also be done at home. How is this treated?   Sleeping on your side.  Using a medicine to get rid of mucus in your nose (decongestant).  Avoiding the use of alcohol, medicines to help you relax, or certain pain medicines (narcotics).  Losing weight, if needed.  Changing your diet.  Not smoking.  Using a machine to open your airway while you sleep, such as: ? An oral appliance. This is a mouthpiece that shifts your lower jaw forward. ? A CPAP device. This device blows air through a mask when you breathe out (exhale). ? An EPAP device. This has valves that you put in each nostril. ? A BPAP device.  This device blows air through a mask when you breathe in (inhale) and breathe out.  Having surgery if other treatments do not work. It is important to get treatment for sleep apnea. Without treatment, it can lead to:  High blood pressure.  Coronary artery disease.  In men, not being able to have an erection (impotence).  Reduced thinking ability. Follow these instructions at home: Lifestyle  Make changes that your doctor recommends.  Eat a healthy diet.  Lose weight if needed.  Avoid alcohol, medicines to help you relax, and some pain medicines.  Do not use any products that contain nicotine or tobacco, such as cigarettes, e-cigarettes, and chewing tobacco. If you need help quitting, ask your doctor. General instructions  Take over-the-counter  and prescription medicines only as told by your doctor.  If you were given a machine to use while you sleep, use it only as told by your doctor.  If you are having surgery, make sure to tell your doctor you have sleep apnea. You may need to bring your device with you.  Keep all follow-up visits as told by your doctor. This is important. Contact a doctor if:  The machine that you were given to use during sleep bothers you or does not seem to be working.  You do not get better.  You get worse. Get help right away if:  Your chest hurts.  You have trouble breathing in enough air.  You have an uncomfortable feeling in your back, arms, or stomach.  You have trouble talking.  One side of your body feels weak.  A part of your face is hanging down. These symptoms may be an emergency. Do not wait to see if the symptoms will go away. Get medical help right away. Call your local emergency services (911 in the [place] Do not drive yourself to the hospital. Summary  This condition affects breathing during sleep.  The most common cause is a collapsed or blocked airway.  The goal of treatment is to help you breathe normally while you  sleep. This information is not intended to replace advice given to you by your health care provider. Make sure you discuss any questions you have with your health care provider. Document Revised: 08/30/2018 Document Reviewed: 07/09/2018 Elsevier Patient Education  [place] Risks of Smoking Smoking cigarettes is very bad for your health. Tobacco smoke has over 200 known poisons in it. It contains the poisonous gases nitrogen oxide and carbon monoxide. There are over 60 chemicals in tobacco smoke that cause cancer. Smoking is difficult to quit because a chemical in tobacco, called nicotine, causes addiction or dependence. When you smoke and inhale, nicotine is absorbed rapidly into the bloodstream through your lungs. Both inhaled and non-inhaled nicotine may be addictive. What are the risks of cigarette smoke? Cigarette smokers have an increased risk of many serious medical problems, including:  Lung cancer.  Lung disease, such as pneumonia, bronchitis, and emphysema.  Chest pain (angina) and heart attack because the heart is not getting enough oxygen.  Heart disease and peripheral blood vessel disease.  High blood pressure (hypertension).  Stroke.  Oral cancer, including cancer of the lip, mouth, or voice box.  Bladder cancer.  Pancreatic cancer.  Cervical cancer.  Pregnancy complications, including premature birth.  Stillbirths and smaller newborn babies, birth defects, and genetic damage to sperm.  Early menopause.  Lower estrogen level for women.  Infertility.  Facial wrinkles.  Blindness.  Increased risk of broken bones (fractures).  Senile dementia.  Stomach ulcers and internal bleeding.  Delayed wound healing and increased risk of complications during surgery.  Even smoking lightly shortens your life expectancy by several years. Because of secondhand smoke exposure, children of smokers have an increased risk of the following:  Sudden  infant death syndrome (SIDS).  Respiratory infections.  Lung cancer.  Heart disease.  Ear infections. What are the benefits of quitting? There are many health benefits of quitting smoking. Here are some of them:  Within days of quitting smoking, your risk of having a heart attack decreases, your blood flow improves, and your lung capacity improves. Blood pressure, pulse rate, and breathing patterns start returning to normal soon after quitting.  Within months, your lungs  may clear up completely.  Quitting for 10 years reduces your risk of developing lung cancer and heart disease to almost that of a nonsmoker.  People who quit may see an improvement in their overall quality of life. How do I quit smoking?     Smoking is an addiction with both physical and psychological effects, and longtime habits can be hard to change. Your health care provider can recommend:  Programs and community resources, which may include group support, education, or talk therapy.  Prescription medicines to help reduce cravings.  Nicotine replacement products, such as patches, gum, and nasal sprays. Use these products only as directed. Do not replace cigarette smoking with electronic cigarettes, which are commonly called e-cigarettes. The safety of e-cigarettes is not known, and some may contain harmful chemicals.  A combination of two or more of these methods. Where to find more information  American Lung Association: www.lung.org  American Cancer Society: www.cancer.org Summary  Smoking cigarettes is very bad for your health. Cigarette smokers have an increased risk of many serious medical problems, including several cancers, heart disease, and stroke.  Smoking is an addiction with both physical and psychological effects, and longtime habits can be hard to change.  By stopping right away, you can greatly reduce the risk of medical problems for you and your family.  To help you quit smoking, your  health care provider can recommend programs, community resources, prescription medicines, and nicotine replacement products such as patches, gum, and nasal sprays. This information is not intended to replace advice given to you by your health care provider. Make sure you discuss any questions you have with your health care provider. Document Revised: 02/14/2018 Document Reviewed: 11/17/2016 Elsevier Patient Education  2020 [employer].

## 2020-10-26 NOTE — Progress Notes (Signed)
Reviewed and agree with assessment/plan.   Chesley Mires, MD [facility] Care 10/26/2020, 10:16 AM Pager:  [phone]

## 2020-10-26 NOTE — Assessment & Plan Note (Signed)
Discussed pathophysiology of obstructive sleep apnea Discussed treatment options Due to moderate effective sleep apnea was decided the patient will be best treated with CPAP therapy Do not believe that oral appliance would be an adequate option, could consider inspire device in the future if patient fails CPAP therapy  Plan: Start CPAP therapy DME: [facility] [place] Mask of choice APAP setting 5-15 Follow-up with our office in 3 months to show at least 30-day compliance of CPAP start

## 2020-10-26 NOTE — Progress Notes (Signed)
Virtual Visit via Telephone Note  I connected with David Church  on 10/26/20 at  9:30 AM EST by telephone and verified that I am speaking with the correct person using two identifiers.  Location: Patient: Home Provider: Office [facility] - [address], [place], [place], [place] [address]   I discussed the limitations, risks, security and privacy concerns of performing an evaluation and management service by telephone and the availability of in person appointments. I also discussed with the patient that there may be a patient responsible charge related to this service. The patient expressed understanding and agreed to proceed.  Patient consented to consult via telephone: Yes People present and their role in pt care: Pt   History of Present Illness:  69 year old male current smoker initially referred to our office as a sleep consult November/2021.  Found to have moderate obstructive sleep apnea  Past medical history: Hyperlipidemia, hypertension, parasomnia Smoking history: Current smoker.  Currently smoking 2 cigarettes a day.  Pack-year history of 4.9 Maintenance: None Patient of Dr. Halford Chessman [facility])  Chief complaint:    69 year old male current everyday smoker initially referred to our office in [place] for concerns of sleep apnea.  Patient completed a home sleep study which showed moderate effective sleep apnea with an AHI of 27.8.  He is completing a televisit today to review those results as well as discuss treatment options.  His spouse is also on the line.  Patient does continue to smoke.  Currently smoking 2 cigarettes a day.  He has received his COVID-19 vaccinations.  Remains unvaccinated to the seasonal flu as well as to Pneumovax 23   Observations/Objective:  HST 10/13/20 >> AHI 27.8, SpO2 low 90%.  Social History   Tobacco Use  Smoking Status Current Every Day Smoker  . Packs/day: 0.10  . Years: 49.00  . Pack years: 4.90  Smokeless Tobacco  Never Used  Tobacco Comment   2 cig a day 09/30/2020   Immunization History  Administered Date(s) Administered  . PFIZER SARS-COV-2 Vaccination 01/29/2020, 02/19/2020   Got booster - oct/2021  No flu vaccine  No pneumonia vaccine     Assessment and Plan:  OSA (obstructive sleep apnea) Discussed pathophysiology of obstructive sleep apnea Discussed treatment options Due to moderate effective sleep apnea was decided the patient will be best treated with CPAP therapy Do not believe that oral appliance would be an adequate option, could consider inspire device in the future if patient fails CPAP therapy  Plan: Start CPAP therapy DME: [facility] [place] Mask of choice APAP setting 5-15 Follow-up with our office in 3 months to show at least 30-day compliance of CPAP start  Healthcare maintenance Patient is an active smoker and over the age of 89 Explained to patient over the phone that we would recommend seasonal flu vaccine-high-dose as well as Pneumovax 23 This can be reviewed with primary care or at our next in person office visit  Smoker Plan: Recommend the patient stop smoking   Follow Up Instructions:  Return in about 3 months (around 01/24/2021), or if symptoms worsen or fail to improve, for [facility].   I discussed the assessment and treatment plan with the patient. The patient was provided an opportunity to ask questions and all were answered. The patient agreed with the plan and demonstrated an understanding of the instructions.   The patient was advised to call back or seek an in-person evaluation if the symptoms worsen or if the condition fails to improve  as anticipated.  I provided 22 minutes of non-face-to-face time during this encounter.   Lauraine Rinne, NP

## 2020-10-26 NOTE — Assessment & Plan Note (Signed)
Plan: Recommend the patient stop smoking

## 2020-10-26 NOTE — Assessment & Plan Note (Signed)
Patient is an active smoker and over the age of 28 Explained to patient over the phone that we would recommend seasonal flu vaccine-high-dose as well as Pneumovax 23 This can be reviewed with primary care or at our next in person office visit

## 2020-11-02 ENCOUNTER — Telehealth: Payer: Self-pay | Admitting: Internal Medicine

## 2020-11-02 NOTE — Telephone Encounter (Signed)
Call and sch pt appt for recurrent fever x 4 years on and off  Not covid

## 2020-11-06 ENCOUNTER — Encounter: Payer: Self-pay | Admitting: Internal Medicine

## 2020-11-08 ENCOUNTER — Other Ambulatory Visit: Payer: Self-pay | Admitting: Internal Medicine

## 2020-11-08 DIAGNOSIS — I10 Essential (primary) hypertension: Secondary | ICD-10-CM

## 2020-11-08 MED ORDER — AMLODIPINE BESYLATE 2.5 MG PO TABS: 2.5000 mg | ORAL_TABLET | Freq: Every day | ORAL | 3 refills | Status: DC

## 2020-11-12 DIAGNOSIS — H6980 Other specified disorders of Eustachian tube, unspecified ear: Secondary | ICD-10-CM | POA: Diagnosis not present

## 2020-11-12 DIAGNOSIS — K219 Gastro-esophageal reflux disease without esophagitis: Secondary | ICD-10-CM | POA: Diagnosis not present

## 2020-11-12 DIAGNOSIS — J301 Allergic rhinitis due to pollen: Secondary | ICD-10-CM | POA: Diagnosis not present

## 2020-11-18 ENCOUNTER — Ambulatory Visit (INDEPENDENT_AMBULATORY_CARE_PROVIDER_SITE_OTHER): Payer: Medicare HMO | Admitting: Internal Medicine

## 2020-11-18 ENCOUNTER — Other Ambulatory Visit: Payer: Self-pay

## 2020-11-18 ENCOUNTER — Encounter: Payer: Self-pay | Admitting: Internal Medicine

## 2020-11-18 VITALS — BP 148/90 | HR 76 | Temp 97.7°F | Ht 69.0 in | Wt 140.4 lb

## 2020-11-18 DIAGNOSIS — D75839 Thrombocytosis, unspecified: Secondary | ICD-10-CM

## 2020-11-18 DIAGNOSIS — R937 Abnormal findings on diagnostic imaging of other parts of musculoskeletal system: Secondary | ICD-10-CM | POA: Diagnosis not present

## 2020-11-18 DIAGNOSIS — Z1329 Encounter for screening for other suspected endocrine disorder: Secondary | ICD-10-CM | POA: Diagnosis not present

## 2020-11-18 DIAGNOSIS — E785 Hyperlipidemia, unspecified: Secondary | ICD-10-CM

## 2020-11-18 DIAGNOSIS — I10 Essential (primary) hypertension: Secondary | ICD-10-CM | POA: Diagnosis not present

## 2020-11-18 DIAGNOSIS — J302 Other seasonal allergic rhinitis: Secondary | ICD-10-CM | POA: Insufficient documentation

## 2020-11-18 DIAGNOSIS — Z125 Encounter for screening for malignant neoplasm of prostate: Secondary | ICD-10-CM

## 2020-11-18 DIAGNOSIS — Z862 Personal history of diseases of the blood and blood-forming organs and certain disorders involving the immune mechanism: Secondary | ICD-10-CM

## 2020-11-18 DIAGNOSIS — R509 Fever, unspecified: Secondary | ICD-10-CM | POA: Insufficient documentation

## 2020-11-18 DIAGNOSIS — Z0000 Encounter for general adult medical examination without abnormal findings: Secondary | ICD-10-CM

## 2020-11-18 DIAGNOSIS — Z1389 Encounter for screening for other disorder: Secondary | ICD-10-CM | POA: Diagnosis not present

## 2020-11-18 MED ORDER — AMLODIPINE BESYLATE 5 MG PO TABS: 5.0000 mg | ORAL_TABLET | Freq: Every day | ORAL | 3 refills | Status: DC

## 2020-11-18 NOTE — Patient Instructions (Signed)
Increase norvasc/amlodipine to 5 mg total per day   DASH Eating Plan DASH stands for "Dietary Approaches to Stop Hypertension." The DASH eating plan is a healthy eating plan that has been shown to reduce high blood pressure (hypertension). It may also reduce your risk for type 2 diabetes, heart disease, and stroke. The DASH eating plan may also help with weight loss. What are tips for following this plan?  General guidelines  Avoid eating more than 2,300 mg (milligrams) of salt (sodium) a day. If you have hypertension, you may need to reduce your sodium intake to 1,500 mg a day.  Limit alcohol intake to no more than 1 drink a day for nonpregnant women and 2 drinks a day for men. One drink equals 12 oz of beer, 5 oz of wine, or 1 oz of hard liquor.  Work with your health care provider to maintain a healthy body weight or to lose weight. Ask what an ideal weight is for you.  Get at least 30 minutes of exercise that causes your heart to beat faster (aerobic exercise) most days of the week. Activities may include walking, swimming, or biking.  Work with your health care provider or diet and nutrition specialist (dietitian) to adjust your eating plan to your individual calorie needs. Reading food labels   Check food labels for the amount of sodium per serving. Choose foods with less than 5 percent of the Daily Value of sodium. Generally, foods with less than 300 mg of sodium per serving fit into this eating plan.  To find whole grains, look for the word "whole" as the first word in the ingredient list. Shopping  Buy products labeled as "low-sodium" or "no salt added."  Buy fresh foods. Avoid canned foods and premade or frozen meals. Cooking  Avoid adding salt when cooking. Use salt-free seasonings or herbs instead of table salt or sea salt. Check with your health care provider or pharmacist before using salt substitutes.  Do not fry foods. Cook foods using healthy methods such as baking,  boiling, grilling, and broiling instead.  Cook with heart-healthy oils, such as olive, canola, soybean, or sunflower oil. Meal planning  Eat a balanced diet that includes: ? 5 or more servings of fruits and vegetables each day. At each meal, try to fill half of your plate with fruits and vegetables. ? Up to 6-8 servings of whole grains each day. ? Less than 6 oz of lean meat, poultry, or fish each day. A 3-oz serving of meat is about the same size as a deck of cards. One egg equals 1 oz. ? 2 servings of low-fat dairy each day. ? A serving of nuts, seeds, or beans 5 times each week. ? Heart-healthy fats. Healthy fats called Omega-3 fatty acids are found in foods such as flaxseeds and coldwater fish, like sardines, salmon, and mackerel.  Limit how much you eat of the following: ? Canned or prepackaged foods. ? Food that is high in trans fat, such as fried foods. ? Food that is high in saturated fat, such as fatty meat. ? Sweets, desserts, sugary drinks, and other foods with added sugar. ? Full-fat dairy products.  Do not salt foods before eating.  Try to eat at least 2 vegetarian meals each week.  Eat more home-cooked food and less restaurant, buffet, and fast food.  When eating at a restaurant, ask that your food be prepared with less salt or no salt, if possible. What foods are recommended? The items listed may  not be a complete list. Talk with your dietitian about what dietary choices are best for you. Grains Whole-grain or whole-wheat bread. Whole-grain or whole-wheat pasta. Brown rice. [place]row. Bulgur. Whole-grain and low-sodium cereals. Pita bread. Low-fat, low-sodium crackers. Whole-wheat flour tortillas. Vegetables Fresh or frozen vegetables (raw, steamed, roasted, or grilled). Low-sodium or reduced-sodium tomato and vegetable juice. Low-sodium or reduced-sodium tomato sauce and tomato paste. Low-sodium or reduced-sodium canned vegetables. Fruits All fresh, dried, or  frozen fruit. Canned fruit in natural juice (without added sugar). Meat and other protein foods Skinless chicken or [place]. Ground chicken or [place]. Pork with fat trimmed off. Fish and seafood. Egg whites. Dried beans, peas, or lentils. Unsalted nuts, nut butters, and seeds. Unsalted canned beans. Lean cuts of beef with fat trimmed off. Low-sodium, lean deli meat. Dairy Low-fat (1%) or fat-free (skim) milk. Fat-free, low-fat, or reduced-fat cheeses. Nonfat, low-sodium ricotta or cottage cheese. Low-fat or nonfat yogurt. Low-fat, low-sodium cheese. Fats and oils Soft margarine without trans fats. Vegetable oil. Low-fat, reduced-fat, or light mayonnaise and salad dressings (reduced-sodium). Canola, safflower, olive, soybean, and sunflower oils. Avocado. Seasoning and other foods Herbs. Spices. Seasoning mixes without salt. Unsalted popcorn and pretzels. Fat-free sweets. What foods are not recommended? The items listed may not be a complete list. Talk with your dietitian about what dietary choices are best for you. Grains Baked goods made with fat, such as croissants, muffins, or some breads. Dry pasta or rice meal packs. Vegetables Creamed or fried vegetables. Vegetables in a cheese sauce. Regular canned vegetables (not low-sodium or reduced-sodium). Regular canned tomato sauce and paste (not low-sodium or reduced-sodium). Regular tomato and vegetable juice (not low-sodium or reduced-sodium). Angie Fava. Olives. Fruits Canned fruit in a light or heavy syrup. Fried fruit. Fruit in cream or butter sauce. Meat and other protein foods Fatty cuts of meat. Ribs. Fried meat. Berniece Salines. Sausage. Bologna and other processed lunch meats. Salami. Fatback. Hotdogs. Bratwurst. Salted nuts and seeds. Canned beans with added salt. Canned or smoked fish. Whole eggs or egg yolks. Chicken or [place] with skin. Dairy Whole or 2% milk, cream, and half-and-half. Whole or full-fat cream cheese. Whole-fat or sweetened yogurt.  Full-fat cheese. Nondairy creamers. Whipped toppings. Processed cheese and cheese spreads. Fats and oils Butter. Stick margarine. Lard. Shortening. Ghee. Bacon fat. Tropical oils, such as coconut, palm kernel, or palm oil. Seasoning and other foods Salted popcorn and pretzels. Onion salt, garlic salt, seasoned salt, table salt, and sea salt. Worcestershire sauce. Tartar sauce. Barbecue sauce. Teriyaki sauce. Soy sauce, including reduced-sodium. Steak sauce. Canned and packaged gravies. Fish sauce. Oyster sauce. Cocktail sauce. Horseradish that you find on the shelf. Ketchup. Mustard. Meat flavorings and tenderizers. Bouillon cubes. Hot sauce and Tabasco sauce. Premade or packaged marinades. Premade or packaged taco seasonings. Relishes. Regular salad dressings. Where to find more information:  National Heart, Lung, and [place]: https://wilson-eaton.com/  American Heart Association: www.heart.org Summary  The DASH eating plan is a healthy eating plan that has been shown to reduce high blood pressure (hypertension). It may also reduce your risk for type 2 diabetes, heart disease, and stroke.  With the DASH eating plan, you should limit salt (sodium) intake to 2,300 mg a day. If you have hypertension, you may need to reduce your sodium intake to 1,500 mg a day.  When on the DASH eating plan, aim to eat more fresh fruits and vegetables, whole grains, lean proteins, low-fat dairy, and heart-healthy fats.  Work with your health care provider or diet and  nutrition specialist (dietitian) to adjust your eating plan to your individual calorie needs. This information is not intended to replace advice given to you by your health care provider. Make sure you discuss any questions you have with your health care provider. Document Revised: 10/26/2017 Document Reviewed: 11/06/2016 Elsevier Patient Education  2020 [employer].

## 2020-11-18 NOTE — Progress Notes (Addendum)
Chief Complaint  Patient presents with  . Fever  . Hypertension   Annual  1. HTN on norvasc 2.5 mg qd BP still high >130/>80 at times per review of BP log has 7/10 h/a today will increase dose  2. Fever on and off x 1 years or more and night sweats around spring and fall with sinus infection max 104 F and ears ringing seeing Dr. Richardson Landry and f/u sch 12/21/20 for w/u I.e labs for allergies  For FUO declines w/u for now but reviewed we can do imaging and labs he declines for today states had w/in the last 2 years at [facility] will get ROI   Review of Systems  Constitutional: Positive for weight loss. Negative for fever.       Pt lost wt eating less   HENT: Negative for hearing loss.   Eyes: Negative for blurred vision.  Respiratory: Negative for shortness of breath.   Cardiovascular: Negative for chest pain.  Gastrointestinal: Negative for abdominal pain.  Musculoskeletal: Negative for falls.  Skin: Negative for rash.  Neurological: Negative for headaches.  Psychiatric/Behavioral: Negative for depression.   Past Medical History:  Diagnosis Date  . Allergy    Past Surgical History:  Procedure Laterality Date  . AXILLARY LYMPH NODE BIOPSY Right 09/18/2019   Procedure: AXILLARY LYMPH NODE BIOPSY;  Surgeon: Jules Husbands, MD;  Location: [facility] ORS;  Service: General;  Laterality: Right;  . HERNIA REPAIR     15 years ago-right inguinal    Family History  Problem Relation Age of Onset  . Cancer Mother 49  . Diabetes Mother   . Hypertension Mother   . Alzheimer's disease Father   . Heart attack Father   . Stomach cancer Maternal Aunt   . Lung cancer Maternal Uncle    Social History   Socioeconomic History  . Marital status: Married    Spouse name: Not on file  . Number of children: Not on file  . Years of education: Not on file  . Highest education level: Not on file  Occupational History  . Not on file  Tobacco Use  . Smoking status: Current Some Day Smoker     Packs/day: 0.10    Years: 49.00    Pack years: 4.90  . Smokeless tobacco: Never Used  . Tobacco comment: 2 cig a day 09/30/2020  Vaping Use  . Vaping Use: Never used  Substance and Sexual Activity  . Alcohol use: Yes    Alcohol/week: 2.0 standard drinks    Types: 2 Cans of beer per week    Comment: 2 beers a day and wine on special occasion  . Drug use: Not Currently    Types: Marijuana, Cocaine    Comment: did drugs but quit in early 30's  . Sexual activity: Yes  Other Topics Concern  . 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 cetirizine (ZYRTEC) 10 MG tablet Take 10 mg by mouth daily.  . clonazePAM (KLONOPIN) 0.5 MG tablet Take 0.5-1 tablets (0.25-0.5 mg total) by mouth at bedtime as needed for anxiety.  . Multiple Vitamin (MULTIVITAMIN) tablet Take 1 tablet by mouth daily.  . Plant Sterols and Stanols (CHOLESTOFF PO) Take by mouth daily.  . [DISCONTINUED] amLODipine (NORVASC) 2.5 MG tablet Take 1 tablet (2.5 mg total) by mouth daily.   No Known Allergies No results found for this or any previous visit (from the past 2160 hour(s)). Objective  Body mass index is 20.73 kg/m. Wt Readings from Last 3 Encounters:  11/18/20 140 lb 6.4 oz (63.7 kg)  09/30/20 143 lb 6.4 oz (65 kg)  05/26/20 146 lb 3.2 oz (66.3 kg)   Temp Readings from Last 3 Encounters:  11/18/20 97.7 F (36.5 C) (Oral)  09/30/20 (!) 97.1 F (36.2 C) (Temporal)  05/26/20 98.6 F (37 C) (Oral)   BP Readings from  Last 3 Encounters:  11/18/20 (!) 148/90  09/30/20 (!) 144/80  05/26/20 (!) 148/90   Pulse Readings from Last 3 Encounters:  11/18/20 76  09/30/20 77  05/26/20 75    Physical Exam Vitals and nursing note reviewed.  Constitutional:      Appearance: Normal appearance. He is well-developed and well-groomed.  HENT:     Head: Normocephalic and atraumatic.  Eyes:     Conjunctiva/sclera: Conjunctivae normal.     Pupils: Pupils are equal, round, and reactive to light.  Cardiovascular:     Rate and Rhythm: Normal rate and regular rhythm.     Heart sounds: Normal heart sounds.  Pulmonary:     Effort: Pulmonary effort is normal.     Breath sounds: Normal breath sounds.  Abdominal:     General: Abdomen is flat. Bowel sounds are normal.  Skin:    General: Skin is warm and dry.  Neurological:     General: No focal deficit present.     Mental Status: He is alert and oriented to person, place, and time. Mental status is at baseline.     Gait: Gait normal.  Psychiatric:        Attention and Perception: Attention and perception normal.        Mood and Affect: Mood and affect normal.        Speech: Speech normal.        Behavior: Behavior normal. Behavior is cooperative.        Thought Content: Thought content normal.        Cognition and Memory: Cognition and memory normal.        Judgment: Judgment normal.     Assessment  Plan  Annual physical exam Declines all vaccines flu, Tdap both made arm swell, shingrix, prevnar, pna 23 3/3 covid 19 vxhad psa normal 06/2019 repeat after 06/2020 pt declines today as of 11/18/20 will do 01/27/21  Colonoscopy [facility] [place] Dr. Alvester Chou Rosen10/15/18 diverticulosisrepeat in 10 years   rec healthy diet and exercise   Hypertension, unspecified type - Plan: amLODipine (NORVASC) 5 MG tablet increase dose elevated on home readings   FUO (fever of unknown origin)  Per pt related to sinus infection in spring and fall f/u ent Dr. Richardson Landry  12/21/20 allergy testing  Check labs if ent w/u negative check quant gold, esr, crp, ldh, ana rf, spep, upep  CXR and consider CT ab/pelvis  ROI labs imaging [facility] [place] neg 11/17/15 negative  Hep A total 11/17/15 total  Hep B core negative 11/17/15 Hep C 11/17/15  Hep b s Ab <4.2 11/17/15  Lyme igG, igM neg 11/17/15  RF <9 neg 11/17/15  Blood cultures 11/10/15 negative  MRI ab w w/o contrast 2.8 x 3.0 cm left lobe liver 1.0 liver cyst nonspecific perioportal edema, 0.6 x 1.0 cm right renal cyst midpole, L1/L3 vertebral body hemangiomas present 1.3 x 1.7 cm right ant chest wall cyst.  [place] 02/28/16 left liver cyst 3.6 cm solid mass  01/03/17 CXR normal  11/10/15 calcified granulomas liver and spleen prior granulomatous disease. Small hiatal hernia  Provider: Dr. Olivia Mackie McLean-Scocuzza-[facility]

## 2020-11-23 ENCOUNTER — Encounter: Payer: Self-pay | Admitting: Internal Medicine

## 2020-11-23 DIAGNOSIS — R937 Abnormal findings on diagnostic imaging of other parts of musculoskeletal system: Secondary | ICD-10-CM | POA: Insufficient documentation

## 2020-11-23 DIAGNOSIS — Z862 Personal history of diseases of the blood and blood-forming organs and certain disorders involving the immune mechanism: Secondary | ICD-10-CM | POA: Insufficient documentation

## 2020-11-23 NOTE — Telephone Encounter (Signed)
VS please advise on patient email:   Hi It's been weeks since the CPAP was prescribed but it is still on backorder with the supplier and I have not heard from them lately.  Is there am alternative treatment or supplier?  Does any of the less restrictive treatments work?

## 2020-11-24 NOTE — Telephone Encounter (Signed)
Due to Dr. Craige Cotta being out of the office at this time, I am sending this to [facility] for advice. Message dated 11/23/20 talks about pt still not hearing anything about receiving CPAP due to backorder so beginning at this message is where things need to be addressed.  Arlys John, please see mychart messages sent by pt. Thanks!

## 2020-11-24 NOTE — Telephone Encounter (Signed)
11/24/2020  Would recommend that our team contact [facility] to see what the status is. Patient with moderate obstructive sleep apnea.  Still would recommend CPAP therapy.  Oral appliance would not likely adequately treat the apneas the patient is having.  Could see if a substitution CPAP is available via [facility].  Please contact [facility] DME Representative Melissa directly and obtain an update on the patient.  Elisha Headland, FNP

## 2020-11-24 NOTE — Telephone Encounter (Signed)
With Kathyrn Sheriff being our contact with [employer] for CPAP, I have contacted him. Unable to reach so left a message for him to return call.

## 2020-12-07 ENCOUNTER — Encounter: Payer: Self-pay | Admitting: Internal Medicine

## 2020-12-07 DIAGNOSIS — G4733 Obstructive sleep apnea (adult) (pediatric): Secondary | ICD-10-CM | POA: Diagnosis not present

## 2020-12-09 DIAGNOSIS — G4733 Obstructive sleep apnea (adult) (pediatric): Secondary | ICD-10-CM | POA: Diagnosis not present

## 2020-12-21 DIAGNOSIS — J301 Allergic rhinitis due to pollen: Secondary | ICD-10-CM | POA: Diagnosis not present

## 2021-01-07 DIAGNOSIS — G4733 Obstructive sleep apnea (adult) (pediatric): Secondary | ICD-10-CM | POA: Diagnosis not present

## 2021-02-04 DIAGNOSIS — G4733 Obstructive sleep apnea (adult) (pediatric): Secondary | ICD-10-CM | POA: Diagnosis not present

## 2021-02-09 ENCOUNTER — Ambulatory Visit (INDEPENDENT_AMBULATORY_CARE_PROVIDER_SITE_OTHER): Payer: Medicare HMO | Admitting: Pulmonary Disease

## 2021-02-09 ENCOUNTER — Telehealth: Payer: Self-pay | Admitting: *Deleted

## 2021-02-09 ENCOUNTER — Ambulatory Visit: Payer: Medicare HMO | Admitting: Pulmonary Disease

## 2021-02-09 ENCOUNTER — Encounter: Payer: Self-pay | Admitting: Pulmonary Disease

## 2021-02-09 ENCOUNTER — Other Ambulatory Visit: Payer: Self-pay

## 2021-02-09 ENCOUNTER — Telehealth: Payer: Self-pay | Admitting: Pulmonary Disease

## 2021-02-09 VITALS — BP 140/80 | HR 84 | Temp 97.1°F | Ht 69.0 in | Wt 144.4 lb

## 2021-02-09 DIAGNOSIS — R69 Illness, unspecified: Secondary | ICD-10-CM | POA: Diagnosis not present

## 2021-02-09 DIAGNOSIS — Z9989 Dependence on other enabling machines and devices: Secondary | ICD-10-CM

## 2021-02-09 DIAGNOSIS — F17200 Nicotine dependence, unspecified, uncomplicated: Secondary | ICD-10-CM

## 2021-02-09 DIAGNOSIS — G4733 Obstructive sleep apnea (adult) (pediatric): Secondary | ICD-10-CM | POA: Diagnosis not present

## 2021-02-09 DIAGNOSIS — G4752 REM sleep behavior disorder: Secondary | ICD-10-CM | POA: Diagnosis not present

## 2021-02-09 NOTE — Telephone Encounter (Signed)
Report have been printed and given to Dr. Halford Chessman for review.  Nothing further needed at th is time.

## 2021-02-09 NOTE — Progress Notes (Signed)
[facility]  Chief Complaint  Patient presents with  . Follow-up    F/u on Luna CPAP.  No problems    Constitutional:  BP 140/80 (BP Location: Left Arm, Patient Position: Sitting, Cuff Size: Normal)   Pulse 84   Temp (!) 97.1 F (36.2 C) (Temporal)   Ht 5\' 9"  (1.753 m)   Wt 144 lb 6.4 oz (65.5 kg)   SpO2 100%   BMI 21.32 kg/m   Past Medical History:  Allergies, Pre-diabetes  Past Surgical History:  His  has a past surgical history that includes Hernia repair and Axillary lymph node biopsy (Right, 09/18/2019).  Brief Summary:  David Church is a 70 y.o. male smoker with obstructive sleep apnea and dream enactment.      Subjective:   He had sleep study in November.  Found to have moderate sleep apnea.  Started on CPAP.  Has Luna device.  Has nasal mask.  He is sleeping better and feels more alert.  Not getting episodes of dream enactment as much.  Denies sinus congestion, sore throat, dry mouth, or aerophagia.  He is gradually reducing how much he smokes.  He is now down to smoking 1 cigarette every couple of days.  Physical Exam:   Appearance - well kempt   ENMT - no sinus tenderness, no oral exudate, no LAN, Mallampati 4 airway, no stridor  Respiratory - equal breath sounds bilaterally, no wheezing or rales  CV - s1s2 regular rate and rhythm, no murmurs  Ext - no clubbing, no edema  Skin - no rashes  Psych - normal mood and affect    Sleep Tests:   HST 10/13/20 >> AHI 27.8, SpO2 low 90%  Social History:  He  reports that he has been smoking. He has a 4.90 pack-year smoking history. He has never used smokeless tobacco. He reports current alcohol use of about 2.0 standard drinks of alcohol per week. He reports previous drug use. Drugs: Marijuana and Cocaine.  Family History:  His family history includes Alzheimer's disease in his father; Cancer (age of onset: 1) in his mother; Diabetes in his mother; Heart  attack in his father; Hypertension in his mother; Lung cancer in his maternal uncle; Stomach cancer in his maternal aunt.     Plan:   Obstructive sleep apnea. - he is compliant with CPAP and reports benefit from therapy - uses [facility] for his DME - continue auto CPAP 5 to 15 cm H2O with Luna device - will call him results of CPAP download  Dream enactment. - improved - likely related to sleep apnea - monitor clinically  Tobacco abuse. - encouraged him to keep up with smoking cessation efforts  Time Spent Involved in Patient Care on Day of Examination:  25 minutes  Follow up:  Patient Instructions  Will call with results of CPAP download  Follow up in 1 year   Medication List:   Allergies as of 02/09/2021   No Known Allergies     Medication List       Accurate as of February 09, 2021 11:55 AM. If you have any questions, ask your nurse or doctor.        STOP taking these medications   clonazePAM 0.5 MG tablet Commonly known as: KlonoPIN Stopped by: Chesley Mires, MD     TAKE these medications   amLODipine 5 MG tablet Commonly known as: Norvasc Take 1 tablet (5 mg total) by mouth daily.   cetirizine  10 MG tablet Commonly known as: ZYRTEC Take 10 mg by mouth daily.   CHOLESTOFF PO Take by mouth daily.   multivitamin tablet Take 1 tablet by mouth daily.       Signature:  Chesley Mires, MD [place] Pager - [phone] 02/09/2021, 11:55 AM

## 2021-02-09 NOTE — Telephone Encounter (Signed)
Called and spoke with Melissa with [employer], provided information for patient's Luna CPAP machine, she will have him added and see if his machine has remote access and call me back later.

## 2021-02-09 NOTE — Telephone Encounter (Signed)
David Noe do you know if this patient is aware of CPAP report message from Dr Halford Chessman?

## 2021-02-09 NOTE — Telephone Encounter (Signed)
Auto CPAP 12/08/20 to 12/23/20 >> used on 16 of 16 nights with average 9 hours 43 min.  Average AHI 5 with mean CPAP 8 and 95 th percentile CPAP 9 cm H2O.    Please let him know his CPAP report shows good control of sleep apnea with current settings.

## 2021-02-09 NOTE — Telephone Encounter (Signed)
Patient's spouse, Ida(DPR) is aware of results and voiced her understanding.  Patient is scheduled for DL on 02/11/2021 with [facility].

## 2021-02-09 NOTE — Telephone Encounter (Signed)
Spoke to David Church with [facility], who stated that she will contact patient and have him go to local office and get download.  [facility] will fax report to [place] office.

## 2021-02-09 NOTE — Patient Instructions (Signed)
Will call with results of CPAP download  Follow up in 1 year 

## 2021-02-09 NOTE — Telephone Encounter (Signed)
Machine stopped recording on 12/23/2020. Patient stated that wears cpap nightly.   Lm for Melissa with [employer].

## 2021-02-09 NOTE — Telephone Encounter (Signed)
Patient is linked in and should be able to see patient's data. David Church phone number is [phone].

## 2021-03-07 DIAGNOSIS — G4733 Obstructive sleep apnea (adult) (pediatric): Secondary | ICD-10-CM | POA: Diagnosis not present

## 2021-03-09 ENCOUNTER — Ambulatory Visit: Payer: Medicare HMO

## 2021-03-09 ENCOUNTER — Ambulatory Visit (INDEPENDENT_AMBULATORY_CARE_PROVIDER_SITE_OTHER): Payer: Medicare HMO

## 2021-03-09 VITALS — BP 149/87 | Ht 69.0 in | Wt 144.0 lb

## 2021-03-09 DIAGNOSIS — Z0000 Encounter for general adult medical examination without abnormal findings: Secondary | ICD-10-CM | POA: Diagnosis not present

## 2021-03-09 NOTE — Patient Instructions (Addendum)
Mr. Colston , Thank you for taking time to come for your Medicare Wellness Visit. I appreciate your ongoing commitment to your health goals. Please review the following plan we discussed and let me know if I can assist you in the future.   These are the goals we discussed: Goals    . Increase physical activity     Walk more for exercise       This is a list of the screening recommended for you and due dates:  Health Maintenance  Topic Date Due  . Flu Shot  06/27/2021  . Colon Cancer Screening  12/28/2028  . COVID-19 Vaccine  Completed  .  Hepatitis C: One time screening is recommended by Center for Disease Control  (CDC) for  adults born from [birth year] through 1963.   Completed  . HPV Vaccine  Aged Out  . Tetanus Vaccine  Discontinued  . Pneumonia vaccines  Discontinued    Advanced directives: End of life planning; Advance aging; Advanced directives discussed.  Copy of current HCPOA/Living Will requested.    Conditions/risks identified: none new  Next appointment: Follow up in one year for your annual wellness visit.   Preventive Care 11 Years and Older, Male Preventive care refers to lifestyle choices and visits with your health care provider that can promote health and wellness. What does preventive care include?  A yearly physical exam. This is also called an annual well check.  Dental exams once or twice a year.  Routine eye exams. Ask your health care provider how often you should have your eyes checked.  Personal lifestyle choices, including:  Daily care of your teeth and gums.  Regular physical activity.  Eating a healthy diet.  Avoiding tobacco and drug use.  Limiting alcohol use.  Practicing safe sex.  Taking low doses of aspirin every day.  Taking vitamin and mineral supplements as recommended by your health care provider. What happens during an annual well check? The services and screenings done by your health care provider during your annual well check  will depend on your age, overall health, lifestyle risk factors, and family history of disease. Counseling  Your health care provider may ask you questions about your:  Alcohol use.  Tobacco use.  Drug use.  Emotional well-being.  Home and relationship well-being.  Sexual activity.  Eating habits.  History of falls.  Memory and ability to understand (cognition).  Work and work Statistician. Screening  You may have the following tests or measurements:  Height, weight, and BMI.  Blood pressure.  Lipid and cholesterol levels. These may be checked every 5 years, or more frequently if you are over 80 years old.  Skin check.  Lung cancer screening. You may have this screening every year starting at age 6 if you have a 30-pack-year history of smoking and currently smoke or have quit within the past 15 years.  Fecal occult blood test (FOBT) of the stool. You may have this test every year starting at age 66.  Flexible sigmoidoscopy or colonoscopy. You may have a sigmoidoscopy every 5 years or a colonoscopy every 10 years starting at age 70.  Prostate cancer screening. Recommendations will vary depending on your family history and other risks.  Hepatitis C blood test.  Hepatitis B blood test.  Sexually transmitted disease (STD) testing.  Diabetes screening. This is done by checking your blood sugar (glucose) after you have not eaten for a while (fasting). You may have this done every 1-3 years.  Abdominal aortic  aneurysm (AAA) screening. You may need this if you are a current or former smoker.  Osteoporosis. You may be screened starting at age 60 if you are at high risk. Talk with your health care provider about your test results, treatment options, and if necessary, the need for more tests. Vaccines  Your health care provider may recommend certain vaccines, such as:  Influenza vaccine. This is recommended every year.  Tetanus, diphtheria, and acellular pertussis  (Tdap, Td) vaccine. You may need a Td booster every 10 years.  Zoster vaccine. You may need this after age 87.  Pneumococcal 13-valent conjugate (PCV13) vaccine. One dose is recommended after age 74.  Pneumococcal polysaccharide (PPSV23) vaccine. One dose is recommended after age 59. Talk to your health care provider about which screenings and vaccines you need and how often you need them. This information is not intended to replace advice given to you by your health care provider. Make sure you discuss any questions you have with your health care provider. Document Released: 12/10/2015 Document Revised: 08/02/2016 Document Reviewed: 09/14/2015 Elsevier Interactive Patient Education  2017 [place] Prevention in the Home Falls can cause injuries. They can happen to people of all ages. There are many things you can do to make your home safe and to help prevent falls. What can I do on the outside of my home?  Regularly fix the edges of walkways and driveways and fix any cracks.  Remove anything that might make you trip as you walk through a door, such as a raised step or threshold.  Trim any bushes or trees on the path to your home.  Use bright outdoor lighting.  Clear any walking paths of anything that might make someone trip, such as rocks or tools.  Regularly check to see if handrails are loose or broken. Make sure that both sides of any steps have handrails.  Any raised decks and porches should have guardrails on the edges.  Have any leaves, snow, or ice cleared regularly.  Use sand or salt on walking paths during winter.  Clean up any spills in your garage right away. This includes oil or grease spills. What can I do in the bathroom?  Use night lights.  Install grab bars by the toilet and in the tub and shower. Do not use towel bars as grab bars.  Use non-skid mats or decals in the tub or shower.  If you need to sit down in the shower, use a plastic, non-slip  stool.  Keep the floor dry. Clean up any water that spills on the floor as soon as it happens.  Remove soap buildup in the tub or shower regularly.  Attach bath mats securely with double-sided non-slip rug tape.  Do not have throw rugs and other things on the floor that can make you trip. What can I do in the bedroom?  Use night lights.  Make sure that you have a light by your bed that is easy to reach.  Do not use any sheets or blankets that are too big for your bed. They should not hang down onto the floor.  Have a firm chair that has side arms. You can use this for support while you get dressed.  Do not have throw rugs and other things on the floor that can make you trip. What can I do in the kitchen?  Clean up any spills right away.  Avoid walking on wet floors.  Keep items that you use a lot  in easy-to-reach places.  If you need to reach something above you, use a strong step stool that has a grab bar.  Keep electrical cords out of the way.  Do not use floor polish or wax that makes floors slippery. If you must use wax, use non-skid floor wax.  Do not have throw rugs and other things on the floor that can make you trip. What can I do with my stairs?  Do not leave any items on the stairs.  Make sure that there are handrails on both sides of the stairs and use them. Fix handrails that are broken or loose. Make sure that handrails are as long as the stairways.  Check any carpeting to make sure that it is firmly attached to the stairs. Fix any carpet that is loose or worn.  Avoid having throw rugs at the top or bottom of the stairs. If you do have throw rugs, attach them to the floor with carpet tape.  Make sure that you have a light switch at the top of the stairs and the bottom of the stairs. If you do not have them, ask someone to add them for you. What else can I do to help prevent falls?  Wear shoes that:  Do not have high heels.  Have rubber bottoms.  Are  comfortable and fit you well.  Are closed at the toe. Do not wear sandals.  If you use a stepladder:  Make sure that it is fully opened. Do not climb a closed stepladder.  Make sure that both sides of the stepladder are locked into place.  Ask someone to hold it for you, if possible.  Clearly mark and make sure that you can see:  Any grab bars or handrails.  First and last steps.  Where the edge of each step is.  Use tools that help you move around (mobility aids) if they are needed. These include:  Canes.  Walkers.  Scooters.  Crutches.  Turn on the lights when you go into a dark area. Replace any light bulbs as soon as they burn out.  Set up your furniture so you have a clear path. Avoid moving your furniture around.  If any of your floors are uneven, fix them.  If there are any pets around you, be aware of where they are.  Review your medicines with your doctor. Some medicines can make you feel dizzy. This can increase your chance of falling. Ask your doctor what other things that you can do to help prevent falls. This information is not intended to replace advice given to you by your health care provider. Make sure you discuss any questions you have with your health care provider. Document Released: 09/09/2009 Document Revised: 04/20/2016 Document Reviewed: 12/18/2014 Elsevier Interactive Patient Education  2017 [employer].

## 2021-03-09 NOTE — Progress Notes (Addendum)
Subjective:   David Church is a 70 y.o. male who presents for Medicare Annual/Subsequent preventive examination.  Review of Systems    No ROS.  Medicare Wellness Virtual Visit.    Cardiac Risk Factors include: advanced age (>40men, >74 women);male gender;hypertension     Objective:    Today's Vitals   03/09/21 1505  BP: (!) 149/87  Weight: 144 lb (65.3 kg)  Height: 5\' 9"  (1.753 m)   Body mass index is 21.27 kg/m.  Advanced Directives 03/09/2021 04/08/2020 03/08/2020 10/06/2019 09/15/2019 09/04/2019 08/22/2019  Does Patient Have a Medical Advance Directive? Yes Yes Yes Yes No Yes No  Type of Paramedic of [place];Living will [place];Living will [place];Living will Living will;Healthcare Power of [place];Living will Living will;Healthcare Power of Attorney -  Does patient want to make changes to medical advance directive? No - Patient declined No - Patient declined No - Patient declined No - Patient declined No - Patient declined No - Patient declined -  Copy of [name] in Chart? No - copy requested - No - copy requested No - copy requested No - copy requested No - copy requested -  Would patient like information on creating a medical advance directive? - - - - No - Patient declined No - Patient declined No - Patient declined    Current Medications (verified) Outpatient Encounter Medications as of 03/09/2021  Medication Sig   amLODipine (NORVASC) 5 MG tablet Take 1 tablet (5 mg total) by mouth daily.   cetirizine (ZYRTEC) 10 MG tablet Take 10 mg by mouth daily.   Multiple Vitamin (MULTIVITAMIN) tablet Take 1 tablet by mouth daily.   Plant Sterols and Stanols (CHOLESTOFF PO) Take by mouth daily.   No facility-administered encounter medications on file as of 03/09/2021.    Allergies (verified) Patient has no known allergies.   History: Past Medical History:   Diagnosis Date   Allergy    Prediabetes    6.1 10/23/18   Past Surgical History:  Procedure Laterality Date   AXILLARY LYMPH NODE BIOPSY Right 09/18/2019   Procedure: AXILLARY LYMPH NODE BIOPSY;  Surgeon: Jules Husbands, MD;  Location: [facility] ORS;  Service: General;  Laterality: Right;   HERNIA REPAIR     15 years ago-right inguinal    Family History  Problem Relation Age of Onset   Cancer Mother 8   Diabetes Mother    Hypertension Mother    Alzheimer's disease Father    Heart attack Father    Stomach cancer Maternal Aunt    Lung cancer Maternal Uncle    Social History   Socioeconomic History   Marital status: Married    Spouse name: Not on file   Number of children: Not on file   Years of education: Not on file   Highest education level: Not on file  Occupational History   Not on file  Tobacco Use   Smoking status: Current Some Day Smoker    Packs/day: 0.10    Years: 49.00    Pack years: 4.90   Smokeless tobacco: Never Used   Tobacco comment: 1 cig per day, some days none at all.  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[place]    04/2019 moved from [place]    2 kids son 66, daughter 80 as of 01/13/20 daughter in [place]    2 grandsons    24 to cruise    Social Determinants of Radio broadcast assistant Strain: Low Risk    Difficulty of Paying Living Expenses: Not hard at all  Food Insecurity: No Food Insecurity   Worried About Charity fundraiser in the Last Year: Never true   Arboriculturist in the Last Year: Never true   Transportation Needs: No Transportation Needs   Lack of Transportation (Medical): No   Lack of Transportation (Non-Medical): No  Physical Activity: Not on file  Stress: No Stress Concern Present   Feeling of Stress : Not at all  Social Connections: Unknown   Frequency of Communication with Friends and Family: Not on file   Frequency of Social Gatherings with Friends and Family: Not on file   Attends Religious Services: Not on file   Active Member of Clubs or Organizations: Not on file   Attends Archivist Meetings: Not on file   Marital Status: Married    Tobacco Counseling Ready to quit: Not Answered Counseling given: Not Answered Comment: 1 cig per day, some days none at all.   Clinical Intake:  Pre-visit preparation completed: Yes        Diabetes: No  How often do you need to have someone help you when you read instructions, pamphlets, or other written materials from your doctor or pharmacy?: 1 - Never    Interpreter Needed?: No      Activities of Daily Living In your present state of health, do you have any difficulty performing the following activities: 03/09/2021  Hearing? N  Vision? N  Difficulty concentrating or making decisions? N  Walking or climbing stairs? N  Dressing or bathing? N  Doing errands, shopping? N  Preparing Food and eating ? N  Using the Toilet? N  In the past six months, have you accidently leaked urine? N  Do you have problems with loss of bowel control? N  Managing your Medications? N  Managing your Finances? N  Housekeeping or managing your Housekeeping? N  Some recent data might be hidden    Patient Care Team: McLean-Scocuzza, Nino Glow, MD as PCP - General (Internal Medicine)  Indicate any recent Medical Services you may have received from other than [facility] providers in the past year (date may be approximate).     Assessment:   This is a routine wellness examination for [name].  I connected with David Church today by  telephone and verified that I am speaking with the correct person using two identifiers. Location patient: home Location provider: work Persons participating in the virtual visit: patient, Marine scientist.    I discussed the limitations, risks, security and privacy concerns of performing an evaluation and management service by telephone and the availability of in person appointments. The patient expressed understanding and verbally consented to this telephonic visit.    Interactive audio and video telecommunications were attempted between this provider and patient, however failed, due to patient having technical difficulties  OR patient did not have access to video capability.  We continued and completed visit with audio only.  Some vital signs may be absent or patient reported.   Hearing/Vision screen  Hearing Screening   125Hz  250Hz  500Hz  1000Hz  2000Hz  3000Hz  4000Hz  6000Hz  8000Hz   Right ear:           Left ear:           Comments: Patient is able to hear conversational tones without difficulty.  No issues reported.  Vision Screening Comments: Wears corrective lenses Visual acuity not assessed, virtual visit.  They have seen their ophthalmologist in the last 12 months.     Dietary issues and exercise activities discussed: Current Exercise Habits: The patient does not participate in regular exercise at presentLow carb/healthy Good water intake  Goals      Increase physical activity     Walk more for exercise       Depression Screen PHQ 2/9 Scores 03/09/2021 05/26/2020 03/18/2020 03/08/2020 01/13/2020 07/11/2019  PHQ - 2 Score 0 0 0 0 0 1  PHQ- 9 Score - - - - - 1    Fall Risk Fall Risk  03/09/2021 11/18/2020 05/26/2020 03/18/2020 03/08/2020  Falls in the past year? 0 0 0 0 0  Number falls in past yr: 0 0 0 0 -  Injury with Fall? 0 0 0 0 -  Risk for fall due to : - - No Fall Risks - -  Follow up Falls evaluation completed Falls evaluation completed Falls evaluation completed Falls evaluation  completed Falls evaluation completed    [place]: Handrails in use when climbing stairs?Yes Home free of loose throw rugs in walkways, pet beds, electrical cords, etc? Yes  Adequate lighting in your home to reduce risk of falls? Yes   ASSISTIVE DEVICES UTILIZED TO PREVENT FALLS: Use of a cane, walker or w/c? No    TIMED UP AND GO: Was the test performed? No . Virtual visit.   Cognitive Function:  Patient is alert and oriented x3. Denies dffiuculty making decisions, making decisions, memory loss.     6CIT Screen 03/08/2020  Months in reverse 4 points    Immunizations Immunization History  Administered Date(s) Administered   PFIZER(Purple Top)SARS-COV-2 Vaccination 01/29/2020, 02/19/2020, 08/27/2020   Health Maintenance Health Maintenance  Topic Date Due   INFLUENZA VACCINE  06/27/2021   COLONOSCOPY (Pts 45-77yrs Insurance coverage will need to be confirmed)  12/28/2028   COVID-19 Vaccine  Completed   Hepatitis C Screening  Completed   HPV VACCINES  Aged Out   TETANUS/TDAP  Discontinued   PNA vac Low Risk Adult  Discontinued   PNA- discontinued per patient Tdap- discontinued per patient  Colorectal cancer screening: Type of screening: Colonoscopy. Completed 12/2018. Repeat every 10 years  Vision Screening: Recommended annual ophthalmology exams for early detection of glaucoma and other disorders of the eye. Is the patient up to date with their annual eye exam?  Yes   Dental Screening: Recommended annual dental exams for proper oral hygiene  Community Resource Referral / Chronic Care Management: CRR required this visit?  No   CCM required this visit?  No      Plan:    Keep all routine maintenance appointments.   Patient notes difficulty sleeping; increased night terrors. Cpap in use as directed. Followed by sleep specialist, Dr. Halford Chessman.  I have personally reviewed and noted the following in the patient's chart:   Medical and  social history Use  of alcohol, tobacco or illicit drugs  Current medications and supplements Functional ability and status Nutritional status Physical activity Advanced directives List of other physicians Hospitalizations, surgeries, and ER visits in previous 12 months Vitals Screenings to include cognitive, depression, and falls Referrals and appointments  In addition, I have reviewed and discussed with patient certain preventive protocols, quality metrics, and best practice recommendations. A written personalized care plan for preventive services as well as general preventive health recommendations were provided to patient via mychart.     OBrien-Blaney, Emilie Carp L, LPN   6/62/[id number]     I have reviewed the above information and agree with above.   Deborra Medina, MD

## 2021-03-17 ENCOUNTER — Ambulatory Visit: Payer: Medicare HMO | Admitting: Pulmonary Disease

## 2021-04-06 DIAGNOSIS — G4733 Obstructive sleep apnea (adult) (pediatric): Secondary | ICD-10-CM | POA: Diagnosis not present

## 2021-04-11 ENCOUNTER — Inpatient Hospital Stay: Payer: Medicare HMO | Attending: Oncology

## 2021-04-11 ENCOUNTER — Encounter: Payer: Self-pay | Admitting: Oncology

## 2021-04-11 ENCOUNTER — Inpatient Hospital Stay (HOSPITAL_BASED_OUTPATIENT_CLINIC_OR_DEPARTMENT_OTHER): Payer: Medicare HMO | Admitting: Oncology

## 2021-04-11 ENCOUNTER — Other Ambulatory Visit: Payer: Self-pay | Admitting: *Deleted

## 2021-04-11 VITALS — BP 155/80 | HR 60 | Temp 98.1°F | Resp 16 | Ht 69.0 in | Wt 143.6 lb

## 2021-04-11 DIAGNOSIS — D709 Neutropenia, unspecified: Secondary | ICD-10-CM | POA: Diagnosis not present

## 2021-04-11 DIAGNOSIS — Z87891 Personal history of nicotine dependence: Secondary | ICD-10-CM | POA: Insufficient documentation

## 2021-04-11 DIAGNOSIS — D708 Other neutropenia: Secondary | ICD-10-CM

## 2021-04-11 DIAGNOSIS — Z79899 Other long term (current) drug therapy: Secondary | ICD-10-CM | POA: Diagnosis not present

## 2021-04-11 LAB — CBC WITH DIFFERENTIAL/PLATELET
Abs Immature Granulocytes: 0.01 10*3/uL (ref 0.00–0.07)
Basophils Absolute: 0 10*3/uL (ref 0.0–0.1)
Basophils Relative: 1 %
Eosinophils Absolute: 0 10*3/uL (ref 0.0–0.5)
Eosinophils Relative: 1 %
HCT: 48 % (ref 39.0–52.0)
Hemoglobin: 16.2 g/dL (ref 13.0–17.0)
Immature Granulocytes: 0 %
Lymphocytes Relative: 45 %
Lymphs Abs: 2.4 10*3/uL (ref 0.7–4.0)
MCH: 30.9 pg (ref 26.0–34.0)
MCHC: 33.8 g/dL (ref 30.0–36.0)
MCV: 91.4 fL (ref 80.0–100.0)
Monocytes Absolute: 0.5 10*3/uL (ref 0.1–1.0)
Monocytes Relative: 9 %
Neutro Abs: 2.3 10*3/uL (ref 1.7–7.7)
Neutrophils Relative %: 44 %
Platelets: 241 10*3/uL (ref 150–400)
RBC: 5.25 MIL/uL (ref 4.22–5.81)
RDW: 12.4 % (ref 11.5–15.5)
WBC: 5.2 10*3/uL (ref 4.0–10.5)
nRBC: 0 % (ref 0.0–0.2)

## 2021-04-11 NOTE — Progress Notes (Signed)
Pt has no concerns. Just his allergies from pollen. Eyes are red and swollen from all the pollen.

## 2021-04-11 NOTE — Progress Notes (Signed)
Hematology/Oncology Consult note [facility]  Telephone:([phone] [phone]  Patient Care Team: McLean-Scocuzza, Nino Glow, MD as PCP - General (Internal Medicine)   Name of the patient: David Church  [id number]  1951-06-15   Date of visit: 04/11/21  Diagnosis-benign chronic neutropenia  Chief complaint/ Reason for visit-chronic neutropenia follow-up  Heme/Onc history:  patient is a 70 year old African-American male referred to [place] forneutrop[place].CBC from 07/25/2019 showed white count of 5, H&H of 15.6/46.9 with a platelet count of 205. He had neutropenia with an absolute neutrophil count of 1.5 with relative lymphocytosis. Repeat CBC a month later showed white count of 6 with an ANC of 1.8 again with relative lymphocytosis. Platelet counts this time were higher at 535. Patient is otherwise healthy and other than seasonal allergies he reports no other medical problems and takes no other medications.Denies any over-the-counter herbal medications. His appetite is good and his weight has remained stable. Denies any recurrent infections or hospitalizations. He does smoke about 2 cigarettes/day and has been doing so for the last several years.I do not have any prior CBCs for comparison. He moved here from [place] but does not have any prior CBCs with him.Further work-up of neutropenia revealed no abnormality in peripheral smear, B12 folate and LFTs were normal. HIV and hepatitis C testing was negative.  Patient was found to have palpable right axillary adenopathy and was referred to surgery for core biopsy. Core biopsy revealed a small specimen and no evidence of malignancy in the limited specimen. Flow cytometry was also negative for clonal process   Interval history-patient reports doing well and denies any specific complaints at this time.  Denies any recurrent infections or hospitalizations  ECOG PS- 0 Pain scale- 0   Review of  systems- Review of Systems  Constitutional: Negative for chills, fever, malaise/fatigue and weight loss.  HENT: Negative for congestion, ear discharge and nosebleeds.   Eyes: Negative for blurred vision.  Respiratory: Negative for cough, hemoptysis, sputum production, shortness of breath and wheezing.   Cardiovascular: Negative for chest pain, palpitations, orthopnea and claudication.  Gastrointestinal: Negative for abdominal pain, blood in stool, constipation, diarrhea, heartburn, melena, nausea and vomiting.  Genitourinary: Negative for dysuria, flank pain, frequency, hematuria and urgency.  Musculoskeletal: Negative for back pain, joint pain and myalgias.  Skin: Negative for rash.  Neurological: Negative for dizziness, tingling, focal weakness, seizures, weakness and headaches.  Endo/Heme/Allergies: Does not bruise/bleed easily.  Psychiatric/Behavioral: Negative for depression and suicidal ideas. The patient does not have insomnia.       No Known Allergies   Past Medical History:  Diagnosis Date  . Allergy   . Prediabetes    6.1 10/23/18     Past Surgical History:  Procedure Laterality Date  . AXILLARY LYMPH NODE BIOPSY Right 09/18/2019   Procedure: AXILLARY LYMPH NODE BIOPSY;  Surgeon: Jules Husbands, MD;  Location: [facility] ORS;  Service: General;  Laterality: Right;  . HERNIA REPAIR     15 years ago-right inguinal     Social History   Socioeconomic History  . Marital status: Married    Spouse name: Not on file  . Number of children: Not on file  . Years of education: Not on file  . Highest education level: Not on file  Occupational History  . Not on file  Tobacco Use  . Smoking status: Former Smoker    Packs/day: 0.10    Years: 49.00    Pack years: 4.90    Quit  date: 11/2020    Years since quitting: 0.3  . Smokeless tobacco: Never Used  Vaping Use  . Vaping Use: Never used  Substance and Sexual Activity  . Alcohol use: Yes    Alcohol/week: 2.0 standard  drinks    Types: 2 Cans of beer per week    Comment: 2 beers a day and wine on special occasion  . Drug use: Not Currently    Types: Marijuana, Cocaine    Comment: did drugs but quit in early 30's  . Sexual activity: Yes  Other Topics Concern  . 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 Difficulty of Paying Living Expenses: Not hard at all  Food Insecurity: No Food Insecurity  . Worried About Charity fundraiser in the Last Year: Never true  . Ran Out of Food in the Last Year: Never true  Transportation Needs: No Transportation Needs  . Lack of Transportation (Medical): No  . Lack of Transportation (Non-Medical): No  Physical Activity: Not on file  Stress: No Stress Concern Present  . Feeling of Stress : Not at all  Social Connections: Unknown  . Frequency of Communication with Friends and Family: Not on file  . Frequency of Social Gatherings with Friends and Family: Not on file  . Attends Religious Services: Not on file  . Active Member of Clubs or Organizations: Not on file  . Attends Archivist Meetings: Not on file  . Marital Status: Married  Human resources officer Violence: Not At Risk  . Fear of Current or Ex-Partner: No  . Emotionally Abused: No  . Physically Abused: No  . Sexually Abused: No    Family History  Problem Relation Age of Onset  . Cancer Mother 18  . Diabetes Mother   . Hypertension Mother   . Alzheimer's disease Father   . Heart attack Father   . Stomach cancer Maternal Aunt   . Lung cancer Maternal Uncle      Current Outpatient Medications:  .  amLODipine (NORVASC) 5 MG tablet, Take 1 tablet (5 mg total) by mouth daily., Disp: 90 tablet, Rfl: 3 .  cetirizine (ZYRTEC) 10  MG tablet, Take 10 mg by mouth daily., Disp: , Rfl:  .  Multiple Vitamin (MULTIVITAMIN) tablet, Take 1 tablet by mouth daily., Disp: , Rfl:  .  Plant Sterols and Stanols (CHOLESTOFF PO), Take 1 tablet by mouth daily., Disp: , Rfl:   Physical exam:  Vitals:   04/11/21 1043  BP: (!) 155/80  Pulse: 60  Resp: 16  Temp: 98.1 F (36.7 C)  TempSrc: Oral  Weight: 143 lb 9.6 oz (65.1 kg)  Height: 5' 9"  (1.753 m)   Physical Exam Constitutional:      General: He is not in acute distress. Cardiovascular:     Rate and Rhythm: Normal rate and regular rhythm.     Heart sounds: Normal heart sounds.  Pulmonary:     Effort: Pulmonary effort is normal.     Breath sounds: Normal breath sounds.  Lymphadenopathy:     Comments: No palpable cervical, supraclavicular, axillary or inguinal  adenopathy   Skin:    General: Skin is warm and dry.  Neurological:     Mental Status: He is alert and oriented to person, place, and time.      CMP Latest Ref Rng & Units 01/28/2020  Glucose 70 - 99 mg/dL 91  BUN 6 - 23 mg/dL 13  Creatinine 0.40 - 1.50 mg/dL 1.31  Sodium 135 - 145 mEq/L 141  Potassium 3.5 - 5.1 mEq/L 3.8  Chloride 96 - 112 mEq/L 105  CO2 19 - 32 mEq/L 29  Calcium 8.4 - 10.5 mg/dL 9.2  Total Protein 6.0 - 8.3 g/dL 7.0  Total Bilirubin 0.2 - 1.2 mg/dL 0.5  Alkaline Phos 39 - 117 U/L 58  AST 0 - 37 U/L 20  ALT 0 - 53 U/L 17   CBC Latest Ref Rng & Units 04/11/2021  WBC 4.0 - 10.5 K/uL 5.2  Hemoglobin 13.0 - 17.0 g/dL 16.2  Hematocrit 39.0 - 52.0 % 48.0  Platelets 150 - 400 K/uL 241     Assessment and plan- Patient is a 70 y.o. male with chronic neutropenia likely benign here for routine follow-up of  Patient's white cell count has remained stable between 5-6 over the last 1 year.  His counts have dropped down to 3.4 in September 2020 that was transient when his [place] was 1.2.  Presently his [place] is stable between 1.9-2.5 over the last 18 months.  No anemia or thrombocytopenia.  No  recurrent infections.  Patient does not require a bone marrow biopsy at this time and can continue to follow-up with Dr. Terese Door.  He can be referred to [place] in the future if questions or concerns arise   Visit Diagnosis 1. Chronic benign neutropenia (HCC)      Dr. Randa Evens, MD, MPH [facility] [phone] 04/11/2021 1:00 PM

## 2021-05-07 DIAGNOSIS — G4733 Obstructive sleep apnea (adult) (pediatric): Secondary | ICD-10-CM | POA: Diagnosis not present

## 2021-06-06 DIAGNOSIS — G4733 Obstructive sleep apnea (adult) (pediatric): Secondary | ICD-10-CM | POA: Diagnosis not present

## 2021-06-16 DIAGNOSIS — G4733 Obstructive sleep apnea (adult) (pediatric): Secondary | ICD-10-CM | POA: Diagnosis not present

## 2021-07-01 ENCOUNTER — Ambulatory Visit: Payer: Medicare HMO | Attending: Internal Medicine

## 2021-07-01 ENCOUNTER — Other Ambulatory Visit: Payer: Self-pay

## 2021-07-01 DIAGNOSIS — Z23 Encounter for immunization: Secondary | ICD-10-CM

## 2021-07-01 MED ORDER — PFIZER-BIONT COVID-19 VAC-TRIS 30 MCG/0.3ML IM SUSP
INTRAMUSCULAR | 0 refills | Status: DC
  Filled 2021-07-01: qty 0.3, 1d supply, fill #0

## 2021-07-01 NOTE — Progress Notes (Signed)
   Covid-19 Vaccination Clinic  Name:  Hakop Kirt    MRN: [id number] DOB: 01-07-1951  07/01/2021  Mr. Kneisley was observed post Covid-19 immunization for 15 minutes without incident. He was provided with Vaccine Information Sheet and instruction to access the V-Safe system.   Mr. Yoho was instructed to call 911 with any severe reactions post vaccine: Difficulty breathing  Swelling of face and throat  A fast heartbeat  A bad rash all over body  Dizziness and weakness   Immunizations Administered     Name Date Dose VIS Date Route   PFIZER Comrnaty(Gray TOP) Covid-19 Vaccine 07/01/2021 11:02 AM 0.3 mL 11/04/2020 Intramuscular   Manufacturer: [place]   Lot: [id number]   [place]: [place], PharmD, MBA Clinical Acute Care Pharmacist

## 2021-07-07 DIAGNOSIS — G4733 Obstructive sleep apnea (adult) (pediatric): Secondary | ICD-10-CM | POA: Diagnosis not present

## 2021-08-07 DIAGNOSIS — G4733 Obstructive sleep apnea (adult) (pediatric): Secondary | ICD-10-CM | POA: Diagnosis not present

## 2021-08-08 DIAGNOSIS — H524 Presbyopia: Secondary | ICD-10-CM | POA: Diagnosis not present

## 2021-08-24 ENCOUNTER — Telehealth (INDEPENDENT_AMBULATORY_CARE_PROVIDER_SITE_OTHER): Payer: Medicare HMO | Admitting: Nurse Practitioner

## 2021-08-24 ENCOUNTER — Other Ambulatory Visit: Payer: Self-pay

## 2021-08-24 DIAGNOSIS — U071 COVID-19: Secondary | ICD-10-CM

## 2021-08-24 HISTORY — DX: COVID-19: U07.1

## 2021-08-24 MED ORDER — MOLNUPIRAVIR EUA 200MG CAPSULE: 4.0000 | ORAL_CAPSULE | Freq: Two times a day (BID) | ORAL | 0 refills | Status: AC

## 2021-08-24 NOTE — Progress Notes (Signed)
Patient ID: David Church, male    DOB: 02-Aug-1951, 70 y.o.   MRN: [id number]  Virtual visit completed through [place], a video enabled telemedicine application. Due to national recommendations of social distancing due to COVID-19, a virtual visit is felt to be most appropriate for this patient at this time. Reviewed limitations, risks, security and privacy concerns of performing a virtual visit and the availability of in person appointments. I also reviewed that there may be a patient responsible charge related to this service. The patient agreed to proceed.   Patient location: home Provider location: [place] [facility], office Persons participating in this virtual visit: patient, provider, spouse  If any vitals were documented, they were collected by patient at home unless specified below.    There were no vitals taken for this visit.   CC: Covid 19 Subjective:   HPI: David Church is a 70 y.o. male presenting on 08/24/2021 for Covid Positive (Positive this morning, dry cough, drainage, sore throat, headache, congestion )   Positive This morning. Symptoms start yesterday 08/19/2021. He has been vaccinated against covid. Pfizer x2 and boosted twice.  Allergy medicine helped some.   Relevant past medical, surgical, family and social history reviewed and updated as indicated. Interim medical history since our last visit reviewed. Allergies and medications reviewed and updated. Outpatient Medications Prior to Visit  Medication Sig Dispense Refill   amLODipine (NORVASC) 5 MG tablet Take 1 tablet (5 mg total) by mouth daily. 90 tablet 3   cetirizine (ZYRTEC) 10 MG tablet Take 10 mg by mouth daily.     COVID-19 mRNA Vac-TriS, Pfizer, (PFIZER-BIONT COVID-19 VAC-TRIS) SUSP injection Inject into the muscle. 0.3 mL 0   Multiple Vitamin (MULTIVITAMIN) tablet Take 1 tablet by mouth daily.     Plant Sterols and Stanols (CHOLESTOFF PO) Take 1 tablet by mouth daily.     No  facility-administered medications prior to visit.     Per HPI unless specifically indicated in ROS section below Review of Systems  Constitutional:  Positive for fever. Negative for chills.  HENT:  Positive for congestion. Negative for sore throat.   Respiratory:  Positive for cough (yellow). Negative for shortness of breath.   Gastrointestinal:  Negative for diarrhea, nausea and vomiting.  Musculoskeletal:  Positive for myalgias.  Neurological:  Positive for headaches.  Objective:  There were no vitals taken for this visit.  Wt Readings from Last 3 Encounters:  04/11/21 143 lb 9.6 oz (65.1 kg)  03/09/21 144 lb (65.3 kg)  02/09/21 144 lb 6.4 oz (65.5 kg)       Physical exam: Gen: alert, NAD, not ill appearing Pulm: speaks in complete sentences without increased work of breathing Psych: normal mood, normal thought content      Results for orders placed or performed in visit on 04/11/21  CBC with Differential/Platelet  Result Value Ref Range   WBC 5.2 4.0 - 10.5 K/uL   RBC 5.25 4.22 - 5.81 MIL/uL   Hemoglobin 16.2 13.0 - 17.0 g/dL   HCT 48.0 39.0 - 52.0 %   MCV 91.4 80.0 - 100.0 fL   MCH 30.9 26.0 - 34.0 pg   MCHC 33.8 30.0 - 36.0 g/dL   RDW 12.4 11.5 - 15.5 %   Platelets 241 150 - 400 K/uL   nRBC 0.0 0.0 - 0.2 %   Neutrophils Relative % 44 %   Neutro Abs 2.3 1.7 - 7.7 K/uL   Lymphocytes Relative 45 %   Lymphs Abs 2.4  0.7 - 4.0 K/uL   Monocytes Relative 9 %   Monocytes Absolute 0.5 0.1 - 1.0 K/uL   Eosinophils Relative 1 %   Eosinophils Absolute 0.0 0.0 - 0.5 K/uL   Basophils Relative 1 %   Basophils Absolute 0.0 0.0 - 0.1 K/uL   Immature Granulocytes 0 %   Abs Immature Granulocytes 0.01 0.00 - 0.07 K/uL   Assessment & Plan:   Problem List Items Addressed This Visit       Other   MOLMB-86 - Primary    Patient positive for COVID-19.  Patient is on the cusp of treatment window with a 5 out of 5 discussed this with patient he can start medication today discussed  side effects of using molnupiravir.  Patient acknowledged.  Patient may continue taking prescribed medications along with over-the-counter treatments for symptoms.  Continue to monitor.  Begin molnupiravir as soon as possible.      Relevant Medications   molnupiravir EUA (LAGEVRIO) 200 mg CAPS capsule     No orders of the defined types were placed in this encounter.  No orders of the defined types were placed in this encounter.   I discussed the assessment and treatment plan with the patient. The patient was provided an opportunity to ask questions and all were answered. The patient agreed with the plan and demonstrated an understanding of the instructions. The patient was advised to call back or seek an in-person evaluation if the symptoms worsen or if the condition fails to improve as anticipated.  Follow up plan: No follow-ups on file.  Romilda Garret, NP

## 2021-08-24 NOTE — Assessment & Plan Note (Signed)
Patient positive for COVID-19.  Patient is on the cusp of treatment window with a 5 out of 5 discussed this with patient he can start medication today discussed side effects of using molnupiravir.  Patient acknowledged.  Patient may continue taking prescribed medications along with over-the-counter treatments for symptoms.  Continue to monitor.  Begin molnupiravir as soon as possible.

## 2021-09-06 DIAGNOSIS — G4733 Obstructive sleep apnea (adult) (pediatric): Secondary | ICD-10-CM | POA: Diagnosis not present

## 2021-09-09 DIAGNOSIS — H25812 Combined forms of age-related cataract, left eye: Secondary | ICD-10-CM | POA: Diagnosis not present

## 2021-09-09 DIAGNOSIS — Z01818 Encounter for other preprocedural examination: Secondary | ICD-10-CM | POA: Diagnosis not present

## 2021-09-19 ENCOUNTER — Encounter: Payer: Self-pay | Admitting: Internal Medicine

## 2021-09-19 DIAGNOSIS — H25812 Combined forms of age-related cataract, left eye: Secondary | ICD-10-CM | POA: Diagnosis not present

## 2021-09-19 DIAGNOSIS — H2512 Age-related nuclear cataract, left eye: Secondary | ICD-10-CM | POA: Diagnosis not present

## 2021-09-19 NOTE — Telephone Encounter (Signed)
Do I need to print these out and attach to Patient chart in another way?

## 2021-09-20 NOTE — Telephone Encounter (Signed)
Noted  

## 2021-10-07 DIAGNOSIS — G4733 Obstructive sleep apnea (adult) (pediatric): Secondary | ICD-10-CM | POA: Diagnosis not present

## 2021-11-01 ENCOUNTER — Other Ambulatory Visit: Payer: Self-pay

## 2021-11-01 ENCOUNTER — Encounter: Payer: Self-pay | Admitting: Internal Medicine

## 2021-11-01 ENCOUNTER — Ambulatory Visit (INDEPENDENT_AMBULATORY_CARE_PROVIDER_SITE_OTHER): Payer: Medicare HMO | Admitting: Internal Medicine

## 2021-11-01 VITALS — BP 146/84 | HR 75 | Temp 97.0°F | Ht 69.0 in | Wt 146.9 lb

## 2021-11-01 DIAGNOSIS — D75839 Thrombocytosis, unspecified: Secondary | ICD-10-CM

## 2021-11-01 DIAGNOSIS — Z1389 Encounter for screening for other disorder: Secondary | ICD-10-CM

## 2021-11-01 DIAGNOSIS — E785 Hyperlipidemia, unspecified: Secondary | ICD-10-CM

## 2021-11-01 DIAGNOSIS — R7303 Prediabetes: Secondary | ICD-10-CM | POA: Insufficient documentation

## 2021-11-01 DIAGNOSIS — Z125 Encounter for screening for malignant neoplasm of prostate: Secondary | ICD-10-CM | POA: Diagnosis not present

## 2021-11-01 DIAGNOSIS — K7689 Other specified diseases of liver: Secondary | ICD-10-CM | POA: Insufficient documentation

## 2021-11-01 DIAGNOSIS — Z9989 Dependence on other enabling machines and devices: Secondary | ICD-10-CM

## 2021-11-01 DIAGNOSIS — I10 Essential (primary) hypertension: Secondary | ICD-10-CM

## 2021-11-01 DIAGNOSIS — G4733 Obstructive sleep apnea (adult) (pediatric): Secondary | ICD-10-CM | POA: Diagnosis not present

## 2021-11-01 DIAGNOSIS — Z1329 Encounter for screening for other suspected endocrine disorder: Secondary | ICD-10-CM | POA: Diagnosis not present

## 2021-11-01 DIAGNOSIS — E559 Vitamin D deficiency, unspecified: Secondary | ICD-10-CM

## 2021-11-01 MED ORDER — AMLODIPINE BESYLATE 5 MG PO TABS: 5.0000 mg | ORAL_TABLET | Freq: Every day | ORAL | 3 refills | Status: DC

## 2021-11-01 NOTE — Patient Instructions (Signed)
[phone] Not available [place]   [address] [id number]  [place] [facility]  Call and schedule sleep apnea f/u please

## 2021-11-01 NOTE — Progress Notes (Signed)
Chief Complaint  Patient presents with   Follow-up   F/u  1. Htn improved on norvasc 5 mg qd bp improved on 120-140s at home/<80s-90s  2. Hld will reassess need for statin   Review of Systems  Constitutional:  Negative for weight loss.  HENT:  Negative for hearing loss.   Eyes:  Negative for blurred vision.  Respiratory:  Negative for shortness of breath.   Cardiovascular:  Negative for chest pain.  Gastrointestinal:  Negative for abdominal pain and blood in stool.  Musculoskeletal:  Negative for back pain.  Skin:  Negative for rash.  Neurological:  Negative for headaches.  Psychiatric/Behavioral:  Negative for depression.   Past Medical History:  Diagnosis Date   Allergy    COVID-19 08/24/2021   molnupiravir x bid x 5 days   OSA on CPAP    Prediabetes    6.1 10/23/18   Past Surgical History:  Procedure Laterality Date   AXILLARY LYMPH NODE BIOPSY Right 09/18/2019   Procedure: AXILLARY LYMPH NODE BIOPSY;  Surgeon: Jules Husbands, MD;  Location: [facility] ORS;  Service: General;  Laterality: Right;   HERNIA REPAIR     15 years ago-right inguinal    left cataract surgery     Family History  Problem Relation Age of Onset   Cancer Mother 23   Diabetes Mother    Hypertension Mother    Alzheimer's disease Father    Heart attack Father    Stomach cancer Maternal Aunt    Lung cancer Maternal Uncle    Social History   Socioeconomic History   Marital status: Married    Spouse name: Not on file   Number of children: Not on file   Years of education: Not on file   Highest education level: Not on file  Occupational History   Not on file  Tobacco Use   Smoking status: Former    Packs/day: 0.10    Years: 49.00    Pack years: 4.90    Types: Cigarettes    Quit date: 11/2020    Years since quitting: 0.9   Smokeless tobacco: Never  Vaping Use   Vaping Use: Never used  Substance and Sexual Activity   Alcohol use: Yes    Alcohol/week: 2.0 standard drinks    Types: 2 Cans  of beer per week    Comment: 2 beers a day and wine on special occasion   Drug use: Not Currently    Types: Marijuana, Cocaine    Comment: did drugs but quit in early 30's   Sexual activity: Yes  Other Topics Concern   Not on file  Social History Narrative   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 [place]    2 kids son 103, daughter 75 as of 01/13/20 daughter in [place]    2 grandsons    Likes to cruise    Social Determinants of Radio broadcast assistant Strain: Low Risk    Difficulty of Paying Living Expenses: Not hard at all  Food Insecurity: No Food Insecurity   Worried About Charity fundraiser in the Last Year: Never true   Arboriculturist in the Last Year: Never true  Transportation Needs: No Transportation Needs   Lack of Transportation (Medical): No   Lack of Transportation (Non-Medical): No  Physical Activity: Not on file  Stress: No Stress Concern Present  Feeling of Stress : Not at all  Social Connections: Unknown   Frequency of Communication with Friends and Family: Not on file   Frequency of Social Gatherings with Friends and Family: Not on file   Attends Religious Services: Not on Electrical engineer or Organizations: Not on file   Attends Archivist Meetings: Not on file   Marital Status: Married  Human resources officer Violence: Not At Risk   Fear of Current or Ex-Partner: No   Emotionally Abused: No   Physically Abused: No   Sexually Abused: No   Current Meds  Medication Sig   fluticasone (FLONASE) 50 MCG/ACT nasal spray SMARTSIG:1-2 Spray(s) Both Nares Daily   Multiple Vitamin (MULTIVITAMIN) tablet Take 1 tablet by mouth daily.   [DISCONTINUED] amLODipine (NORVASC) 5 MG tablet Take 1 tablet (5 mg total) by mouth daily.   No Known Allergies No results found for this or any previous visit (from the past 2160 hour(s)). Objective  Body mass index is 21.69 kg/m. Wt Readings from Last 3  Encounters:  11/01/21 146 lb 14.2 oz (66.6 kg)  04/11/21 143 lb 9.6 oz (65.1 kg)  03/09/21 144 lb (65.3 kg)   Temp Readings from Last 3 Encounters:  11/01/21 (!) 97 F (36.1 C) (Temporal)  04/11/21 98.1 F (36.7 C) (Oral)  02/09/21 (!) 97.1 F (36.2 C) (Temporal)   BP Readings from Last 3 Encounters:  11/01/21 (!) 146/84  04/11/21 (!) 155/80  03/09/21 (!) 149/87   Pulse Readings from Last 3 Encounters:  11/01/21 75  04/11/21 60  02/09/21 84    Physical Exam Vitals and nursing note reviewed.  Constitutional:      Appearance: Normal appearance. He is well-developed and well-groomed. He is obese.  HENT:     Head: Normocephalic and atraumatic.  Eyes:     Conjunctiva/sclera: Conjunctivae normal.     Pupils: Pupils are equal, round, and reactive to light.  Cardiovascular:     Rate and Rhythm: Normal rate and regular rhythm.     Heart sounds: Normal heart sounds.  Pulmonary:     Effort: Pulmonary effort is normal. No respiratory distress.     Breath sounds: Normal breath sounds.  Abdominal:     Tenderness: There is no abdominal tenderness.  Musculoskeletal:     Lumbar back: Tenderness present. Negative right straight leg raise test and negative left straight leg raise test.  Skin:    General: Skin is warm and moist.  Neurological:     General: No focal deficit present.     Mental Status: He is alert and oriented to person, place, and time. Mental status is at baseline.     Sensory: Sensation is intact.     Motor: Motor function is intact.     Coordination: Coordination is intact.     Gait: Gait is intact. Gait normal.  Psychiatric:        Attention and Perception: Attention and perception normal.        Mood and Affect: Mood and affect normal.        Speech: Speech normal.        Behavior: Behavior normal. Behavior is cooperative.        Thought Content: Thought content normal.        Cognition and Memory: Cognition and memory normal.        Judgment: Judgment  normal.    Assessment  Plan  Hypertension, improved on norvasc 5 mg qd- Plan: amLODipine (NORVASC) 5  MG tablet, Comprehensive metabolic panel, Lipid panel, CBC with Differential/Platelet Monitor BP   OSA on CPAP Due f/u 01/2022   Hyperlipidemia - Plan: Lipid panel Consider statin   Thrombocytosis - Plan: CBC with Differential/Platelet, Pathologist smear review, Iron, TIBC and Ferritin Panel  HM Declines all vaccines flu, Tdap both made arm swell, shingrix, prevnar, pna 25  4/4 covid 19 vx had had covid 08/24/21  psa normal 06/2019 recheck  Colonoscopy [facility] [place] Dr. Abundio Miu 09/10/17 diverticulosis repeat in 10 years    rec healthy diet and exercise    Hep C 11/17/15   MRI ab w w/o contrast 2.8 x 3.0 cm left lobe liver 1.0 liver cyst nonspecific perioportal edema, 0.6 x 1.0 cm right renal cyst midpole, L1/L3 vertebral body hemangiomas present 1.3 x 1.7 cm right ant chest wall cyst.  [place] 02/28/16 left liver cyst 3.6 cm solid mass  01/03/17 CXR normal  11/10/15 calcified granulomas liver and spleen prior granulomatous disease. Small hiatal hernia   Provider: Dr. Olivia Mackie McLean-Scocuzza-Internal Medicine

## 2021-11-04 DIAGNOSIS — Z0100 Encounter for examination of eyes and vision without abnormal findings: Secondary | ICD-10-CM | POA: Diagnosis not present

## 2021-11-06 DIAGNOSIS — G4733 Obstructive sleep apnea (adult) (pediatric): Secondary | ICD-10-CM | POA: Diagnosis not present

## 2021-11-17 ENCOUNTER — Other Ambulatory Visit (INDEPENDENT_AMBULATORY_CARE_PROVIDER_SITE_OTHER): Payer: Medicare HMO

## 2021-11-17 ENCOUNTER — Other Ambulatory Visit: Payer: Self-pay

## 2021-11-17 ENCOUNTER — Encounter: Payer: Self-pay | Admitting: Internal Medicine

## 2021-11-17 ENCOUNTER — Other Ambulatory Visit: Payer: Self-pay | Admitting: Internal Medicine

## 2021-11-17 DIAGNOSIS — Z125 Encounter for screening for malignant neoplasm of prostate: Secondary | ICD-10-CM | POA: Diagnosis not present

## 2021-11-17 DIAGNOSIS — E785 Hyperlipidemia, unspecified: Secondary | ICD-10-CM

## 2021-11-17 DIAGNOSIS — Z1389 Encounter for screening for other disorder: Secondary | ICD-10-CM | POA: Diagnosis not present

## 2021-11-17 DIAGNOSIS — E559 Vitamin D deficiency, unspecified: Secondary | ICD-10-CM

## 2021-11-17 DIAGNOSIS — R972 Elevated prostate specific antigen [PSA]: Secondary | ICD-10-CM | POA: Insufficient documentation

## 2021-11-17 DIAGNOSIS — D75839 Thrombocytosis, unspecified: Secondary | ICD-10-CM

## 2021-11-17 DIAGNOSIS — I10 Essential (primary) hypertension: Secondary | ICD-10-CM

## 2021-11-17 DIAGNOSIS — Z1329 Encounter for screening for other suspected endocrine disorder: Secondary | ICD-10-CM

## 2021-11-17 DIAGNOSIS — R7303 Prediabetes: Secondary | ICD-10-CM | POA: Diagnosis not present

## 2021-11-17 LAB — PSA, MEDICARE: PSA: 6.61 ng/ml — ABNORMAL HIGH (ref 0.10–4.00)

## 2021-11-17 LAB — COMPREHENSIVE METABOLIC PANEL
ALT: 13 U/L (ref 0–53)
AST: 16 U/L (ref 0–37)
Albumin: 4.1 g/dL (ref 3.5–5.2)
Alkaline Phosphatase: 54 U/L (ref 39–117)
BUN: 11 mg/dL (ref 6–23)
CO2: 30 mEq/L (ref 19–32)
Calcium: 9.1 mg/dL (ref 8.4–10.5)
Chloride: 103 mEq/L (ref 96–112)
Creatinine, Ser: 1.3 mg/dL (ref 0.40–1.50)
GFR: 56.3 mL/min — ABNORMAL LOW (ref >60.00)
Glucose, Bld: 95 mg/dL (ref 70–99)
Potassium: 3.8 mEq/L (ref 3.5–5.1)
Sodium: 140 mEq/L (ref 135–145)
Total Bilirubin: 0.7 mg/dL (ref 0.2–1.2)
Total Protein: 7.3 g/dL (ref 6.0–8.3)

## 2021-11-17 LAB — CBC WITH DIFFERENTIAL/PLATELET
Basophils Absolute: 0 10*3/uL (ref 0.0–0.1)
Basophils Relative: 0.7 % (ref 0.0–3.0)
Eosinophils Absolute: 0 10*3/uL (ref 0.0–0.7)
Eosinophils Relative: 0.5 % (ref 0.0–5.0)
HCT: 48 % (ref 39.0–52.0)
Hemoglobin: 15.7 g/dL (ref 13.0–17.0)
Lymphocytes Relative: 45.3 % (ref 12.0–46.0)
Lymphs Abs: 2.1 10*3/uL (ref 0.7–4.0)
MCHC: 32.8 g/dL (ref 30.0–36.0)
MCV: 90 fl (ref 78.0–100.0)
Monocytes Absolute: 0.5 10*3/uL (ref 0.1–1.0)
Monocytes Relative: 11.3 % (ref 3.0–12.0)
Neutro Abs: 1.9 10*3/uL (ref 1.4–7.7)
Neutrophils Relative %: 42.2 % — ABNORMAL LOW (ref 43.0–77.0)
Platelets: 203 10*3/uL (ref 150.0–400.0)
RBC: 5.33 Mil/uL (ref 4.22–5.81)
RDW: 13.8 % (ref 11.5–15.5)
WBC: 4.5 10*3/uL (ref 4.0–10.5)

## 2021-11-17 LAB — LIPID PANEL
Cholesterol: 231 mg/dL — ABNORMAL HIGH (ref 0–200)
HDL: 76.6 mg/dL (ref >39.00)
LDL Cholesterol: 141 mg/dL — ABNORMAL HIGH (ref 0–99)
NonHDL: 154.52
Total CHOL/HDL Ratio: 3
Triglycerides: 70 mg/dL (ref 0.0–149.0)
VLDL: 14 mg/dL (ref 0.0–40.0)

## 2021-11-17 LAB — VITAMIN D 25 HYDROXY (VIT D DEFICIENCY, FRACTURES): VITD: 13.69 ng/mL — ABNORMAL LOW (ref 30.00–100.00)

## 2021-11-17 LAB — HEMOGLOBIN A1C: Hgb A1c MFr Bld: 5.7 % (ref 4.6–6.5)

## 2021-11-17 LAB — TSH: TSH: 1.31 u[IU]/mL (ref 0.35–5.50)

## 2021-11-17 MED ORDER — CHOLECALCIFEROL 1.25 MG (50000 UT) PO CAPS: 50000.0000 [IU] | ORAL_CAPSULE | ORAL | 1 refills | Status: DC

## 2021-11-18 LAB — URINALYSIS, ROUTINE W REFLEX MICROSCOPIC
Bilirubin Urine: NEGATIVE
Glucose, UA: NEGATIVE
Hgb urine dipstick: NEGATIVE
Ketones, ur: NEGATIVE
Leukocytes,Ua: NEGATIVE
Nitrite: NEGATIVE
Protein, ur: NEGATIVE
Specific Gravity, Urine: 1.021 (ref 1.001–1.035)
pH: 6 (ref 5.0–8.0)

## 2021-11-18 LAB — PATHOLOGIST SMEAR REVIEW

## 2021-11-18 LAB — IRON,TIBC AND FERRITIN PANEL
%SAT: 31 % (calc) (ref 20–48)
Ferritin: 94 ng/mL (ref 24–380)
Iron: 104 ug/dL (ref 50–180)
TIBC: 336 mcg/dL (calc) (ref 250–425)

## 2021-11-22 ENCOUNTER — Ambulatory Visit: Payer: Medicare HMO | Attending: Internal Medicine

## 2021-11-22 ENCOUNTER — Other Ambulatory Visit: Payer: Self-pay

## 2021-11-22 DIAGNOSIS — Z23 Encounter for immunization: Secondary | ICD-10-CM

## 2021-11-22 MED ORDER — PFIZER COVID-19 VAC BIVALENT 30 MCG/0.3ML IM SUSP
INTRAMUSCULAR | 0 refills | Status: DC
  Filled 2021-11-22: qty 0.3, 1d supply, fill #0

## 2021-11-22 NOTE — Progress Notes (Signed)
° °  Covid-19 Vaccination Clinic  Name:  Jidenna Figgs    MRN: [id number] DOB: September 02, 1951  11/22/2021  Mr. Snarski was observed post Covid-19 immunization for 15 minutes without incident. He was provided with Vaccine Information Sheet and instruction to access the V-Safe system.   Mr. Majerus was instructed to call 911 with any severe reactions post vaccine: Difficulty breathing  Swelling of face and throat  A fast heartbeat  A bad rash all over body  Dizziness and weakness   Immunizations Administered     Name Date Dose VIS Date Route   Pfizer Covid-19 Vaccine Bivalent Booster 11/22/2021 11:07 AM 0.3 mL 07/27/2021 Intramuscular   Manufacturer: [place]   Lot: [id number]   [place]: [place], PharmD, MBA Clinical Acute Care Pharmacist

## 2021-11-29 DIAGNOSIS — N403 Nodular prostate with lower urinary tract symptoms: Secondary | ICD-10-CM | POA: Diagnosis not present

## 2021-11-29 DIAGNOSIS — N401 Enlarged prostate with lower urinary tract symptoms: Secondary | ICD-10-CM | POA: Diagnosis not present

## 2021-11-29 DIAGNOSIS — R972 Elevated prostate specific antigen [PSA]: Secondary | ICD-10-CM | POA: Diagnosis not present

## 2021-12-06 ENCOUNTER — Other Ambulatory Visit (HOSPITAL_COMMUNITY): Payer: Self-pay | Admitting: Urology

## 2021-12-06 ENCOUNTER — Other Ambulatory Visit: Payer: Self-pay | Admitting: Urology

## 2021-12-06 DIAGNOSIS — R972 Elevated prostate specific antigen [PSA]: Secondary | ICD-10-CM

## 2021-12-07 DIAGNOSIS — G4733 Obstructive sleep apnea (adult) (pediatric): Secondary | ICD-10-CM | POA: Diagnosis not present

## 2021-12-14 ENCOUNTER — Ambulatory Visit
Admission: RE | Admit: 2021-12-14 | Discharge: 2021-12-14 | Disposition: A | Discharge status: Home / Self Care | Payer: Medicare HMO | Source: Ambulatory Visit | Attending: Urology | Admitting: Urology

## 2021-12-14 ENCOUNTER — Other Ambulatory Visit: Payer: Self-pay

## 2021-12-14 DIAGNOSIS — N442 Benign cyst of testis: Secondary | ICD-10-CM | POA: Diagnosis not present

## 2021-12-14 DIAGNOSIS — R590 Localized enlarged lymph nodes: Secondary | ICD-10-CM | POA: Diagnosis not present

## 2021-12-14 DIAGNOSIS — R972 Elevated prostate specific antigen [PSA]: Secondary | ICD-10-CM | POA: Insufficient documentation

## 2021-12-14 MED ORDER — GADOBUTROL 1 MMOL/ML IV SOLN
6.0000 mL | Freq: Once | INTRAVENOUS | Status: AC | PRN
  Administered 2021-12-14: 6 mL via INTRAVENOUS

## 2021-12-16 DIAGNOSIS — R972 Elevated prostate specific antigen [PSA]: Secondary | ICD-10-CM | POA: Diagnosis not present

## 2021-12-16 DIAGNOSIS — N403 Nodular prostate with lower urinary tract symptoms: Secondary | ICD-10-CM | POA: Diagnosis not present

## 2022-01-18 DIAGNOSIS — G4733 Obstructive sleep apnea (adult) (pediatric): Secondary | ICD-10-CM | POA: Diagnosis not present

## 2022-01-26 ENCOUNTER — Other Ambulatory Visit: Payer: Medicare HMO

## 2022-02-14 ENCOUNTER — Telehealth: Payer: Self-pay

## 2022-02-14 ENCOUNTER — Encounter
Admission: RE | Admit: 2022-02-14 | Discharge: 2022-02-14 | Disposition: A | Discharge status: Home / Self Care | Payer: Medicare HMO | Source: Ambulatory Visit | Attending: Urology | Admitting: Urology

## 2022-02-14 ENCOUNTER — Other Ambulatory Visit: Payer: Self-pay

## 2022-02-14 HISTORY — DX: Essential (primary) hypertension: I10

## 2022-02-14 NOTE — Patient Instructions (Addendum)
Your procedure is scheduled on: 02/23/22 Report to [facility]. To find out your arrival time please call [phone] between 1PM - 3PM on 02/22/22.  Remember: Instructions that are not followed completely may result in serious medical risk, up to and including death, or upon the discretion of your surgeon and anesthesiologist your surgery may need to be rescheduled.     _X__ 1. Do not eat food after midnight the night before your procedure.                 No gum chewing or hard candies. You may drink clear liquids up to 2 hours                 before you are scheduled to arrive for your surgery- DO not drink clear                 liquids within 2 hours of the start of your surgery.                 Clear Liquids include:  water, apple juice without pulp, clear carbohydrate                 drink such as Clearfast or Gatorade, Black Coffee or Tea (Do not add                 anything to coffee or tea). Diabetics water only  __X__2.  On the morning of surgery brush your teeth with toothpaste and water, you                 may rinse your mouth with mouthwash if you wish.  Do not swallow any              toothpaste of mouthwash.     _X__ 3.  No Alcohol for 24 hours before or after surgery.   _X__ 4.  Do Not Smoke or use e-cigarettes For 24 Hours Prior to Your Surgery.                 Do not use any chewable tobacco products for at least 6 hours prior to                 surgery.  ____  5.  Bring all medications with you on the day of surgery if instructed.   __X__  6.  Notify your doctor if there is any change in your medical condition      (cold, fever, infections).     Do not wear jewelry, make-up, hairpins, clips or nail polish. Do not wear lotions, powders, or perfumes.  Do not shave 48 hours prior to surgery. Men may shave face and neck. Do not bring valuables to the hospital.    [facility] is not responsible for any belongings or  valuables.  Contacts, dentures/partials or body piercings may not be worn into surgery. Bring a case for your contacts, glasses or hearing aids, a denture cup will be supplied. Leave your suitcase in the car. After surgery it may be brought to your room. For patients admitted to the hospital, discharge time is determined by your treatment team.   Patients discharged the day of surgery will not be allowed to drive home.   Please read over the following fact sheets that you were given:     __X__ Take these medicines the morning of surgery with A SIP OF WATER:  1. amLODipine (NORVASC) 5 MG tablet  2.   3.   4.  5.  6.  __X__ Fleet Enema (as directed) ENEMA'S MORNING OF SURGERY UNTIL CLEAR 2-3 HOURS PRIOR TO ARRIVAL. IF CLEAR AFTER FIRST ONE DO NOT NEED TO USE THE SECOND ONE.  ____ Use CHG Soap/SAGE wipes as directed  ____ Use inhalers on the day of surgery  ____ Stop metformin/Janumet/Farxiga 2 days prior to surgery    ____ Take 1/2 of usual insulin dose the night before surgery. No insulin the morning          of surgery.   ____ Stop Blood Thinners Coumadin/Plavix/Xarelto/Pleta/Pradaxa/Eliquis/Effient/Aspirin  on   Or contact your Surgeon, Cardiologist or Medical Doctor regarding  ability to stop your blood thinners  __X__ Stop Anti-inflammatories 7 days before surgery such as Advil, Ibuprofen, Motrin,  BC or Goodies Powder, Naprosyn, Naproxen, Aleve, Aspirin    __X__ Stop all herbal supplements, fish oil or vitamin E until after surgery.    ____ Bring C-Pap to the hospital.    Shower the morning of your procedure. You may wear deodorant if you wish. Wear comfortable clothing.

## 2022-02-14 NOTE — Telephone Encounter (Signed)
Called and spoke to [place] from [employer] who states [employer] system is currently down. Will try to get a compliance report once system is back and operating.  ?

## 2022-02-15 ENCOUNTER — Encounter
Admission: RE | Admit: 2022-02-15 | Discharge: 2022-02-15 | Disposition: A | Discharge status: Home / Self Care | Payer: Medicare HMO | Source: Ambulatory Visit | Attending: Urology | Admitting: Urology

## 2022-02-15 DIAGNOSIS — Z01810 Encounter for preprocedural cardiovascular examination: Secondary | ICD-10-CM | POA: Diagnosis not present

## 2022-02-15 DIAGNOSIS — I10 Essential (primary) hypertension: Secondary | ICD-10-CM | POA: Diagnosis not present

## 2022-02-16 ENCOUNTER — Other Ambulatory Visit: Payer: Self-pay

## 2022-02-16 ENCOUNTER — Encounter: Payer: Self-pay | Admitting: Pulmonary Disease

## 2022-02-16 ENCOUNTER — Ambulatory Visit: Payer: Medicare HMO | Admitting: Pulmonary Disease

## 2022-02-16 VITALS — BP 138/80 | HR 99 | Temp 98.1°F | Ht 69.0 in | Wt 145.8 lb

## 2022-02-16 DIAGNOSIS — Z9989 Dependence on other enabling machines and devices: Secondary | ICD-10-CM | POA: Diagnosis not present

## 2022-02-16 DIAGNOSIS — Z01811 Encounter for preprocedural respiratory examination: Secondary | ICD-10-CM | POA: Diagnosis not present

## 2022-02-16 DIAGNOSIS — G4752 REM sleep behavior disorder: Secondary | ICD-10-CM | POA: Diagnosis not present

## 2022-02-16 DIAGNOSIS — G4733 Obstructive sleep apnea (adult) (pediatric): Secondary | ICD-10-CM

## 2022-02-16 NOTE — Progress Notes (Signed)
? ?[facility] ? ?Chief Complaint  ?Patient presents with  ? Follow-up  ?  Wearing cpap avg 5hr nightly-feels pressure and mask is okay.   ? ? ?Constitutional:  ?BP 138/80 (BP Location: Left Arm, Cuff Size: Normal)   Pulse 99   Temp 98.1 ?F (36.7 ?C) (Temporal)   Ht '5\' 9"'$  (1.753 m)   Wt 145 lb 12.8 oz (66.1 kg)   SpO2 98%   BMI 21.53 kg/m?  ? ?Past Medical History:  ?Allergies, Pre-diabetes, COVID 15 August 2021, Hiatal hernia, HTN, Pre-DM ? ?Past Surgical History:  ?His  has a past surgical history that includes Hernia repair; Axillary lymph node biopsy (Right, 09/18/2019); left cataract surgery (Left); and Cardiac catheterization. ? ?Brief Summary:  ?David Church is a 71 y.o. male former smoker with obstructive sleep apnea and dream enactment. ?  ? ? ? ?Subjective:  ? ?He uses CPAP nightly.  Gets about 5 to 6 hours sleep per night.  No issue with mask fit or pressure setting.  Denies sinus congestion or dry mouth.  Still gets episodes of dream enactment.  He thinks this happens about twice per month, which is less than before.  He thinks this happens now when his CPAP mask slips off. ? ?He is scheduled for prostate biopsy later this month. ? ?Physical Exam:  ? ?Appearance - well kempt  ? ?ENMT - no sinus tenderness, no oral exudate, no LAN, Mallampati 4 airway, no stridor ? ?Respiratory - equal breath sounds bilaterally, no wheezing or rales ? ?CV - s1s2 regular rate and rhythm, no murmurs ? ?Ext - no clubbing, no edema ? ?Skin - no rashes ? ?Psych - normal mood and affect ? ? ?  ?Sleep Tests:  ?HST 10/13/20 >> AHI 27.8, SpO2 low 90% ?Auto CPAP 12/08/20 to 12/23/20 >> used on 16 of 16 nights with average 9 hours 43 min.  Average AHI 5 with mean CPAP 8 and 95 th percentile CPAP 9 cm H2O. ? ?Social History:  ?He  reports that he quit smoking about 14 months ago. His smoking use included cigarettes. He has a 4.90 pack-year smoking history. He has never used smokeless  tobacco. He reports current alcohol use of about 2.0 standard drinks per week. He reports that he does not currently use drugs after having used the following drugs: Marijuana and Cocaine. ? ?Family History:  ?His family history includes Alzheimer's disease in his father; Cancer (age of onset: 33) in his mother; Diabetes in his mother; Heart attack in his father; Hypertension in his mother; Lung cancer in his maternal uncle; Stomach cancer in his maternal aunt. ?  ? ? ?Plan:  ? ?Obstructive sleep apnea. ?- he is compliant with CPAP and reports benefit from therapy ?- uses [employer] for his DME ?- he has a Luna device ?- continue auto CPAP 5 to 15 cm H2O ?- he will contact his DME to troubleshoot why his device isn't recording data ?- will call him when CPAP download results are available for review ? ?Dream enactment. ?- seems to happen now when his CPAP mask slips off ?- asked him to monitor at home ?- if he continues to have this, then he might need assessment for REM parasomnia ? ?Elevated PSA. ?- he is scheduled for prostate biopsy later this month ?- no pulmonary contraindications for him to have procedure ?- he should have close monitoring of his oxygen during the perioperative period  and resume CPAP when asleep after  surgery ? ?Time Spent Involved in Patient Care on Day of Examination:  ?27 minutes ? ?Follow up:  ? ?Patient Instructions  ?Will call with results of your CPAP download ? ?Follow up in 1 year ? ?Medication List:  ? ?Allergies as of 02/16/2022   ?No Known Allergies ?  ? ?  ?Medication List  ?  ? ?  ? Accurate as of February 16, 2022 10:19 AM. If you have any questions, ask your nurse or doctor.  ?  ?  ? ?  ? ?STOP taking these medications   ? ?Pfizer COVID-19 Vac Bivalent injection ?Generic drug: COVID-19 mRNA bivalent vaccine [employer]) ?Stopped by: Chesley Mires, MD ?  ? ?  ? ?TAKE these medications   ? ?amLODipine 5 MG tablet ?Commonly known as: Norvasc ?Take 1 tablet (5 mg total) by mouth daily. ?   ?Cholecalciferol 1.25 MG (50000 UT) capsule ?Take 1 capsule (50,000 Units total) by mouth once a week. d3 ?  ?fluticasone 50 MCG/ACT nasal spray ?Commonly known as: FLONASE ?SMARTSIG:1-2 Spray(s) Both Nares Daily ?  ?multivitamin tablet ?Take 1 tablet by mouth daily. ?  ? ?  ? ? ?Signature:  ?Chesley Mires, MD ?[facility] ?Pager - [phone] ?02/16/2022, 10:19 AM ?  ? ? ? ? ? ? ? ? ?

## 2022-02-16 NOTE — Patient Instructions (Signed)
Will call with results of your CPAP download ? ?Follow up in 1 year ?

## 2022-02-21 NOTE — H&P (Signed)
NAME: Stenzel, Sandford ?MEDICAL RECORD NO: [id number] ?ACCOUNT NO: [id number] ?DATE OF BIRTH: [birth year] ?FACILITY: [facility] ?LOCATION: [facility] ?PHYSICIAN: Otelia Limes. Yves Dill, MD ? ?History and Physical  ? ?DATE OF ADMISSION: 02/23/2022 ? ?SAME DAY SURGERY:  02/23/2022. ? ?CHIEF COMPLAINT:  Elevated PSA, prostate nodule and abnormal prostate gland MRI scan. ? ?HISTORY OF PRESENT ILLNESS:  The patient is a 71 year old African-American male who was found to have an elevated PSA of 6.61 ng/mL and a right hard nodule on digital rectal exam.  This was evaluated with MRI scan on 01/18 revealing a 26 mL prostate with ? a PI-RADS category 5 lesion of the right peripheral zone.  He comes in now for UroNav fusion biopsy of the prostate gland. ? ?PAST MEDICAL HISTORY: ?ALLERGIES:  No drug allergies. ? ?CURRENT MEDICATIONS:  Includes Vitamin D3, amlodipine, and Flonase. ? ?PAST SURGICAL HISTORY:  ?1.  Right inguinal herniorrhaphy in 1995. ?2.  Biopsy of right axillary lymph node 2022, which was benign. ? ?PAST AND CURRENT MEDICAL CONDITION: ?1.  Hypertension. ?2.  Hypercholesterolemia. ?3.  Seasonal allergies. ? ?REVIEW OF SYSTEMS:  The patient denies chest pain, shortness of breath, diabetes, stroke or heart disease. ? ?SOCIAL HISTORY:  The patient quit smoking several years ago.  He consumes alcohol occasionally. ? ?FAMILY HISTORY:  Father died at age 66 of stroke.  Mother died at age 38 of pancreatic cancer.  Brother was recently diagnosed with prostate cancer at age 36. ? ?PHYSICAL EXAMINATION:   ?VITAL SIGNS:  Weight 145 pounds, height 5 feet 9 inches. ?GENERAL:  Well-nourished African-American male in no acute distress. ?HEENT:  Sclerae were clear.  Pupils are equally round, reactive to light and accommodation.  Extraocular movements intact. ?NECK:  No palpable masses or tenderness.  No audible carotid bruits. ?LYMPHATIC:  No palpable cervical or inguinal adenopathy. ?LUNGS:  Clear to auscultation. ?CARDIOVASCULAR:  Regular  rhythm and rate without audible murmurs. ?ABDOMEN:  Soft and nontender abdomen.  No CVA tenderness. ?GENITOURINARY: Uncircumcised.  Testes smooth, nontender, approximately 20 mL in size each.  Rectal exam 30 gram prostate with a hard right nodule. ?NEUROMUSCULAR:  Alert and oriented x3. ? ?IMPRESSION:  Elevated PSA with prostate nodule and abnormal prostate gland MRI scan. ? ?PLAN: UroNav fusion biopsy of the prostate. ? ? ?PUS ?D: 02/16/2022 2:33:53 pm T: 02/16/2022 2:45:00 pm  ?JOB: [id number]/ [id number]  ?

## 2022-02-22 MED ORDER — CEFAZOLIN SODIUM-DEXTROSE 1-4 GM/50ML-% IV SOLN
1.0000 g | Freq: Once | INTRAVENOUS | Status: AC
Start: 2022-02-22 — End: 2022-02-23
  Administered 2022-02-23: 2 g via INTRAVENOUS

## 2022-02-22 MED ORDER — FAMOTIDINE 20 MG PO TABS: 20.0000 mg | ORAL_TABLET | Freq: Once | ORAL | Status: AC

## 2022-02-22 MED ORDER — ORAL CARE MOUTH RINSE: 15.0000 mL | Freq: Once | OROMUCOSAL | Status: AC

## 2022-02-22 MED ORDER — GENTAMICIN SULFATE 40 MG/ML IJ SOLN
80.0000 mg | Freq: Once | INTRAVENOUS | Status: DC
  Filled 2022-02-22: qty 2

## 2022-02-22 MED ORDER — GENTAMICIN IN SALINE 1.6-0.9 MG/ML-% IV SOLN
80.0000 mg | INTRAVENOUS | Status: AC
Start: 2022-02-23 — End: 2022-02-23
  Administered 2022-02-23: 80 mg via INTRAVENOUS
  Filled 2022-02-22: qty 50

## 2022-02-22 MED ORDER — CHLORHEXIDINE GLUCONATE 0.12 % MT SOLN: 15.0000 mL | Freq: Once | OROMUCOSAL | Status: AC

## 2022-02-22 MED ORDER — LACTATED RINGERS IV SOLN: INTRAVENOUS | Status: DC

## 2022-02-23 ENCOUNTER — Ambulatory Visit
Admission: RE | Admit: 2022-02-23 | Discharge: 2022-02-23 | Disposition: A | Discharge status: Home / Self Care | Payer: Medicare HMO | Attending: Urology | Admitting: Urology

## 2022-02-23 ENCOUNTER — Other Ambulatory Visit: Payer: Self-pay | Admitting: Orthopedic Surgery

## 2022-02-23 ENCOUNTER — Encounter: Payer: Self-pay | Admitting: Urology

## 2022-02-23 ENCOUNTER — Ambulatory Visit: Payer: Medicare HMO | Admitting: Anesthesiology

## 2022-02-23 ENCOUNTER — Other Ambulatory Visit: Payer: Self-pay

## 2022-02-23 ENCOUNTER — Encounter
Admission: RE | Disposition: A | Discharge status: Home / Self Care | Payer: Self-pay | Source: Home / Self Care | Attending: Urology

## 2022-02-23 DIAGNOSIS — I10 Essential (primary) hypertension: Secondary | ICD-10-CM | POA: Insufficient documentation

## 2022-02-23 DIAGNOSIS — R972 Elevated prostate specific antigen [PSA]: Secondary | ICD-10-CM | POA: Diagnosis not present

## 2022-02-23 DIAGNOSIS — C61 Malignant neoplasm of prostate: Secondary | ICD-10-CM | POA: Insufficient documentation

## 2022-02-23 DIAGNOSIS — Z87891 Personal history of nicotine dependence: Secondary | ICD-10-CM | POA: Diagnosis not present

## 2022-02-23 DIAGNOSIS — G4730 Sleep apnea, unspecified: Secondary | ICD-10-CM | POA: Diagnosis not present

## 2022-02-23 HISTORY — PX: PROSTATE BIOPSY: SHX241

## 2022-02-23 SURGERY — BIOPSY, PROSTATE
Anesthesia: General | Site: Prostate

## 2022-02-23 MED ORDER — FENTANYL CITRATE (PF) 100 MCG/2ML IJ SOLN: 25.0000 ug | INTRAMUSCULAR | Status: DC | PRN

## 2022-02-23 MED ORDER — PROPOFOL 10 MG/ML IV BOLUS
INTRAVENOUS | Status: DC | PRN
  Administered 2022-02-23: 150 mg via INTRAVENOUS
  Administered 2022-02-23: 120 mg via INTRAVENOUS
  Administered 2022-02-23: 20 mg via INTRAVENOUS

## 2022-02-23 MED ORDER — SUCCINYLCHOLINE CHLORIDE 200 MG/10ML IV SOSY
PREFILLED_SYRINGE | INTRAVENOUS | Status: DC | PRN
  Administered 2022-02-23: 120 mg via INTRAVENOUS

## 2022-02-23 MED ORDER — FAMOTIDINE 20 MG PO TABS
ORAL_TABLET | ORAL | Status: AC
Start: 2022-02-23 — End: 2022-02-23
  Administered 2022-02-23: 20 mg via ORAL
  Filled 2022-02-23: qty 1

## 2022-02-23 MED ORDER — PROPOFOL 500 MG/50ML IV EMUL
INTRAVENOUS | Status: AC
  Filled 2022-02-23: qty 100

## 2022-02-23 MED ORDER — LIDOCAINE HCL (CARDIAC) PF 100 MG/5ML IV SOSY
PREFILLED_SYRINGE | INTRAVENOUS | Status: DC | PRN
  Administered 2022-02-23: 40 mg via INTRAVENOUS

## 2022-02-23 MED ORDER — DEXAMETHASONE SODIUM PHOSPHATE 10 MG/ML IJ SOLN
INTRAMUSCULAR | Status: DC | PRN
  Administered 2022-02-23: 10 mg via INTRAVENOUS

## 2022-02-23 MED ORDER — SUCCINYLCHOLINE CHLORIDE 200 MG/10ML IV SOSY
PREFILLED_SYRINGE | INTRAVENOUS | Status: AC
  Filled 2022-02-23: qty 10

## 2022-02-23 MED ORDER — DEXAMETHASONE SODIUM PHOSPHATE 10 MG/ML IJ SOLN
INTRAMUSCULAR | Status: AC
  Filled 2022-02-23: qty 1

## 2022-02-23 MED ORDER — PHENYLEPHRINE 40 MCG/ML (10ML) SYRINGE FOR IV PUSH (FOR BLOOD PRESSURE SUPPORT)
PREFILLED_SYRINGE | INTRAVENOUS | Status: AC
  Filled 2022-02-23: qty 10

## 2022-02-23 MED ORDER — FENTANYL CITRATE (PF) 100 MCG/2ML IJ SOLN
INTRAMUSCULAR | Status: AC
Start: 2022-02-23 — End: ?
  Filled 2022-02-23: qty 2

## 2022-02-23 MED ORDER — EPHEDRINE SULFATE (PRESSORS) 50 MG/ML IJ SOLN
INTRAMUSCULAR | Status: DC | PRN
  Administered 2022-02-23: 7.5 mg via INTRAVENOUS

## 2022-02-23 MED ORDER — EPHEDRINE 5 MG/ML INJ
INTRAVENOUS | Status: AC
  Filled 2022-02-23: qty 5

## 2022-02-23 MED ORDER — ONDANSETRON HCL 4 MG/2ML IJ SOLN
INTRAMUSCULAR | Status: AC
  Filled 2022-02-23: qty 2

## 2022-02-23 MED ORDER — ONDANSETRON HCL 4 MG/2ML IJ SOLN: 4.0000 mg | Freq: Once | INTRAMUSCULAR | Status: DC | PRN

## 2022-02-23 MED ORDER — PHENYLEPHRINE HCL (PRESSORS) 10 MG/ML IV SOLN
INTRAVENOUS | Status: AC
  Filled 2022-02-23: qty 1

## 2022-02-23 MED ORDER — CHLORHEXIDINE GLUCONATE 0.12 % MT SOLN
OROMUCOSAL | Status: AC
  Administered 2022-02-23: 15 mL via OROMUCOSAL
  Filled 2022-02-23: qty 15

## 2022-02-23 MED ORDER — ONDANSETRON HCL 4 MG/2ML IJ SOLN
INTRAMUSCULAR | Status: DC | PRN
  Administered 2022-02-23: 4 mg via INTRAVENOUS

## 2022-02-23 MED ORDER — CEFAZOLIN SODIUM-DEXTROSE 1-4 GM/50ML-% IV SOLN
INTRAVENOUS | Status: AC
  Filled 2022-02-23: qty 50

## 2022-02-23 MED ORDER — MIDAZOLAM HCL 2 MG/2ML IJ SOLN
INTRAMUSCULAR | Status: AC
  Filled 2022-02-23: qty 2

## 2022-02-23 MED ORDER — FENTANYL CITRATE (PF) 100 MCG/2ML IJ SOLN
INTRAMUSCULAR | Status: DC | PRN
Start: 2022-02-23 — End: 2022-02-23
  Administered 2022-02-23: 50 ug via INTRAVENOUS

## 2022-02-23 MED ORDER — LEVOFLOXACIN 500 MG PO TABS: 500.0000 mg | ORAL_TABLET | Freq: Every day | ORAL | 0 refills | Status: DC

## 2022-02-23 MED ORDER — MIDAZOLAM HCL 2 MG/2ML IJ SOLN
INTRAMUSCULAR | Status: DC | PRN
  Administered 2022-02-23 (×2): 1 mg via INTRAVENOUS

## 2022-02-23 SURGICAL SUPPLY — 16 items
COVER MAYO STAND REUSABLE (DRAPES) ×2 IMPLANT
COVER TRANSDUCER UNLTRASOUND (MISCELLANEOUS) ×1 IMPLANT
CUP MEDICINE 2OZ PLAST GRAD ST (MISCELLANEOUS) ×2 IMPLANT
GLOVE SURG ENC TEXT LTX SZ7.5 (GLOVE) ×2 IMPLANT
GUIDE NDL URONAV ULTRASND S (MISCELLANEOUS) IMPLANT
GUIDE NEEDLE URONAV ULTRASND S (MISCELLANEOUS) ×1 IMPLANT
INST BIOPSY MAXCORE 18GX25 (NEEDLE) ×2 IMPLANT
PROBE URONAV BK 8808E 8818 HLD (MISCELLANEOUS) IMPLANT
STRAP SAFETY 5IN WIDE (MISCELLANEOUS) ×2 IMPLANT
SURGILUBE 2OZ TUBE FLIPTOP (MISCELLANEOUS) ×2 IMPLANT
TOWEL OR 17X26 4PK STRL BLUE (TOWEL DISPOSABLE) ×2 IMPLANT
URONAV BK 8808E 8818 PROBE HLD (MISCELLANEOUS) ×2
URONAV MRI FUSION TWO PATIENTS (MISCELLANEOUS) ×1 IMPLANT
URONAV ULTRASOUND (MISCELLANEOUS) ×1 IMPLANT
URONAV ULTRASOUND NDL GUIDE S (MISCELLANEOUS) ×2
WATER STERILE IRR 500ML POUR (IV SOLUTION) ×2 IMPLANT

## 2022-02-23 NOTE — Discharge Instructions (Addendum)
Transrectal Ultrasound-Guided Prostate Biopsy, Care After ?What can I expect after the procedure? ?After the procedure, it is common to have: ?Pain and discomfort near your butt (rectum), especially while sitting. ?Pink-colored pee (urine). This is due to small amounts of blood in your pee. ?A burning feeling while peeing. ?Blood in your poop (stool). ?Bleeding from your butt. ?Blood in your semen. ?Follow these instructions at home: ?Medicines ?Take over-the-counter and prescription medicines only as told by your doctor. ?If you were given a sedative during your procedure, do not drive or use machines until your doctor says that it is safe. A sedative is a medicine that helps you relax. ?If you were prescribed an antibiotic medicine, take it as told by your doctor. Do not stop taking it even if you start to feel better. ?Activity ?A sign showing that a person should not lift anything heavy. ? ?Return to your normal activities when your doctor says that it is safe. ?Ask your doctor when it is okay for you to have sex. ?You may have to avoid lifting. Ask your doctor how much you can safely lift. ?General instructions ?A comparison of three sample cups showing dark yellow, yellow, and pale yellow urine. ? ?Drink enough water to keep your pee pale yellow. ?Watch your pee, poop, and semen for new bleeding or bleeding that gets worse. ?Keep all follow-up visits. ?Contact a doctor if: ?You have any of these: ?Blood clots in your pee or poop. ?Blood in your pee more than 2 weeks after the procedure. ?Blood in your semen more than 2 months after the procedure. ?New or worse bleeding in your pee, poop, or semen. ?Very bad belly pain. ?Your pee smells bad or unusual. ?You have trouble peeing. ?Your lower belly feels firm. ?You have problems getting an erection. ?You feel like you may vomit (are nauseous), or you vomit. ?Get help right away if: ?You have a fever or chills. ?You have bright red pee. ?You have very bad pain that  does not get better with medicine. ?You cannot pee. ?Summary ?After this procedure, it is common to have pain and discomfort near your butt, especially while sitting. ?You may have blood in your pee and poop. ?It is common to have blood in your semen. ?Get help right away if you have a fever or chills. ?This information is not intended to replace advice given to you by your health care provider. Make sure you discuss any questions you have with your health care provider. ?Document Revised: 05/09/2021 Document Reviewed: 05/09/2021 ?Elsevier Patient Education ? 2022 [place]. ?  ? ? ?AMBULATORY SURGERY  ?DISCHARGE INSTRUCTIONS ? ? ?The drugs that you were given will stay in your system until tomorrow so for the next 24 hours you should not: ? ?Drive an automobile ?Make any legal decisions ?Drink any alcoholic beverage ? ? ?You may resume regular meals tomorrow.  Today it is better to start with liquids and gradually work up to solid foods. ? ?You may eat anything you prefer, but it is better to start with liquids, then soup and crackers, and gradually work up to solid foods. ? ? ?Please notify your doctor immediately if you have any unusual bleeding, trouble breathing, redness and pain at the surgery site, drainage, fever, or pain not relieved by medication. ? ? ? ?Additional Instructions: ? ? ? ? ? ? ? ?Please contact your physician with any problems or Same Day Surgery at [phone], Monday through Friday 6 am to 4 pm, or [facility] number at [phone].   ? ?

## 2022-02-23 NOTE — Anesthesia Preprocedure Evaluation (Signed)
Anesthesia Evaluation  ?Patient identified by MRN, date of birth, ID band ?Patient awake ? ? ? ?Reviewed: ?Allergy & Precautions, H&P , NPO status , Patient's Chart, lab work & pertinent test results, reviewed documented beta blocker date and time  ? ?Airway ?Mallampati: II ? ?TM Distance: >3 FB ?Neck ROM: full ? ? ? Dental ? ?(+) Teeth Intact ?  ?Pulmonary ?neg pulmonary ROS, sleep apnea and Continuous Positive Airway Pressure Ventilation , former smoker,  ?  ?Pulmonary exam normal ? ? ? ? ? ? ? Cardiovascular ?Exercise Tolerance: Good ?hypertension, Pt. on medications and On Medications ?negative cardio ROS ?Normal cardiovascular exam ?Rhythm:regular Rate:Normal ? ? ?  ?Neuro/Psych ?negative neurological ROS ? negative psych ROS  ? GI/Hepatic ?negative GI ROS, Neg liver ROS, hiatal hernia, GERD  ,  ?Endo/Other  ?negative endocrine ROS ? Renal/GU ?negative Renal ROS  ?negative genitourinary ?  ?Musculoskeletal ? ? Abdominal ?  ?Peds ? Hematology ?negative hematology ROS ?(+)   ?Anesthesia Other Findings ?Past Medical History: ?No date: Allergy ?No date: Closed compression fracture of L5 vertebra ([place]) ?08/24/2021: COVID-19 ?    Comment:  molnupiravir x bid x 5 days ?No date: DDD (degenerative disc disease), lumbar ?No date: Granulomatous lung disease ([place]) ?No date: Hemangioma ?No date: Hiatal hernia ?No date: Hypertension ?No date: Liver cyst ?No date: OSA on CPAP ?No date: Prediabetes ?    Comment:  6.1 10/23/18 ?Past Surgical History: ?09/18/2019: AXILLARY LYMPH NODE BIOPSY; Right ?    Comment:  Procedure: AXILLARY LYMPH NODE BIOPSY;  Surgeon: Dahlia Byes,  ?             Marjory Lies, MD;  Location: [facility] ORS;  Service: General;   ?             Laterality: Right; ?No date: CARDIAC CATHETERIZATION ?No date: HERNIA REPAIR ?    Comment:  15 years ago-right inguinal  ?No date: left cataract surgery; Left ?    Comment:  fall 2022 ?BMI   ? Body Mass Index: 21.52 kg/m?  ?  ?  Reproductive/Obstetrics ?negative OB ROS ? ?  ? ? ? ? ? ? ? ? ? ? ? ? ? ?  ?  ? ? ? ? ? ? ? ? ?Anesthesia Physical ?Anesthesia Plan ? ?ASA: 2 ? ?Anesthesia Plan: General ETT  ? ?Post-op Pain Management:   ? ?Induction:  ? ?PONV Risk Score and Plan: 3 ? ?Airway Management Planned:  ? ?Additional Equipment:  ? ?Intra-op Plan:  ? ?Post-operative Plan:  ? ?Informed Consent: I have reviewed the patients History and Physical, chart, labs and discussed the procedure including the risks, benefits and alternatives for the proposed anesthesia with the patient or authorized representative who has indicated his/her understanding and acceptance.  ? ? ? ?Dental Advisory Given ? ?Plan Discussed with: CRNA ? ?Anesthesia Plan Comments:   ? ? ? ? ? ? ?Anesthesia Quick Evaluation ? ?

## 2022-02-23 NOTE — Transfer of Care (Signed)
Immediate Anesthesia Transfer of Care Note ? ?Patient: David Church ? ?Procedure(s) Performed: PROSTATE BIOPSY URONAV (Prostate) ? ?Patient Location: PACU ? ?Anesthesia Type:General ? ?Level of Consciousness: drowsy ? ?Airway & Oxygen Therapy: Patient Spontanous Breathing and Patient connected to face mask ? ?Post-op Assessment: Report given to RN, Post -op Vital signs reviewed and stable and Patient moving all extremities ? ?Post vital signs: Reviewed and stable ? ?Last Vitals:  ?Vitals Value Taken Time  ?BP 132/88 02/23/22 0816  ?Temp 36.1 ?C 02/23/22 0816  ?Pulse 73 02/23/22 0818  ?Resp 22 02/23/22 0818  ?SpO2 98 % 02/23/22 0818  ?Vitals shown include unvalidated device data. ? ?Last Pain:  ?Vitals:  ? 02/23/22 0816  ?TempSrc:   ?PainSc: Asleep  ?   ? ?Patients Stated Pain Goal: 0 (02/23/22 3570) ? ?Complications: No notable events documented. ?

## 2022-02-23 NOTE — H&P (Signed)
Date of Initial H&P: 02/16/22 ? ?History reviewed, patient examined, no change in status, stable for surgery. ?

## 2022-02-23 NOTE — Op Note (Signed)
Preoperative diagnosis: 1.  Elevated PSA (R97.2) ?                                          2.  Prostate nodule (N40.3) ?                                          3.  Abnormal prostate gland MRI scan (D40.0) ? ?Postoperative diagnosis: Same ? ?Procedure: Vernelle Emerald fusion prostate biopsy (CPT [id number], 67124) ? ?Surgeon: Otelia Limes. Yves Dill MD ? ?Anesthesia: General ? ?Indications:See the history and physical. After informed consent the above procedure(s) were requested ? ?   ?Technique and findings: After adequate general anesthesia been obtained the patient was placed into left lateral decubitus position and digital rectal exam was performed.  The rectal vault was noted to be clear.   A right prostatic nodule was palpable.  The ultrasound probe was then placed and images acquired.  The ultrasound images were then fused with the MRI images.  The region of interest was identified and 4 core biopsies taken from this area.  At this point standard 12 core systematic biopsies were performed.  The ultrasound probe was then removed.  Blood loss was minimal.  Procedure was then terminated and patient transferred to the recovery room in stable condition.  ? ?  ? ?  ?

## 2022-02-24 LAB — SURGICAL PATHOLOGY

## 2022-02-24 NOTE — Telephone Encounter (Signed)
Created in error

## 2022-02-27 NOTE — Anesthesia Postprocedure Evaluation (Signed)
Anesthesia Post Note ? ?Patient: David Church ? ?Procedure(s) Performed: PROSTATE BIOPSY URONAV (Prostate) ? ?Patient location during evaluation: PACU ?Anesthesia Type: General ?Level of consciousness: awake and alert ?Pain management: pain level controlled ?Vital Signs Assessment: post-procedure vital signs reviewed and stable ?Respiratory status: spontaneous breathing, nonlabored ventilation, respiratory function stable and patient connected to nasal cannula oxygen ?Cardiovascular status: blood pressure returned to baseline and stable ?Postop Assessment: no apparent nausea or vomiting ?Anesthetic complications: no ? ? ?No notable events documented. ? ? ?Last Vitals:  ?Vitals:  ? 02/23/22 0836 02/23/22 0842  ?BP: 131/83 123/73  ?Pulse: 65 66  ?Resp: 16 16  ?Temp: (!) 36.1 ?C (!) 36.1 ?C  ?SpO2: 96% 96%  ?  ?Last Pain:  ?Vitals:  ? 02/23/22 0842  ?TempSrc: Temporal  ? ? ?  ?  ?  ?  ?  ?  ? ?Molli Barrows ? ? ? ? ?

## 2022-03-01 DIAGNOSIS — N5201 Erectile dysfunction due to arterial insufficiency: Secondary | ICD-10-CM | POA: Diagnosis not present

## 2022-03-01 DIAGNOSIS — R972 Elevated prostate specific antigen [PSA]: Secondary | ICD-10-CM | POA: Diagnosis not present

## 2022-03-01 DIAGNOSIS — D400 Neoplasm of uncertain behavior of prostate: Secondary | ICD-10-CM | POA: Diagnosis not present

## 2022-03-01 DIAGNOSIS — C61 Malignant neoplasm of prostate: Secondary | ICD-10-CM | POA: Diagnosis not present

## 2022-03-10 ENCOUNTER — Ambulatory Visit: Payer: Medicare HMO

## 2022-03-24 ENCOUNTER — Ambulatory Visit: Payer: Medicare HMO

## 2022-03-30 ENCOUNTER — Ambulatory Visit (INDEPENDENT_AMBULATORY_CARE_PROVIDER_SITE_OTHER): Payer: Medicare HMO

## 2022-03-30 ENCOUNTER — Ambulatory Visit: Payer: Medicare HMO

## 2022-03-30 VITALS — Ht 69.0 in | Wt 145.0 lb

## 2022-03-30 DIAGNOSIS — Z0000 Encounter for general adult medical examination without abnormal findings: Secondary | ICD-10-CM | POA: Diagnosis not present

## 2022-03-30 NOTE — Progress Notes (Signed)
Subjective:   David Church is a 71 y.o. male who presents for Medicare Annual/Subsequent preventive examination.  Review of Systems    No ROS.  Medicare Wellness Virtual Visit.  Visual/audio telehealth visit, UTA vital signs.   See social history for additional risk factors.   Cardiac Risk Factors include: advanced age (>29men, >45 women);male gender     Objective:    Today's Vitals   03/30/22 1226  Weight: 145 lb (65.8 kg)  Height: 5\' 9"  (1.753 m)   Body mass index is 21.41 kg/m.     03/30/2022   12:30 PM 02/23/2022    6:15 AM 02/14/2022    9:07 AM 04/11/2021   10:42 AM 03/09/2021    3:12 PM 04/08/2020    9:08 AM 03/08/2020    1:30 PM  Advanced Directives  Does Patient Have a Medical Advance Directive? Yes  Yes Yes Yes Yes Yes  Type of Estate agent of [place];Living will Healthcare Power of [place];Living will Healthcare Power of [place];Living will Healthcare Power of [place];Living will Healthcare Power of [place];Living will Healthcare Power of [place];Living will Healthcare Power of [place];Living will  Does patient want to make changes to medical advance directive? No - Patient declined No - Patient declined No - Patient declined No - Patient declined No - Patient declined No - Patient declined No - Patient declined  Copy of Healthcare Power of Attorney in Chart? Yes - validated most recent copy scanned in chart (See row information) No - copy requested No - copy requested No - copy requested No - copy requested  No - copy requested    Current Medications (verified) Outpatient Encounter Medications as of 03/30/2022  Medication Sig   amLODipine (NORVASC) 5 MG tablet Take 1 tablet (5 mg total) by mouth daily.   Cholecalciferol 1.25 MG (50000 UT) capsule Take 1 capsule (50,000 Units total) by mouth once a week. d3 (Patient taking differently: Take 50,000 Units by mouth every Sunday. d3)   fluticasone (FLONASE) 50 MCG/ACT nasal spray Place 1 spray  into both nostrils daily.   levofloxacin (LEVAQUIN) 500 MG tablet Take 1 tablet (500 mg total) by mouth daily. (Patient not taking: Reported on 03/30/2022)   Multiple Vitamin (MULTIVITAMIN) tablet Take 1 tablet by mouth daily. One A Day   tadalafil (CIALIS) 5 MG tablet Take 5 mg by mouth every evening.   No facility-administered encounter medications on file as of 03/30/2022.    Allergies (verified) Patient has no known allergies.   History: Past Medical History:  Diagnosis Date   Allergy    Closed compression fracture of L5 vertebra (HCC)    COVID-19 08/24/2021   molnupiravir x bid x 5 days   DDD (degenerative disc disease), lumbar    Granulomatous lung disease (HCC)    Hemangioma    Hiatal hernia    Hypertension    Liver cyst    OSA on CPAP    Prediabetes    6.1 10/23/18   Past Surgical History:  Procedure Laterality Date   AXILLARY LYMPH NODE BIOPSY Right 09/18/2019   Procedure: AXILLARY LYMPH NODE BIOPSY;  Surgeon: Pabon, Diego F, MD;  Location: [facility] ORS;  Service: General;  Laterality: Right;   CARDIAC CATHETERIZATION     HERNIA REPAIR     15  years ago-right inguinal    left cataract surgery Left    fall 2022   PROSTATE BIOPSY N/A 02/23/2022   Procedure: PROSTATE BIOPSY Addison Bailey;  Surgeon: Orson Ape, MD;  Location: [facility]  ORS;  Service: Urology;  Laterality: N/A;   Family History  Problem Relation Age of Onset   Cancer Mother 61   Diabetes Mother    Hypertension Mother    Alzheimer's disease Father    Heart attack Father    Stomach cancer Maternal Aunt    Lung cancer Maternal Uncle    Social History   Socioeconomic History   Marital status: Married    Spouse name: Not on file   Number of children: Not on file   Years of education: Not on file   Highest education level: Not on file  Occupational History   Not on file  Tobacco Use   Smoking status: Former    Packs/day: 0.10    Years: 49.00    Pack years: 4.90    Types: Cigarettes    Quit date:  11/2020    Years since quitting: 1.3   Smokeless tobacco: Never  Vaping Use   Vaping Use: Never used  Substance and Sexual Activity   Alcohol use: Yes    Alcohol/week: 2.0 standard drinks    Types: 2 Cans of beer per week    Comment: 2 beers a day and wine on special occasion   Drug use: Not Currently    Types: Marijuana, Cocaine    Comment: did drugs but quit in early 30's   Sexual activity: Yes  Other Topics Concern   Not on file  Social History Narrative   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    04/2019 moved from [place]    2 kids son 57, daughter 24 as of 01/13/20 daughter in [place]    Son [place]    2 grandsons    Likes to cruise    Social Determinants of Corporate investment banker Strain: Low Risk    Difficulty of Paying Living Expenses: Not hard at all  Food Insecurity: No Food Insecurity   Worried About Programme researcher, broadcasting/film/video in the Last Year: Never true   Barista in the Last Year: Never true  Transportation Needs: No Transportation Needs   Lack of Transportation (Medical): No   Lack of Transportation (Non-Medical): No  Physical Activity: Sufficiently Active   Days of Exercise per Week: 4 days   Minutes of Exercise per Session: 60 min  Stress: No Stress Concern Present   Feeling of Stress : Not at all  Social Connections: Unknown   Frequency of Communication with Friends and Family: Not on file   Frequency of Social Gatherings with Friends and Family: Not on file   Attends Religious Services: Not on file   Active Member of Clubs or Organizations: Not on file   Attends Banker Meetings: Not on file   Marital Status: Married   Tobacco Counseling Counseling given: Not Answered  Clinical Intake: Pre-visit preparation completed: Yes        Diabetes: No  How often do you need to have someone help you when you read instructions, pamphlets, or other written materials from your doctor or pharmacy?: 1 - Never  Interpreter  Needed?: No    Activities of Daily Living    03/30/2022   12:31 PM 02/14/2022    9:09 AM  In your present state of health, do you have any difficulty performing the following activities:  Hearing? 0   Vision? 0   Difficulty concentrating or making decisions? 0   Walking or climbing stairs? 0  Dressing or bathing? 0   Doing errands, shopping? 0 0  Preparing Food and eating ? N   Using the Toilet? N   In the past six months, have you accidently leaked urine? N   Do you have problems with loss of bowel control? N   Managing your Medications? N   Managing your Finances? N   Housekeeping or managing your Housekeeping? N    Patient Care Team: McLean-Scocuzza, Pasty Spillers, MD as PCP - General (Internal Medicine)  Indicate any recent Medical Services you may have received from other than [facility] providers in the past year (date may be approximate).     Assessment:   This is a routine wellness examination for Echo.  Virtual Visit via Telephone Note  I connected with  David Church on 03/30/22 at 10:00 AM EDT by telephone and verified that I am speaking with the correct person using two identifiers.  Persons participating in the virtual visit: patient/wife/Nurse Health Advisor   I discussed the limitations of performing an evaluation and management service by telehealth. The patient expressed understanding and agreed to proceed. We continued and completed visit with audio only. Some vital signs may be absent or patient reported.   Hearing/Vision screen Hearing Screening - Comments:: Patient is able to hear conversational tones without difficulty.  No issues reported. Vision Screening - Comments:: Wears corrective lenses Cataract extraction, bilateral They have seen their ophthalmologist in the last 12 months.   Dietary issues and exercise activities discussed: Current Exercise Habits: Home exercise routine, Type of exercise: strength training/weights, Time (Minutes): 60, Frequency  (Times/Week): 4, Weekly Exercise (Minutes/Week): 240, Intensity: Mild  Healthy diet Good water intake   Goals Addressed             This Visit's Progress    Maintain Healthy Lifestyle       Stay active Healthy diet Stay hydrated       Depression Screen    03/30/2022   12:28 PM 03/09/2021    3:11 PM 05/26/2020    2:54 PM 03/18/2020    2:01 PM 03/08/2020    1:31 PM 01/13/2020    1:23 PM 07/11/2019   10:34 AM  PHQ 2/9 Scores  PHQ - 2 Score 0 0 0 0 0 0 1  PHQ- 9 Score       1    Fall Risk    03/30/2022   12:30 PM 03/09/2021    3:14 PM 11/18/2020    9:04 AM 05/26/2020    2:54 PM 03/18/2020    2:01 PM  Fall Risk   Falls in the past year? 0 0 0 0 0  Number falls in past yr: 0 0 0 0 0  Injury with Fall?  0 0 0 0  Risk for fall due to :    No Fall Risks   Follow up Falls evaluation completed Falls evaluation completed Falls evaluation completed Falls evaluation completed Falls evaluation completed   FALL RISK PREVENTION PERTAINING TO THE HOME: Home free of loose throw rugs in walkways, pet beds, electrical cords, etc? Yes  Adequate lighting in your home to reduce risk of falls? Yes   ASSISTIVE DEVICES UTILIZED TO PREVENT FALLS: Use of a cane, walker or w/c? No   TIMED UP AND GO: Was the test performed? No .   Cognitive Function:  Patient is alert and oriented x3.       03/08/2020    1:32 PM  6CIT Screen  Months in reverse 4 points  Immunizations Immunization History  Administered Date(s) Administered   PFIZER Comirnaty(Gray Top)Covid-19 Tri-Sucrose Vaccine 07/01/2021   PFIZER(Purple Top)SARS-COV-2 Vaccination 01/29/2020, 02/19/2020, 08/27/2020   Pfizer Covid-19 Vaccine Bivalent Booster 68yrs & up 11/22/2021   Screening Tests Health Maintenance  Topic Date Due   Zoster Vaccines- Shingrix (1 of 2) 05/19/2022 (Originally 01/02/1970)   Pneumonia Vaccine 31+ Years old (1 - PCV) 11/01/2022 (Originally 01/01/2018)   INFLUENZA VACCINE  06/27/2022   COLONOSCOPY (Pts  45-42yrs Insurance coverage will need to be confirmed)  12/28/2028   COVID-19 Vaccine  Completed   Hepatitis C Screening  Completed   HPV VACCINES  Aged Out   TETANUS/TDAP  Discontinued   Health Maintenance There are no preventive care reminders to display for this patient.  Lung Cancer Screening: (Low Dose CT Chest recommended if Age 78-80 years, 30 pack-year currently smoking OR have quit w/in 15years.) does not qualify.   Vision Screening: Recommended annual ophthalmology exams for early detection of glaucoma and other disorders of the eye.  Dental Screening: Recommended annual dental exams for proper oral hygiene  Community Resource Referral / Chronic Care Management: CRR required this visit?  No   CCM required this visit?  No      Plan:   Keep all routine maintenance appointments.   I have personally reviewed and noted the following in the patient's chart:   Medical and social history Use of alcohol, tobacco or illicit drugs  Current medications and supplements including opioid prescriptions. Patient is not currently taking opioid prescriptions. Functional ability and status Nutritional status Physical activity Advanced directives List of other physicians Hospitalizations, surgeries, and ER visits in previous 12 months Vitals Screenings to include cognitive, depression, and falls Referrals and appointments  In addition, I have reviewed and discussed with patient certain preventive protocols, quality metrics, and best practice recommendations. A written personalized care plan for preventive services as well as general preventive health recommendations were provided to patient.     Ashok Pall, LPN   0/07/8118

## 2022-03-30 NOTE — Patient Instructions (Addendum)
?  David Church , ?Thank you for taking time to come for your Medicare Wellness Visit. I appreciate your ongoing commitment to your health goals. Please review the following plan we discussed and let me know if I can assist you in the future.  ? ?These are the goals we discussed: ? Goals   ? ?  Maintain Healthy Lifestyle   ?  Stay active ?Healthy diet ?Stay hydrated ?  ? ?  ?  ?This is a list of the screening recommended for you and due dates:  ?Health Maintenance  ?Topic Date Due  ? Zoster (Shingles) Vaccine (1 of 2) 05/19/2022*  ? Pneumonia Vaccine (1 - PCV) 11/01/2022*  ? Flu Shot  06/27/2022  ? Colon Cancer Screening  12/28/2028  ? COVID-19 Vaccine  Completed  ? Hepatitis C Screening: USPSTF Recommendation to screen - Ages 36-79 yo.  Completed  ? HPV Vaccine  Aged Out  ? Tetanus Vaccine  Discontinued  ?*Topic was postponed. The date shown is not the original due date.  ?  ?

## 2022-04-04 ENCOUNTER — Ambulatory Visit: Payer: Medicare HMO | Admitting: Internal Medicine

## 2022-04-05 ENCOUNTER — Encounter
Admission: RE | Admit: 2022-04-05 | Discharge: 2022-04-05 | Disposition: A | Discharge status: Home / Self Care | Payer: Medicare HMO | Source: Ambulatory Visit | Attending: Urology | Admitting: Urology

## 2022-04-05 ENCOUNTER — Other Ambulatory Visit: Payer: Self-pay

## 2022-04-05 VITALS — Ht 69.0 in | Wt 143.3 lb

## 2022-04-05 DIAGNOSIS — I10 Essential (primary) hypertension: Secondary | ICD-10-CM

## 2022-04-05 NOTE — Patient Instructions (Addendum)
Your procedure is scheduled on: Thursday 04/13/2022 ?Report to the Registration Desk on the 1st floor of the [facility]. ?To find out your arrival time, please call [phone] between 1PM - 3PM on: Wednesday 04/12/2022 ? ?REMEMBER: ?Instructions that are not followed completely may result in serious medical risk, up to and including death; or upon the discretion of your surgeon and anesthesiologist your surgery may need to be rescheduled. ? ?Do not eat food after midnight the night before surgery.  ?No gum chewing, lozengers or hard candies. ? ?You may however, drink water up to 2 hours before you are scheduled to arrive for your surgery. Do not drink anything within 2 hours of your scheduled arrival time. ? ? ?TAKE THESE MEDICATIONS THE MORNING OF SURGERY WITH A SIP OF WATER: ?- amLODipine (NORVASC) 5 MG   ? ?One week prior to surgery: ?Stop Anti-inflammatories (NSAIDS) such as Advil, Aleve, Ibuprofen, Motrin, Naproxen, Naprosyn and Aspirin based products such as Excedrin, Goodys Powder, BC Powder. You may however, continue to take Tylenol if needed for pain up until the day of surgery. ? ?Stop any over the counter vitamins and supplements until after surgery. ? ? ?No Alcohol for 24 hours before or after surgery. ? ?No Smoking including e-cigarettes for 24 hours prior to surgery.  ?No chewable tobacco products for at least 6 hours prior to surgery.  ?No nicotine patches on the day of surgery. ? ?Do not use any "recreational" drugs for at least a week prior to your surgery.  ?Please be advised that the combination of cocaine and anesthesia may have negative outcomes, up to and including death. ?If you test positive for cocaine, your surgery will be cancelled. ? ?On the morning of surgery brush your teeth with toothpaste and water, you may rinse your mouth with mouthwash if you wish. ?Do not swallow any toothpaste or mouthwash. ? ?Use CHG Soap or wipes as directed on instruction sheet. ? ?Do not wear jewelry,  make-up, hairpins, clips or nail polish. ? ?Do not wear lotions, powders, or perfumes. You may wear deodorant. ? ?Do not shave body from the neck down 48 hours prior to surgery just in case you cut yourself which could leave a site for infection.  ?Also, freshly shaved skin may become irritated if using the CHG soap. ? ?Do not bring valuables to the hospital. [facility] is not responsible for any missing/lost belongings or valuables.  ? ?Fleets enema or bowel prep as directed. ENEMA'S MORNING OF SURGERY UNTIL CLEAR 2-3 HOURS PRIOR TO ARRIVAL. IF CLEAR AFTER FIRST ONE DO NOT NEED TO USE THE SECOND ONE.  ? ?Bring your C-PAP to the hospital with you in case you may have to spend the night.  ? ?Notify your doctor if there is any change in your medical condition (cold, fever, infection). ? ?Wear comfortable clothing (specific to your surgery type) to the hospital. ? ?If you are being discharged the day of surgery, you will not be allowed to drive home. ?You will need a responsible adult (18 years or older) to drive you home and stay with you that night.  ? ?If you are taking public transportation, you will need to have a responsible adult (18 years or older) with you. ?Please confirm with your physician that it is acceptable to use public transportation.  ? ?Please call the [facility]. at [phone] if you have any questions about these instructions. ? ?Surgery Visitation Policy: ? ?Patients undergoing a surgery or procedure may  have two family members or support persons with them as long as the person is not COVID-19 positive or experiencing its symptoms.  ? ?Inpatient Visitation:   ? ?Visiting hours are 7 a.m. to 8 p.m. ?Up to four visitors are allowed at one time in a patient room, including children. The visitors may rotate out with other people during the day. One designated support person (adult) may remain overnight.   ?

## 2022-04-07 ENCOUNTER — Encounter
Admission: RE | Admit: 2022-04-07 | Discharge: 2022-04-07 | Disposition: A | Discharge status: Home / Self Care | Payer: Medicare HMO | Source: Ambulatory Visit | Attending: Physical Medicine and Rehabilitation | Admitting: Physical Medicine and Rehabilitation

## 2022-04-07 DIAGNOSIS — I10 Essential (primary) hypertension: Secondary | ICD-10-CM | POA: Diagnosis not present

## 2022-04-07 DIAGNOSIS — Z01812 Encounter for preprocedural laboratory examination: Secondary | ICD-10-CM | POA: Diagnosis not present

## 2022-04-07 LAB — CBC
HCT: 48.6 % (ref 39.0–52.0)
Hemoglobin: 16.2 g/dL (ref 13.0–17.0)
MCH: 29.8 pg (ref 26.0–34.0)
MCHC: 33.3 g/dL (ref 30.0–36.0)
MCV: 89.3 fL (ref 80.0–100.0)
Platelets: 199 10*3/uL (ref 150–400)
RBC: 5.44 MIL/uL (ref 4.22–5.81)
RDW: 12.5 % (ref 11.5–15.5)
WBC: 3.5 10*3/uL — ABNORMAL LOW (ref 4.0–10.5)
nRBC: 0 % (ref 0.0–0.2)

## 2022-04-07 LAB — COMPREHENSIVE METABOLIC PANEL
ALT: 16 U/L (ref 0–44)
AST: 22 U/L (ref 15–41)
Albumin: 4.1 g/dL (ref 3.5–5.0)
Alkaline Phosphatase: 53 U/L (ref 38–126)
Anion gap: 6 (ref 5–15)
BUN: 11 mg/dL (ref 8–23)
CO2: 28 mmol/L (ref 22–32)
Calcium: 9 mg/dL (ref 8.9–10.3)
Chloride: 106 mmol/L (ref 98–111)
Creatinine, Ser: 1.25 mg/dL — ABNORMAL HIGH (ref 0.61–1.24)
GFR, Estimated: >60 mL/min (ref >60)
Glucose, Bld: 109 mg/dL — ABNORMAL HIGH (ref 70–99)
Potassium: 4.2 mmol/L (ref 3.5–5.1)
Sodium: 140 mmol/L (ref 135–145)
Total Bilirubin: 0.5 mg/dL (ref 0.3–1.2)
Total Protein: 7.8 g/dL (ref 6.5–8.1)

## 2022-04-10 NOTE — H&P (Signed)
NAME: David Church, David Church ?MEDICAL RECORD NO: [id number] ?ACCOUNT NO: [id number] ?DATE OF BIRTH: 07-09-1951 ?FACILITY: [facility] ?LOCATION: [facility] ?PHYSICIAN: Otelia Limes. Yves Dill, MD ? ?History and Physical  ? ?DATE OF ADMISSION: 04/13/2022 ? ?Same day surgery 04/13/2022. ? ?CHIEF COMPLAINT:  Prostate cancer. ? ?HISTORY OF PRESENT ILLNESS:  The patient is a 71 year old African-American male with an elevated PSA of 6.61 ng/mL.  He was evaluated with fusion MRI scan biopsy using UroNav on 03/30th.  MRI scan indicated that he had a prostate size of 26 mL with a  ?PIRAD category 5 lesion of the right peripheral zone.  Pathology report indicated Gleason's grade 4+3 adenocarcinoma involving the right lateral apex and right lateral base.  He had Gleason's grade 3+3 adenocarcinoma involving the region of interest.  He ? had initially preferred active surveillance, but changed his mind and now comes in for HIFU treatment. ? ?PAST MEDICAL HISTORY: ?ALLERGIES:  No drug allergies. ? ?CURRENT MEDICATIONS:  Included vitamin D3, amlodipine, and Flonase. ? ?PAST SURGICAL HISTORY:  ?1.  Right inguinal herniorrhaphy in 1995. ?2.  Biopsy of right axillary lymph node 2002, which was benign. ? ?PAST AND CURRENT MEDICAL CONDITIONS:  ?1.  Hypertension. ?2.  Hypercholesterolemia. ?3.  Seasonal allergies. ? ?REVIEW OF SYSTEMS:  The patient denies chest pain, shortness of breath, diabetes, stroke or heart disease. ? ?SOCIAL HISTORY:  The patient quit smoking several years ago.  He consumes alcohol rarely. ? ?FAMILY HISTORY:  Father died at age 63 of stroke.  Mother died at age 28 of pancreatic cancer.  Brother was recently diagnosed with prostate cancer at age 53. ? ?PHYSICAL EXAMINATION:   ?VITAL SIGNS:  Weight 145 pounds, height 5 feet 9 inches. ?GENERAL:  Well-nourished African-American male in no acute distress. ?HEENT:  Sclerae were clear.  Pupils were equally round, reactive to light and accommodation.  Extraocular movements are intact. ?NECK:   No palpable masses or tenderness.  No audible carotid bruits. ?LYMPHATIC:  No palpable cervical or inguinal adenopathy. ?LUNGS:  Clear to auscultation. ?CARDIOVASCULAR:  Regular rhythm and rate without audible murmurs. ?ABDOMEN:  Soft, nontender abdomen.  No CVA tenderness. ?GENITOURINARY:  Uncircumcised.  Testes were smooth, nontender, approximately 20 mL in size each.  Rectal exam 30 gram prostate with a hard right nodule. ? ?IMPRESSION:  Stage T1c, Gleason grade 4+3 adenocarcinoma of the prostate. ? ?PLAN: HIFU treatment of the prostate gland. ? ? ?PUS ?D: 04/07/2022 1:44:41 pm T: 04/07/2022 2:09:00 pm  ?JOB: [id number]/ [id number]  ?

## 2022-04-13 ENCOUNTER — Ambulatory Visit: Payer: Medicare HMO | Admitting: Certified Registered Nurse Anesthetist

## 2022-04-13 ENCOUNTER — Encounter
Admission: RE | Disposition: A | Discharge status: Home / Self Care | Payer: Self-pay | Source: Home / Self Care | Attending: Urology

## 2022-04-13 ENCOUNTER — Ambulatory Visit
Admission: RE | Admit: 2022-04-13 | Discharge: 2022-04-13 | Disposition: A | Discharge status: Home / Self Care | Payer: Medicare HMO | Attending: Urology | Admitting: Urology

## 2022-04-13 ENCOUNTER — Encounter: Payer: Self-pay | Admitting: Internal Medicine

## 2022-04-13 ENCOUNTER — Encounter: Payer: Self-pay | Admitting: Urology

## 2022-04-13 DIAGNOSIS — Z79899 Other long term (current) drug therapy: Secondary | ICD-10-CM | POA: Diagnosis not present

## 2022-04-13 DIAGNOSIS — Z8042 Family history of malignant neoplasm of prostate: Secondary | ICD-10-CM | POA: Diagnosis not present

## 2022-04-13 DIAGNOSIS — Z8616 Personal history of COVID-19: Secondary | ICD-10-CM | POA: Insufficient documentation

## 2022-04-13 DIAGNOSIS — Z87891 Personal history of nicotine dependence: Secondary | ICD-10-CM | POA: Diagnosis not present

## 2022-04-13 DIAGNOSIS — K449 Diaphragmatic hernia without obstruction or gangrene: Secondary | ICD-10-CM | POA: Insufficient documentation

## 2022-04-13 DIAGNOSIS — G4733 Obstructive sleep apnea (adult) (pediatric): Secondary | ICD-10-CM | POA: Diagnosis not present

## 2022-04-13 DIAGNOSIS — I10 Essential (primary) hypertension: Secondary | ICD-10-CM | POA: Diagnosis not present

## 2022-04-13 DIAGNOSIS — C61 Malignant neoplasm of prostate: Secondary | ICD-10-CM | POA: Diagnosis not present

## 2022-04-13 HISTORY — PX: HIGH INTENSITY FOCUSED ULTRASOUND (HIFU) OF THE PROSTATE: SHX6793

## 2022-04-13 HISTORY — DX: Malignant neoplasm of prostate: C61

## 2022-04-13 SURGERY — ABLATION, PROSTATE, RECTAL APPROACH, USING HIGH-INTENSITY FOCUSED ULTRASOUND
Anesthesia: General

## 2022-04-13 MED ORDER — LIDOCAINE HCL (CARDIAC) PF 100 MG/5ML IV SOSY
PREFILLED_SYRINGE | INTRAVENOUS | Status: DC | PRN
  Administered 2022-04-13: 80 mg via INTRAVENOUS

## 2022-04-13 MED ORDER — PHENYLEPHRINE 80 MCG/ML (10ML) SYRINGE FOR IV PUSH (FOR BLOOD PRESSURE SUPPORT)
PREFILLED_SYRINGE | INTRAVENOUS | Status: DC | PRN
Start: 2022-04-13 — End: 2022-04-13
  Administered 2022-04-13 (×4): 80 ug via INTRAVENOUS

## 2022-04-13 MED ORDER — ONDANSETRON HCL 4 MG/2ML IJ SOLN
INTRAMUSCULAR | Status: DC | PRN
  Administered 2022-04-13: 4 mg via INTRAVENOUS

## 2022-04-13 MED ORDER — DOCUSATE SODIUM 100 MG PO CAPS: 200.0000 mg | ORAL_CAPSULE | Freq: Two times a day (BID) | ORAL | 3 refills | Status: DC

## 2022-04-13 MED ORDER — PROPOFOL 10 MG/ML IV BOLUS
INTRAVENOUS | Status: DC | PRN
Start: 2022-04-13 — End: 2022-04-13
  Administered 2022-04-13: 150 mg via INTRAVENOUS

## 2022-04-13 MED ORDER — PROMETHAZINE HCL 25 MG/ML IJ SOLN: 6.2500 mg | INTRAMUSCULAR | Status: DC | PRN

## 2022-04-13 MED ORDER — FENTANYL CITRATE (PF) 100 MCG/2ML IJ SOLN
INTRAMUSCULAR | Status: DC | PRN
Start: 2022-04-13 — End: 2022-04-13
  Administered 2022-04-13: 25 ug via INTRAVENOUS
  Administered 2022-04-13: 50 ug via INTRAVENOUS

## 2022-04-13 MED ORDER — GLYCOPYRROLATE 0.2 MG/ML IJ SOLN
INTRAMUSCULAR | Status: DC | PRN
Start: 2022-04-13 — End: 2022-04-13
  Administered 2022-04-13: .2 mg via INTRAVENOUS

## 2022-04-13 MED ORDER — ACETAMINOPHEN 10 MG/ML IV SOLN: 1000.0000 mg | Freq: Once | INTRAVENOUS | Status: DC | PRN

## 2022-04-13 MED ORDER — PHENYLEPHRINE HCL-NACL 20-0.9 MG/250ML-% IV SOLN
INTRAVENOUS | Status: DC | PRN
  Administered 2022-04-13: 10 ug/min via INTRAVENOUS

## 2022-04-13 MED ORDER — ROCURONIUM BROMIDE 100 MG/10ML IV SOLN
INTRAVENOUS | Status: DC | PRN
  Administered 2022-04-13: 60 mg via INTRAVENOUS
  Administered 2022-04-13: 10 mg via INTRAVENOUS

## 2022-04-13 MED ORDER — SULFAMETHOXAZOLE-TRIMETHOPRIM 800-160 MG PO TABS: 1.0000 | ORAL_TABLET | Freq: Two times a day (BID) | ORAL | 0 refills | Status: DC

## 2022-04-13 MED ORDER — ACETAMINOPHEN 10 MG/ML IV SOLN
INTRAVENOUS | Status: AC
  Filled 2022-04-13: qty 100

## 2022-04-13 MED ORDER — FENTANYL CITRATE (PF) 100 MCG/2ML IJ SOLN
25.0000 ug | INTRAMUSCULAR | Status: DC | PRN
  Administered 2022-04-13: 50 ug via INTRAVENOUS

## 2022-04-13 MED ORDER — CEFAZOLIN SODIUM-DEXTROSE 1-4 GM/50ML-% IV SOLN
1.0000 g | Freq: Once | INTRAVENOUS | Status: AC
  Administered 2022-04-13: 1 g via INTRAVENOUS

## 2022-04-13 MED ORDER — DROPERIDOL 2.5 MG/ML IJ SOLN: 0.6250 mg | Freq: Once | INTRAMUSCULAR | Status: DC | PRN

## 2022-04-13 MED ORDER — PROPOFOL 10 MG/ML IV BOLUS
INTRAVENOUS | Status: AC
  Filled 2022-04-13: qty 20

## 2022-04-13 MED ORDER — CHLORHEXIDINE GLUCONATE 0.12 % MT SOLN: 15.0000 mL | Freq: Once | OROMUCOSAL | Status: AC

## 2022-04-13 MED ORDER — FENTANYL CITRATE (PF) 100 MCG/2ML IJ SOLN
INTRAMUSCULAR | Status: AC
Start: 2022-04-13 — End: ?
  Filled 2022-04-13: qty 2

## 2022-04-13 MED ORDER — OXYCODONE HCL 5 MG PO TABS
ORAL_TABLET | ORAL | Status: AC
  Filled 2022-04-13: qty 1

## 2022-04-13 MED ORDER — SUGAMMADEX SODIUM 200 MG/2ML IV SOLN
INTRAVENOUS | Status: DC | PRN
Start: 2022-04-13 — End: 2022-04-13
  Administered 2022-04-13: 200 mg via INTRAVENOUS

## 2022-04-13 MED ORDER — OXYCODONE HCL 5 MG/5ML PO SOLN: 5.0000 mg | Freq: Once | ORAL | Status: AC | PRN

## 2022-04-13 MED ORDER — LACTATED RINGERS IV SOLN: INTRAVENOUS | Status: DC

## 2022-04-13 MED ORDER — CHLORHEXIDINE GLUCONATE 0.12 % MT SOLN
OROMUCOSAL | Status: AC
  Administered 2022-04-13: 15 mL via OROMUCOSAL
  Filled 2022-04-13: qty 15

## 2022-04-13 MED ORDER — TAMSULOSIN HCL 0.4 MG PO CAPS: 0.4000 mg | ORAL_CAPSULE | Freq: Every day | ORAL | 11 refills | Status: DC

## 2022-04-13 MED ORDER — FAMOTIDINE 20 MG PO TABS
ORAL_TABLET | ORAL | Status: AC
  Administered 2022-04-13: 20 mg via ORAL
  Filled 2022-04-13: qty 1

## 2022-04-13 MED ORDER — PHENYLEPHRINE HCL-NACL 20-0.9 MG/250ML-% IV SOLN
INTRAVENOUS | Status: AC
  Filled 2022-04-13: qty 250

## 2022-04-13 MED ORDER — FENTANYL CITRATE (PF) 100 MCG/2ML IJ SOLN
INTRAMUSCULAR | Status: AC
  Administered 2022-04-13: 25 ug via INTRAVENOUS
  Filled 2022-04-13: qty 2

## 2022-04-13 MED ORDER — HYOSCYAMINE SULFATE SL 0.125 MG SL SUBL: 0.1250 mg | SUBLINGUAL_TABLET | SUBLINGUAL | 3 refills | Status: DC | PRN

## 2022-04-13 MED ORDER — FAMOTIDINE 20 MG PO TABS: 20.0000 mg | ORAL_TABLET | Freq: Once | ORAL | Status: AC

## 2022-04-13 MED ORDER — MIDAZOLAM HCL 2 MG/2ML IJ SOLN
INTRAMUSCULAR | Status: DC | PRN
Start: 2022-04-13 — End: 2022-04-13
  Administered 2022-04-13: 2 mg via INTRAVENOUS

## 2022-04-13 MED ORDER — MIDAZOLAM HCL 2 MG/2ML IJ SOLN
INTRAMUSCULAR | Status: AC
Start: 2022-04-13 — End: ?
  Filled 2022-04-13: qty 2

## 2022-04-13 MED ORDER — ACETAMINOPHEN 10 MG/ML IV SOLN
INTRAVENOUS | Status: DC | PRN
  Administered 2022-04-13: 1000 mg via INTRAVENOUS

## 2022-04-13 MED ORDER — ORAL CARE MOUTH RINSE: 15.0000 mL | Freq: Once | OROMUCOSAL | Status: AC

## 2022-04-13 MED ORDER — CEFAZOLIN SODIUM-DEXTROSE 1-4 GM/50ML-% IV SOLN
INTRAVENOUS | Status: AC
  Filled 2022-04-13: qty 50

## 2022-04-13 MED ORDER — OXYCODONE HCL 5 MG PO TABS
5.0000 mg | ORAL_TABLET | Freq: Once | ORAL | Status: AC | PRN
  Administered 2022-04-13: 5 mg via ORAL

## 2022-04-13 MED ORDER — DEXAMETHASONE SODIUM PHOSPHATE 10 MG/ML IJ SOLN
INTRAMUSCULAR | Status: DC | PRN
  Administered 2022-04-13: 10 mg via INTRAVENOUS

## 2022-04-13 SURGICAL SUPPLY — 22 items
CATH FOL 2WAY LX 16X5 (CATHETERS) IMPLANT
FEE RENTAL SONABLATE (MISCELLANEOUS) ×1 IMPLANT
FEE SONABLATE RENTAL (MISCELLANEOUS) ×2 IMPLANT
FEE SONABLATE TECHNICIAN (MISCELLANEOUS) ×2 IMPLANT
FEE TECHNICIAN SONABLATE (MISCELLANEOUS) ×1 IMPLANT
GAUZE 4X4 16PLY ~~LOC~~+RFID DBL (SPONGE) ×2 IMPLANT
GLOVE BIO SURGEON STRL SZ7.5 (GLOVE) ×2 IMPLANT
GOWN STRL REUS W/ TWL LRG LVL3 (GOWN DISPOSABLE) IMPLANT
GOWN STRL REUS W/ TWL XL LVL3 (GOWN DISPOSABLE) IMPLANT
GOWN STRL REUS W/TWL LRG LVL3 (GOWN DISPOSABLE)
GOWN STRL REUS W/TWL XL LVL3 (GOWN DISPOSABLE)
HOLDER FOLEY CATH W/STRAP (MISCELLANEOUS) ×2 IMPLANT
KIT TURNOVER KIT A (KITS) ×2 IMPLANT
LABEL OR SOLS (LABEL) ×1 IMPLANT
MANIFOLD NEPTUNE II (INSTRUMENTS) IMPLANT
PACK CYSTO AR (MISCELLANEOUS) ×1 IMPLANT
PACK PROSTATE SONABLATE INSERT (MISCELLANEOUS) ×2 IMPLANT
SYR 10ML LL (SYRINGE) IMPLANT
SYR TOOMEY 50ML (SYRINGE) IMPLANT
TRAY FOLEY MTR SLVR 16FR STAT (SET/KITS/TRAYS/PACK) ×2 IMPLANT
TUBING CONNECTING 10 (TUBING) IMPLANT
WATER STERILE IRR 1000ML POUR (IV SOLUTION) ×2 IMPLANT

## 2022-04-13 NOTE — Anesthesia Procedure Notes (Signed)
Procedure Name: Intubation Date/Time: 04/13/2022 7:36 AM Performed by: Lily Peer, Aviana Shevlin, CRNA Pre-anesthesia Checklist: Patient identified, Emergency Drugs available, Suction available and Patient being monitored Patient Re-evaluated:Patient Re-evaluated prior to induction Oxygen Delivery Method: Circle system utilized Preoxygenation: Pre-oxygenation with 100% oxygen Induction Type: IV induction Ventilation: Mask ventilation without difficulty Laryngoscope Size: McGraph and 4 Grade View: Grade I Tube type: Oral Tube size: 7.5 mm Number of attempts: 1 Airway Equipment and Method: Stylet Placement Confirmation: ETT inserted through vocal cords under direct vision, positive ETCO2 and breath sounds checked- equal and bilateral Secured at: 21 cm Tube secured with: Tape Dental Injury: Teeth and Oropharynx as per pre-operative assessment

## 2022-04-13 NOTE — H&P (Signed)
Date of Initial H&P: 04/07/22  History reviewed, patient examined, no change in status, stable for surgery.

## 2022-04-13 NOTE — Transfer of Care (Signed)
Immediate Anesthesia Transfer of Care Note  Patient: David Church  Procedure(s) Performed: HIGH INTENSITY FOCUSED ULTRASOUND (HIFU) OF THE PROSTATE  Patient Location: PACU  Anesthesia Type:General  Level of Consciousness: drowsy  Airway & Oxygen Therapy: Patient Spontanous Breathing and Patient connected to face mask oxygen  Post-op Assessment: Report given to RN and Post -op Vital signs reviewed and stable  Post vital signs: Reviewed and stable  Last Vitals:  Vitals Value Taken Time  BP 146/84 04/13/22 0932  Temp    Pulse 63 04/13/22 0932  Resp 18 04/13/22 0932  SpO2 100 % 04/13/22 0932  Vitals shown include unvalidated device data.  Last Pain:  Vitals:   04/13/22 0623  TempSrc: Temporal  PainSc: 0-No pain         Complications: No notable events documented.

## 2022-04-13 NOTE — Discharge Instructions (Addendum)
Indwelling Urinary Catheter Care, Adult An indwelling urinary catheter is a thin tube that is put into your bladder. The tube helps to drain pee (urine) out of your body. The tube goes in through your urethra. Your urethra is where pee comes out of your body. Your pee will come out through the catheter, then it will go into a bag (drainage bag). Take good care of your catheter so it will work well. What are the risks? Germs may get into your bladder and cause an infection. The tube can become blocked. Tissue near the catheter may become irritated and may bleed. How to wear your catheter and drainage bag Supplies needed Sticky tape (adhesive tape) or a leg strap. Alcohol wipe or soap and water (if you use tape). A clean towel (if you use tape). Large overnight bag. Smaller bag (leg bag). Wearing your catheter Attach your catheter to your leg with tape or a leg strap. Make sure the catheter is not pulled tight. If a leg strap gets wet, take it off and put on a dry strap. If you use tape to hold the bag on your leg: Use an alcohol wipe or soap and water to wash your skin where the tape made it sticky before. Use a clean towel to pat-dry that skin. Use new tape to make the bag stay on your leg. Wearing your bags You should have been given a large overnight bag. You may wear the overnight bag in the day or night. Always have the overnight bag lower than your bladder.  Do not let the bag touch the floor. Before you go to sleep, put a clean plastic bag in a wastebasket. Then, hang the overnight bag inside the wastebasket. You should also have a smaller leg bag that fits under your clothes. Wear the leg bag as told by the product maker. This may be above or below the knee, depending on the length of the tubing. Make sure that the leg bag is below the bladder. Make sure that the tubing does not have loops or too much tension. Do not wear your leg bag at night. How to care for your skin and  catheter Supplies needed A clean washcloth. Water and mild soap. A clean towel. Caring for your skin and catheter Male anatomy showing the labia, urethra, and an indwelling urinary catheter in the bladder.     Male anatomy showing the penis, urethra, and an indwelling urinary catheter in the bladder.   Clean the skin around your catheter every day. Wash your hands with soap and water. Wet a clean washcloth in warm water and mild soap. Clean the skin around your urethra. If you are male: Gently spread the folds of skin around your vagina (labia). With the washcloth in your other hand, wipe the inner side of your labia on each side. Wipe from front to back. If you are male: Pull back any skin that covers the end of your penis (foreskin). With the washcloth in your other hand, wipe your penis in small circles. Start wiping at the tip of your penis, then move away from the catheter. Move the foreskin back in place, if needed. With your free hand, hold the catheter close to where it goes into your body. Keep holding the catheter during cleaning so it does not get pulled out. With the washcloth in your other hand, clean the catheter. Only wipe downward on the catheter, toward the drainage bag. Do not wipe upward toward your body. Doing this   may push germs into your urethra and cause infection. Use a clean towel to pat-dry the catheter and the skin around it. Make sure to wipe off all soap. Wash your hands with soap and water. Shower every day. Do not take baths. Do not use cream, ointment, or lotion on the area where the catheter goes into your body, unless your doctor tells you to. Do not use powders, sprays, or lotions on your genital area. Check your skin around the catheter every day for signs of infection. Check for: Redness, swelling, or pain. Fluid or blood. Warmth. Pus or a bad smell. How to empty the bag Supplies needed Rubbing alcohol. Gauze pad or cotton ball. Tape  or a leg strap. Emptying the bag Pour the pee out of your bag when it is ?- full, or at least 2-3 times a day. Do this for your overnight bag and your leg bag. Wash your hands with soap and water. Separate (detach) the bag from your leg. Hold the bag over the toilet or a clean pail. Keep the bag lower than your hips and bladder. This is so the pee (urine) does not go back into the tube. Open the pour spout. It is at the bottom of the bag. Empty the pee into the toilet or pail. Do not let the pour spout touch any surface. Put rubbing alcohol on a gauze pad or cotton ball. Use the gauze pad or cotton ball to clean the pour spout. Close the pour spout. Attach the bag to your leg with tape or a leg strap. Wash your hands with soap and water. Follow instructions for cleaning the drainage bag. Instructions can come from: The product maker. Your doctor. How to change the bag Changing the bag Replace your bag when it starts to leak, smell bad, or look dirty. Wash your hands with soap and water. Separate the dirty bag from your leg. Pinch the catheter with your fingers so that pee does not spill out. Separate the catheter tube from the bag tube where these tubes connect (at the connection valve). Do not let the tubes touch any surface. Clean the end of the catheter tube with an alcohol wipe. Use a different alcohol wipe to clean the end of the bag tube. Connect the catheter tube to the tube of the clean bag. Attach the clean bag to your leg with tape or a leg strap. Do not make the bag tight on your leg. Wash your hands with soap and water. General instructions A person washing hands with soap and water.   Never pull on your catheter. Never try to take it out. Doing that can hurt you. Always wash your hands before and after you touch your catheter or bag. Use a mild, fragrance-free soap. If you do not have soap and water, use hand sanitizer. Always make sure there are no twists, bends, or  kinks in the catheter tube. Always make sure there are no leaks in the catheter or bag. Drink enough fluid to keep your pee pale yellow. Do not take baths, swim, or use a hot tub. If you are male, wipe from front to back after you poop (have a bowel movement). Contact a doctor if: Your catheter gets clogged. Your catheter leaks. You have signs of infection at the catheter site, such as: Redness, swelling, or pain where the catheter goes into your body. Fluid, blood, pus, or a bad smell coming from the area where the catheter goes into your body. Skin feels   warm where the catheter goes into your body. You have signs of a bladder infection, such as: Fever. Chills. Pee smells worse than usual. Cloudy pee. Pain in your belly, legs, lower back, or bladder. Vomiting or feel like vomiting. Get help right away if: You see blood in the catheter. Your pee is pink or red. Your bladder feels full. Your pee is not draining into the bag. Your catheter gets pulled out. Summary An indwelling urinary catheter is a thin tube that is placed into the bladder to help drain pee (urine) out of the body. The catheter is placed into the part of the body that drains pee from the bladder (urethra). Taking good care of your catheter will keep it working well. Always wash your hands before and after touching your catheter or bag. Never pull on your catheter or try to take it out. This information is not intended to replace advice given to you by your health care provider. Make sure you discuss any questions you have with your health care provider. Document Revised: 07/14/2021 Document Reviewed: 07/14/2021 Elsevier Patient Education  2023 [employer]    AMBULATORY SURGERY  DISCHARGE INSTRUCTIONS   The drugs that you were given will stay in your system until tomorrow so for the next 24 hours you should not:  Drive an automobile Make any legal decisions Drink any alcoholic beverage   You may  resume regular meals tomorrow.  Today it is better to start with liquids and gradually work up to solid foods.  You may eat anything you prefer, but it is better to start with liquids, then soup and crackers, and gradually work up to solid foods.   Please notify your doctor immediately if you have any unusual bleeding, trouble breathing, redness and pain at the surgery site, drainage, fever, or pain not relieved by medication.    Additional Instructions:        Please contact your physician with any problems or Same Day Surgery at [phone], Monday through Friday 6 am to 4 pm, or [facility] number at [phone].  

## 2022-04-13 NOTE — Anesthesia Preprocedure Evaluation (Addendum)
Anesthesia Evaluation  Patient identified by MRN, date of birth, ID band Patient awake    Reviewed: Allergy & Precautions, H&P , NPO status , Patient's Chart, lab work & pertinent test results, reviewed documented beta blocker date and time   Airway Mallampati: III  TM Distance: >3 FB Neck ROM: full    Dental  (+) Teeth Intact   Pulmonary sleep apnea and Continuous Positive Airway Pressure Ventilation , former smoker,    Pulmonary exam normal        Cardiovascular Exercise Tolerance: Good hypertension, Pt. on medications Normal cardiovascular exam Rhythm:regular Rate:Normal     Neuro/Psych negative neurological ROS  negative psych ROS   GI/Hepatic Neg liver ROS, hiatal hernia, GERD  Controlled,  Endo/Other  negative endocrine ROS  Renal/GU negative Renal ROS  negative genitourinary   Musculoskeletal   Abdominal Normal abdominal exam  (+)   Peds  Hematology negative hematology ROS (+)   Anesthesia Other Findings Past Medical History: No date: Allergy No date: Closed compression fracture of L5 vertebra ([place]) 08/24/2021: COVID-19     Comment:  molnupiravir x bid x 5 days No date: DDD (degenerative disc disease), lumbar No date: Granulomatous lung disease ([place]) No date: Hemangioma No date: Hiatal hernia No date: Hypertension No date: Liver cyst No date: OSA on CPAP No date: Prediabetes     Comment:  6.1 10/23/18 Past Surgical History: 09/18/2019: AXILLARY LYMPH NODE BIOPSY; Right     Comment:  Procedure: AXILLARY LYMPH NODE BIOPSY;  Surgeon: Jules Husbands, MD;  Location: [facility] ORS;  Service: General;                Laterality: Right; No date: CARDIAC CATHETERIZATION No date: HERNIA REPAIR     Comment:  15 years ago-right inguinal  No date: left cataract surgery; Left     Comment:  fall 2022 BMI    Body Mass Index: 21.52 kg/m     Reproductive/Obstetrics negative OB ROS                             Anesthesia Physical  Anesthesia Plan  ASA: 2  Anesthesia Plan: General   Post-op Pain Management: Ofirmev IV (intra-op)* and Minimal or no pain anticipated   Induction: Intravenous  PONV Risk Score and Plan: 3  Airway Management Planned: Oral ETT  Additional Equipment:   Intra-op Plan:   Post-operative Plan: Extubation in OR  Informed Consent: I have reviewed the patients History and Physical, chart, labs and discussed the procedure including the risks, benefits and alternatives for the proposed anesthesia with the patient or authorized representative who has indicated his/her understanding and acceptance.     Dental Advisory Given  Plan Discussed with: CRNA and Anesthesiologist  Anesthesia Plan Comments:       Anesthesia Quick Evaluation

## 2022-04-13 NOTE — Op Note (Signed)
Preoperative diagnosis: Prostate cancer (C61)  Postoperative diagnosis: Same  Procedure: High intensity focused ultrasound (HIFU)  (Q9476)  Surgeon: Otelia Limes. Yves Dill MD  Anesthesia: General  Indications:See the history and physical also. 70 year old (David Church: 12-01-1950)African-American male with an elevated PSA of 6.61 ng/mL.  He was evaluated with fusion MRI scan biopsy using UroNav on 03/30th.  MRI scan indicated that he had a prostate size of 26 mL with a  PIRAD category 5 lesion of the right peripheral zone.  Pathology report indicated Gleason's grade 4+3 adenocarcinoma involving the right lateral apex and right lateral base.  He had Gleason's grade 3+3 adenocarcinoma involving the region of interest.  He  had initially preferred active surveillance, but changed his mind and now comes in for HIFU treatment. After informed consent the above procedure(s) were requested     Technique and findings:The patient, identified by his name bracelet, was taken to the procedure room. After a standard timeout was performed,  the staff that were present agreed on the procedure to be performed. The patient was anesthetized, prepped and draped in  the usual manner. A urethral catheter was placed and the catheter connected to closed system gravity drainage. The patient was placed in the lithotomy position  with padded stirrups and anti-embolism compression devices applied. The trans-rectal Sonablate probe was carefully  inserted and the prostate was measured with the dimensions noted below. The dimensions of the prostate were:  H _2.5_____ cm W ___4.52___ cm L _3.36__ cm, for a calculated volume of __19.73__cc.  Images in both transverse and sagittal views were obtained. The urethral catheter was / was not removed. A treatment plan was programmed.   The power levels were selected based on approximate rectal wall distance as outlined in the Physician Instruction Manual. The temperature of the rectal  probe and the reflectivity index were maintained within normal range at all times. The targeted area of the prostate gland was treated in its entirety.  One drop zone was treated.  The procedure was recorded on the [employer] The patient was transferred to the recovery room in stable condition.

## 2022-04-14 NOTE — Anesthesia Postprocedure Evaluation (Signed)
Anesthesia Post Note  Patient: David Church  Procedure(s) Performed: HIGH INTENSITY FOCUSED ULTRASOUND (HIFU) OF THE PROSTATE  Patient location during evaluation: PACU Anesthesia Type: General Level of consciousness: combative Pain management: pain level controlled Vital Signs Assessment: post-procedure vital signs reviewed and stable Respiratory status: spontaneous breathing, nonlabored ventilation and respiratory function stable Cardiovascular status: blood pressure returned to baseline and stable Postop Assessment: no apparent nausea or vomiting Anesthetic complications: no   No notable events documented.   Last Vitals:  Vitals:   04/13/22 1000 04/13/22 1024  BP: 124/78 124/72  Pulse: 63 61  Resp: 14 16  Temp:  (!) 36.2 C  SpO2: 96% 96%    Last Pain:  Vitals:   04/13/22 1024  TempSrc: Temporal  PainSc: [name]

## 2022-04-28 ENCOUNTER — Encounter: Payer: Self-pay | Admitting: Internal Medicine

## 2022-04-28 ENCOUNTER — Ambulatory Visit (INDEPENDENT_AMBULATORY_CARE_PROVIDER_SITE_OTHER): Payer: Medicare HMO | Admitting: Internal Medicine

## 2022-04-28 VITALS — BP 140/80 | HR 59 | Temp 98.1°F | Resp 14 | Ht 69.0 in | Wt 145.4 lb

## 2022-04-28 DIAGNOSIS — R7989 Other specified abnormal findings of blood chemistry: Secondary | ICD-10-CM | POA: Diagnosis not present

## 2022-04-28 DIAGNOSIS — Z0000 Encounter for general adult medical examination without abnormal findings: Secondary | ICD-10-CM | POA: Diagnosis not present

## 2022-04-28 DIAGNOSIS — C61 Malignant neoplasm of prostate: Secondary | ICD-10-CM | POA: Diagnosis not present

## 2022-04-28 DIAGNOSIS — D72819 Decreased white blood cell count, unspecified: Secondary | ICD-10-CM | POA: Diagnosis not present

## 2022-04-28 DIAGNOSIS — I10 Essential (primary) hypertension: Secondary | ICD-10-CM | POA: Diagnosis not present

## 2022-04-28 DIAGNOSIS — E785 Hyperlipidemia, unspecified: Secondary | ICD-10-CM

## 2022-04-28 DIAGNOSIS — R739 Hyperglycemia, unspecified: Secondary | ICD-10-CM | POA: Diagnosis not present

## 2022-04-28 DIAGNOSIS — R7303 Prediabetes: Secondary | ICD-10-CM

## 2022-04-28 DIAGNOSIS — E550 Rickets, active: Secondary | ICD-10-CM | POA: Diagnosis not present

## 2022-04-28 MED ORDER — FLUTICASONE PROPIONATE 50 MCG/ACT NA SUSP: 1.0000 | Freq: Every day | NASAL | 11 refills | Status: DC | PRN

## 2022-04-28 MED ORDER — AMLODIPINE BESYLATE 5 MG PO TABS: 5.0000 mg | ORAL_TABLET | Freq: Every morning | ORAL | 3 refills | Status: DC

## 2022-04-28 NOTE — Patient Instructions (Addendum)
Please get basic urinalysis and culture with Dr. Yves Dill next week Water 55-64 ounces daily

## 2022-04-28 NOTE — Progress Notes (Signed)
Chief Complaint  Patient presents with   Follow-up    Yearly, denies any concerns or pain   Annual with wife  1. Elevated PSA +adenoca prostate cancer seeing urology Dr. Lucinda Dell [place] and did HIFU high intensity focus ultrasound of the prostate doing well only complaint is burning with urination and he mentions he had foley with procedure 04/13/22 but Dr. Yves Dill states this is normal no need to do urine culture   2. Htn bp 120-130s/70s-80s home readings pt bp slightly elevated at MD visit could be white coat htn  On norvasc 5 mg qd    Review of Systems  Constitutional:  Negative for weight loss.  HENT:  Negative for hearing loss.   Eyes:  Negative for blurred vision.  Respiratory:  Negative for shortness of breath.   Cardiovascular:  Negative for chest pain.  Gastrointestinal:  Negative for abdominal pain and blood in stool.  Genitourinary:  Positive for dysuria.  Musculoskeletal:  Negative for back pain.  Skin:  Negative for rash.  Neurological:  Negative for headaches.  Psychiatric/Behavioral:  Negative for depression.    Past Medical History:  Diagnosis Date   Allergy    Closed compression fracture of L5 vertebra ([place])    COVID-19 08/24/2021   molnupiravir x bid x 5 days   DDD (degenerative disc disease), lumbar    Granulomatous lung disease (HCC)    Hemangioma    Hiatal hernia    Hypertension    Liver cyst    OSA on CPAP    Prediabetes    6.1 10/23/18   Prostate cancer [facility])    Past Surgical History:  Procedure Laterality Date   AXILLARY LYMPH NODE BIOPSY Right 09/18/2019   Procedure: AXILLARY LYMPH NODE BIOPSY;  Surgeon: Jules Husbands, MD;  Location: [facility] ORS;  Service: General;  Laterality: Right;   CARDIAC CATHETERIZATION     HERNIA REPAIR     15 years ago-right inguinal    HIGH INTENSITY FOCUSED ULTRASOUND (HIFU) OF THE PROSTATE N/A 04/13/2022   Procedure: HIGH INTENSITY FOCUSED ULTRASOUND (HIFU) OF THE PROSTATE;  Surgeon: Royston Cowper, MD;  Location:  [facility] ORS;  Service: Urology;  Laterality: N/A;   left cataract surgery Left    fall 2022   PROSTATE BIOPSY N/A 02/23/2022   Procedure: PROSTATE BIOPSY Vernelle Emerald;  Surgeon: Royston Cowper, MD;  Location: [facility] ORS;  Service: Urology;  Laterality: N/A;   Family History  Problem Relation Age of Onset   Cancer Mother 17   Diabetes Mother    Hypertension Mother    Alzheimer's disease Father    Heart attack Father    Stomach cancer Maternal Aunt    Lung cancer Maternal Uncle    Social History   Socioeconomic History   Marital status: Married    Spouse name: Not on file   Number of children: Not on file   Years of education: Not on file   Highest education level: Not on file  Occupational History   Not on file  Tobacco Use   Smoking status: Former    Packs/day: 0.10    Years: 49.00    Total pack years: 4.90    Types: Cigarettes    Quit date: 11/2020    Years since quitting: 1.4   Smokeless tobacco: Never  Vaping Use   Vaping Use: Never used  Substance and Sexual Activity   Alcohol use: Yes    Alcohol/week: 2.0 standard drinks of alcohol    Types: 2 Cans  of beer per week    Comment: 2 beers a day and wine on special occasion   Drug use: Not Currently    Types: Marijuana, Cocaine    Comment: did drugs but quit in early 30's   Sexual activity: Yes  Other Topics Concern   Not on file  Social History Narrative   Retired    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[employer]    04/2019 moved from [place]    2 kids son 69, daughter 39 as of 01/13/20 daughter in [place]    2 grandsons    Likes to cruise    Social Determinants of Health   Financial Resource Strain: [place]  (03/30/2022)   Overall Financial Resource Strain (CARDIA)    Difficulty of Paying Living Expenses: Not hard at all  Food Insecurity: No Food Insecurity (03/30/2022)   Hunger Vital Sign    Worried About Running Out of Food in the Last Year: Never true    [place] in the Last Year: Never true   Transportation Needs: No Transportation Needs (03/30/2022)   PRAPARE - Hydrologist (Medical): No    Lack of Transportation (Non-Medical): No  Physical Activity: Sufficiently Active (03/30/2022)   Exercise Vital Sign    Days of Exercise per Week: 4 days    Minutes of Exercise per Session: 60 min  Stress: No Stress Concern Present (03/30/2022)   [place]    Feeling of Stress : Not at all  Social Connections: Unknown (03/30/2022)   Social Connection and Isolation Panel [NHANES]    Frequency of Communication with Friends and Family: Not on file    Frequency of Social Gatherings with Friends and Family: Not on file    Attends Religious Services: Not on file    Active Member of Clubs or Organizations: Not on file    Attends Archivist Meetings: Not on file    Marital Status: Married  Intimate Partner Violence: Not At Risk (03/30/2022)   Humiliation, Afraid, Rape, and Kick questionnaire    Fear of Current or Ex-Partner: No    Emotionally Abused: No    Physically Abused: No    Sexually Abused: No   Current Meds  Medication Sig   Cholecalciferol 1.25 MG (50000 UT) capsule Take 1 capsule (50,000 Units total) by mouth once a week. d3 (Patient taking differently: Take 50,000 Units by mouth every Sunday. d3)   docusate sodium (COLACE) 100 MG capsule Take 2 capsules (200 mg total) by mouth 2 (two) times daily.   Hyoscyamine Sulfate SL (LEVSIN/SL) 0.125 MG SUBL Place 0.125 mg under the tongue every 4 (four) hours as needed (bladder). 1-2 TABS   Multiple Vitamin (MULTIVITAMIN) tablet Take 1 tablet by mouth daily. One A Day   tadalafil (CIALIS) 5 MG tablet Take 5 mg by mouth every evening.   tamsulosin (FLOMAX) 0.4 MG CAPS capsule Take 1 capsule (0.4 mg total) by mouth daily.   [DISCONTINUED] amLODipine (NORVASC) 5 MG tablet Take 1 tablet (5 mg total) by mouth daily. (Patient taking differently: Take 5  mg by mouth in the morning.)   [DISCONTINUED] fluticasone (FLONASE) 50 MCG/ACT nasal spray Place 1 spray into both nostrils as needed.   [DISCONTINUED] sulfamethoxazole-trimethoprim (BACTRIM DS) 800-160 MG tablet Take 1 tablet by mouth 2 (two) times daily.   No Known Allergies Recent Results (from the past 2160 hour(s))  Surgical pathology     Status: None   Collection Time: 02/23/22  7:52 AM  Result Value Ref Range   SURGICAL PATHOLOGY      Surgical Pathology CASE: [id number] PATIENT: David Church Surgical Pathology Report     Specimen Submitted: A. PROSTATE, LEFT BASE B. PROSTATE, LEFT MID C. PROSTATE, LEFT APEX D. PROSTATE, RIGHT BASE E. PROSTATE, RIGHT MID F. PROSTATE, RIGHT APEX G. PROSTATE, LEFT LATERAL BASE H. PROSTATE, LEFT LATERAL MID I. PROSTATE, LEFT LATERAL APEX J. PROSTATE, RIGHT LATERAL BASE K. PROSTATE, RIGHT LATERAL MID L. PROSTATE, RIGHT LATERAL APEX M. ROI # 1  Clinical History: Elevated PSA      DIAGNOSIS: Diagnostic Map   Benign  Atypical  Malignant  HGPIN     Diagnostic Summary  [A] PROSTATE, LEFT BASE:   NEGATIVE FOR MALIGNANCY.  [B] PROSTATE, LEFT MID:   NEGATIVE FOR MALIGNANCY.  [C] PROSTATE, LEFT APEX:   NEGATIVE FOR MALIGNANCY.  [D] PROSTATE, RIGHT BASE:   NEGATIVE FOR MALIGNANCY.  [E] PROSTATE, RIGHT MID:   NEGATIVE FOR MALIGNANCY.  [F] PROSTATE, RIGHT APEX:   NEGATIVE FOR MALIGNANCY. FIBROMUSCULAR STROMA ONLY.  [G] PROSTATE, LEFT LATERAL BASE:   NE GATIVE FOR MALIGNANCY.  [H] PROSTATE, LEFT LATERAL MID:   NEGATIVE FOR MALIGNANCY.  [I] PROSTATE, LEFT LATERAL APEX:   NEGATIVE FOR MALIGNANCY.  [J] PROSTATE, RIGHT LATERAL BASE:   ACINAR ADENOCARCINOMA, GLEASON 4+3=7  (GG 3), INVOLVING 1/1 CORES, MEASURING 6  MM ( 86%).  [K] PROSTATE, RIGHT LATERAL MID:   ACINAR ADENOCARCINOMA, GLEASON 3+3=6 (GG 1), INVOLVING 1/1 CORES, MEASURING 1  MM ( 10%).  [L] PROSTATE, RIGHT LATERAL APEX:   ACINAR ADENOCARCINOMA, GLEASON 4+3=7  (GG 3),  INVOLVING 1/1 CORES, MEASURING 7  MM ( 88%).  [M] ROI # 1:   ACINAR ADENOCARCINOMA, GLEASON 3+3=6  (GG 1), INVOLVING 1/4 CORES, MEASURING 6  MM ( 18%). TUMOR INVOLVES 67% OF CORE LENGTH. PERINEURAL INVASION PRESENT.     GROSS DESCRIPTION: A. Labeled: Left base Received: Formalin Collection time: 7:52 AM on 02/23/2022 Placed into formalin time: 7:52 AM on 02/23/2022 Number of needle core biopsy(s): 1 Length: 1 cm Diameter: 0.1 cm Description: Needle core biopsy fragment of tan soft tissue Ink : Blue Entirely submitted in 1 cassette.  B. Labeled: Left mid Received: Formalin Collection time: 7:52 AM on 02/23/2022 Placed into formalin time: 7:52 AM on 02/23/2022 Number of needle core biopsy(s): 1 Length: 1.2 cm Diameter: 0.1 cm Description: Needle core biopsy fragment of tan soft tissue Ink: Blue Entirely submitted in 1 cassette.  C. Labeled: Left apex Received: Formalin Collection time: 7:52 AM on 02/23/2022 Placed into formalin time: 7:52 AM on 02/23/2022 Number of needle core biopsy(s): 1 Length: 1.7 cm Diameter: 0.1 cm Description: Needle core biopsy fragment of tan soft tissue Ink: Blue Entirely submitted in 1 cassette.  D. Labeled: Right base Received: Formalin Collection time: 7:52 AM on 02/23/2022 Placed into formalin time: 7:52 AM on 02/23/2022 Number of needle core biopsy(s): 1 Length: 1.2 cm Diameter: 0.1 cm Description: Needle core biopsy fragment of tan soft tissue Ink: Blue Entirely submitted in 1 cassette.  E. Labeled: Right mid Re ceived: Formalin Collection time: 7:52 AM on 02/23/2022 Placed into formalin time: 7:52 AM on 02/23/2022 Number of needle core biopsy(s): 1 Length: 1.3 cm Diameter: 0.1 cm Description: Needle core biopsy fragment of tan soft tissue Ink: Blue Entirely submitted in 1 cassette.  F. Labeled: Right apex Received: Formalin Collection time: 7:52 AM on 02/23/2022 Placed into  formalin time: 7:52 AM on 02/23/2022 Number of needle  core biopsy(s): 3 Length: Range from 0.1-1.2 cm Diameter: 0.1 cm Description: Needle core biopsy fragments of tan soft tissue Ink: Blue Entirely submitted in 1 cassette.  G. Labeled: Left lateral base Received: Formalin Collection time: 7:52 AM on 02/23/2022 Placed into formalin time: 7:52 AM on 02/23/2022 Number of needle core biopsy(s): 1 Length: 1.2 cm Diameter: 0.1 cm Description: Needle core biopsy fragment of tan soft tissue Ink: Blue Entirely submitted in 1 cassette.  H. Labeled: Left lateral mid Received: Formalin Collection time: 7:5 2 AM on 02/23/2022 Placed into formalin time: 7:52 AM on 02/23/2022 Number of needle core biopsy(s): 2 Length: Range from 0.4-0.8 cm Diameter: 0.1 cm Description: Needle core biopsy fragments of tan soft tissue Ink: Blue Entirely submitted in 1 cassette.  I. Labeled: Left lateral apex Received: Formalin Collection time: 7:52 AM on 02/23/2022 Placed into formalin time: 7:52 AM on 02/23/2022 Number of needle core biopsy(s): 1 Length: 1.1 cm Diameter: 0.1 cm Description: Needle core biopsy fragment of tan soft tissue Ink: Blue Entirely submitted in 1 cassette.  J. Labeled: Right lateral base Received: Formalin Collection time: 7:52 AM on 02/23/2022 Placed into formalin time: 7:52 AM on 02/23/2022 Number of needle core biopsy(s): 2 Length: Range from 0.3-1.1 cm Diameter: 0.1 cm Description: Needle core biopsy fragments of tan soft tissue Ink: Blue Entirely submitted in 1 cassette.  K. Labeled: Right lateral mid Received: Formalin Collection time: 7:52 AM on 3/30/ 2023 Placed into formalin time: 7:52 AM on 02/23/2022 Number of needle core biopsy(s): 1 Length: 1.2 cm Diameter: 0.1 cm Description: Needle core biopsy fragment of tan soft tissue Ink: Blue Entirely submitted in 1 cassette.  L. Labeled: Right lateral apex Received: Formalin Collection time: 7:52 AM on 02/23/2022 Placed into formalin time: 7:52 AM on 02/23/2022 Number  of needle core biopsy(s): 2 Length: Range from 0.4-1.1 cm Diameter: 0.1 cm Description: Needle core biopsy fragments of tan soft tissue Ink: Blue Entirely submitted in 1 cassette.  M. Labeled: ROI #1 (per requisition right posterior apex) Received: Formalin Collection time: 7:52 AM on 02/23/2022 Placed into formalin time: 7:52 AM on 02/23/2022 Number of needle core biopsy(s): 5 Length: Range from 0.2-1.7 cm Diameter: 0.1 cm Description: Needle core biopsy fragments of tan soft tissue Ink: Blue Entirely submitted in 1 cassette.  RB 02/23/2022    Final Diagnosis performed by Quay Burow, MD.    Electronically signed 02/24/2022 12:42:43PM The electronic signature indicates that the named Attending Pathologist has evaluated the specimen Technical component performed at [facility], [address] [place], [place] [address] Lab: [phone] Dir: Rush Farmer, MD, MMM  Professional component performed at [facility], [facility], [place], [place], [place] [address] Lab: [phone] Dir: Kathi Simpers, MD   CBC     Status: Abnormal   Collection Time: 04/07/22 11:30 AM  Result Value Ref Range   WBC 3.5 (L) 4.0 - 10.5 K/uL   RBC 5.44 4.22 - 5.81 MIL/uL   Hemoglobin 16.2 13.0 - 17.0 g/dL   HCT 48.6 39.0 - 52.0 %   MCV 89.3 80.0 - 100.0 fL   MCH 29.8 26.0 - 34.0 pg   MCHC 33.3 30.0 - 36.0 g/dL   RDW 12.5 11.5 - 15.5 %   Platelets 199 150 - 400 K/uL   nRBC 0.0 0.0 - 0.2 %    Comment: Performed at [facility], [address] [place], [place] [address]  Comprehensive metabolic panel  Status: Abnormal   Collection Time: 04/07/22 11:30 AM  Result Value Ref Range   Sodium 140 135 - 145 mmol/L   Potassium 4.2 3.5 - 5.1 mmol/L   Chloride 106 98 - 111 mmol/L   CO2 28 22 - 32 mmol/L   Glucose, Bld 109 (H) 70 - 99 mg/dL    Comment: Glucose reference range applies only to samples taken after fasting for at least 8 hours.   BUN 11 8 - 23 mg/dL    Creatinine, Ser 1.25 (H) 0.61 - 1.24 mg/dL   Calcium 9.0 8.9 - 10.3 mg/dL   Total Protein 7.8 6.5 - 8.1 g/dL   Albumin 4.1 3.5 - 5.0 g/dL   AST 22 15 - 41 U/L   ALT 16 0 - 44 U/L   Alkaline Phosphatase 53 38 - 126 U/L   Total Bilirubin 0.5 0.3 - 1.2 mg/dL   GFR, Estimated >60 >60 mL/min    Comment: (NOTE) Calculated using the CKD-EPI Creatinine Equation (2021)    Anion gap 6 5 - 15    Comment: Performed at [facility], [address] [place], [place] [address]   Objective  Body mass index is 21.47 kg/m. Wt Readings from Last 3 Encounters:  04/28/22 145 lb 6.4 oz (66 kg)  04/13/22 143 lb 4.8 oz (65 kg)  04/05/22 143 lb 4.8 oz (65 kg)   Temp Readings from Last 3 Encounters:  04/28/22 98.1 F (36.7 C) (Oral)  04/13/22 (!) 97.2 F (36.2 C) (Temporal)  02/23/22 (!) 97 F (36.1 C) (Temporal)   BP Readings from Last 3 Encounters:  04/28/22 140/80  04/13/22 124/72  02/23/22 123/73   Pulse Readings from Last 3 Encounters:  04/28/22 (!) 59  04/13/22 61  02/23/22 66    Physical Exam Vitals and nursing note reviewed.  Constitutional:      Appearance: Normal appearance. He is well-developed and well-groomed.  HENT:     Head: Normocephalic and atraumatic.  Eyes:     Conjunctiva/sclera: Conjunctivae normal.     Pupils: Pupils are equal, round, and reactive to light.  Cardiovascular:     Rate and Rhythm: Normal rate and regular rhythm.     Heart sounds: Normal heart sounds.  Pulmonary:     Effort: Pulmonary effort is normal. No respiratory distress.     Breath sounds: Normal breath sounds.  Abdominal:     Tenderness: There is no abdominal tenderness.  Skin:    General: Skin is warm and moist.  Neurological:     General: No focal deficit present.     Mental Status: He is alert and oriented to person, place, and time. Mental status is at baseline.     Sensory: Sensation is intact.     Motor: Motor function is intact.     Coordination: Coordination is  intact.     Gait: Gait is intact. Gait normal.  Psychiatric:        Attention and Perception: Attention and perception normal.        Mood and Affect: Mood and affect normal.        Speech: Speech normal.        Behavior: Behavior normal. Behavior is cooperative.        Thought Content: Thought content normal.        Cognition and Memory: Cognition and memory normal.        Judgment: Judgment normal.     Assessment  Plan  Annual physical exam  Hypertension, unspecified  type - Plan: amLODipine (NORVASC) 5 MG tablet  Prostate cancer ([place])  Hyperglycemia  Prediabetes - Plan: Hemoglobin A1c  Elevated serum creatinine - Plan: Basic Metabolic Panel (BMET)  Leukopenia, unspecified type - Plan: Pathologist smear review, CBC with Differential/Platelet  Hyperlipidemia, unspecified hyperlipidemia type - Plan: Lipid panel  Vitamin D deficient rickets - Plan: Vitamin D (25 hydroxy)  HM Declines all vaccines flu, Tdap both made arm swell, shingrix, prevnar, pna 23   5/5 covid 19 vx had had covid 08/24/21   psa normal 1.11 06/2019 recheck 11/17/21 6.61 +adenoca prostate cancer tx'ed HIFU 04/13/22 Dr. Yves Dill urology [facility] [place]   Colonoscopy [facility] [place] Dr. Abundio Miu 09/10/17 diverticulosis repeat in 10 years    rec healthy diet and exercise    Hep C 11/17/15    MRI ab w w/o contrast 2.8 x 3.0 cm left lobe liver 1.0 liver cyst nonspecific perioportal edema, 0.6 x 1.0 cm right renal cyst midpole, L1/L3 vertebral body hemangiomas present 1.3 x 1.7 cm right ant chest wall cyst.  [place] 02/28/16 left liver cyst 3.6 cm solid mass  01/03/17 CXR normal  11/10/15 calcified granulomas liver and spleen prior granulomatous disease. Small hiatal hernia     Provider: Dr. Olivia Mackie McLean-Scocuzza-Internal Medicine

## 2022-05-03 DIAGNOSIS — R3914 Feeling of incomplete bladder emptying: Secondary | ICD-10-CM | POA: Diagnosis not present

## 2022-05-03 DIAGNOSIS — C61 Malignant neoplasm of prostate: Secondary | ICD-10-CM | POA: Diagnosis not present

## 2022-05-03 DIAGNOSIS — D400 Neoplasm of uncertain behavior of prostate: Secondary | ICD-10-CM | POA: Diagnosis not present

## 2022-05-10 DIAGNOSIS — E550 Rickets, active: Secondary | ICD-10-CM | POA: Insufficient documentation

## 2022-05-11 ENCOUNTER — Other Ambulatory Visit: Payer: Self-pay | Admitting: Internal Medicine

## 2022-05-11 DIAGNOSIS — E559 Vitamin D deficiency, unspecified: Secondary | ICD-10-CM

## 2022-05-18 IMAGING — MR MR PROSTATE WO/W CM
56 series · 56 of 56 positions shown · IV contrast (gadavist)
Comparison: None.

CLINICAL DATA: Elevated PSA level of 6.61 on 11/17/2021

EXAM:
MR PROSTATE WITHOUT AND WITH CONTRAST
TECHNIQUE: Multiplanar multisequence MRI images were obtained of the pelvis
centered about the prostate. Pre and post contrast images were
obtained.
CONTRAST:  6mL GADAVIST GADOBUTROL 1 MMOL/ML IV SOLN

[Series 3: ax in&out whole · axial · 3.0mm · 1.19mm/px · 1 of 88 slices shown (1 of 2)]
[im 1/88]
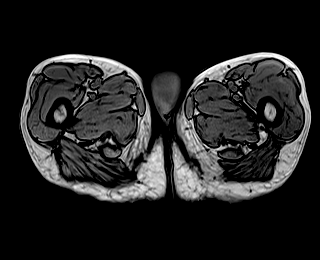

[Series 4: ax in&out whole · axial · 3.0mm · 1.19mm/px · 1 of 88 slices shown (2 of 2)]
[im 1/88]
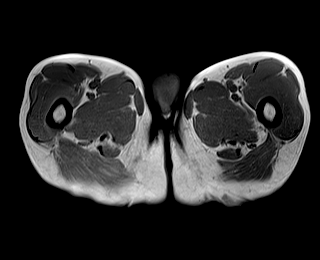

[Series 5: T2 · coronal · 3.0mm · 0.70mm/px · 1 of 35 slices shown (1 of 3)]
[im 1/35]
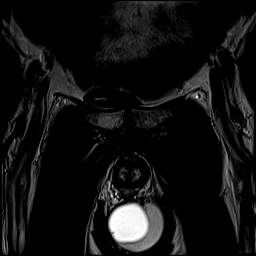

[Series 6: T2 · axial · 3.0mm · 0.56mm/px · 1 of 25 slices shown (2 of 3)]
[im 1/25]
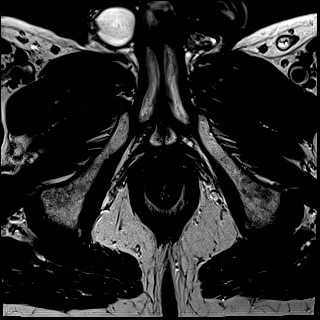

[Series 7: DWI · axial · 3.0mm · 0.86mm/px · 1 of 75 slices shown (1 of 3)]
[im 1/75]
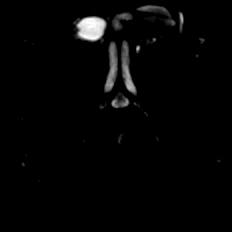

[Series 8: DWI · axial · 3.0mm · 0.86mm/px · 1 of 25 slices shown (2 of 3)]
[im 1/25]
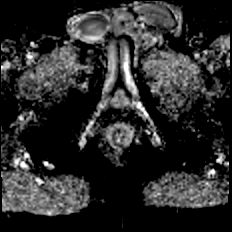

[Series 9: DWI · axial · 3.0mm · 0.86mm/px · 1 of 25 slices shown (3 of 3)]
[im 1/25]
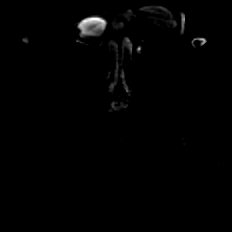

[Series 10: T2 · axial · 1.0mm · 1.04mm/px · 1 of 72 slices shown (3 of 3)]
[im 1/72]
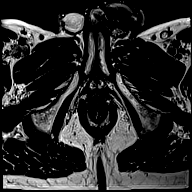

[Series 11: T1 · axial · 3.0mm · 1.15mm/px · 1 of 28 slices shown (1 of 48)]
[im 1/28]
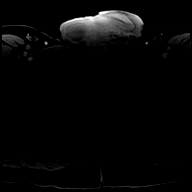

[Series 12: T1 · axial · 3.0mm · 1.15mm/px · 1 of 28 slices shown (2 of 48)]
[im 1/28]
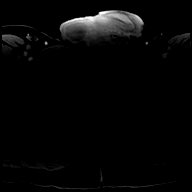

[Series 13: T1 · axial · 3.0mm · 1.15mm/px · 1 of 28 slices shown (3 of 48)]
[im 1/28]
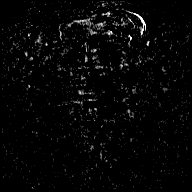

[Series 14: T1 · axial · 3.0mm · 1.15mm/px · 1 of 28 slices shown (4 of 48)]
[im 1/28]
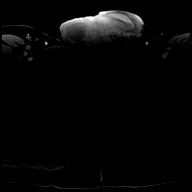

[Series 15: T1 · axial · 3.0mm · 1.15mm/px · 1 of 28 slices shown (5 of 48)]
[im 1/28]
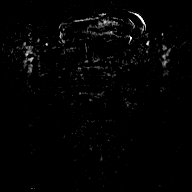

[Series 16: T1 · axial · 3.0mm · 1.15mm/px · 1 of 28 slices shown (6 of 48)]
[im 1/28]
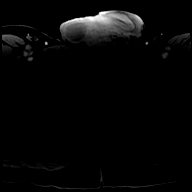

[Series 17: T1 · axial · 3.0mm · 1.15mm/px · 1 of 28 slices shown (7 of 48)]
[im 1/28]
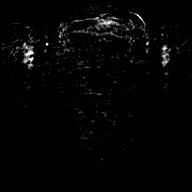

[Series 18: T1 · axial · 3.0mm · 1.15mm/px · 1 of 28 slices shown (8 of 48)]
[im 1/28]
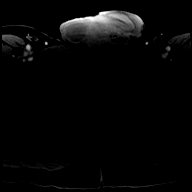

[Series 19: T1 · axial · 3.0mm · 1.15mm/px · 1 of 28 slices shown (9 of 48)]
[im 1/28]
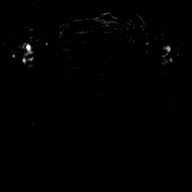

[Series 20: T1 · axial · 3.0mm · 1.15mm/px · 1 of 28 slices shown (10 of 48)]
[im 1/28]
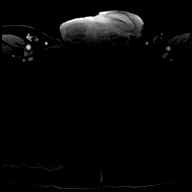

[Series 21: T1 · axial · 3.0mm · 1.15mm/px · 1 of 28 slices shown (11 of 48)]
[im 1/28]
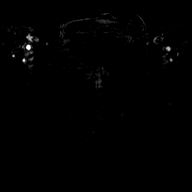

[Series 22: T1 · axial · 3.0mm · 1.15mm/px · 1 of 28 slices shown (12 of 48)]
[im 1/28]
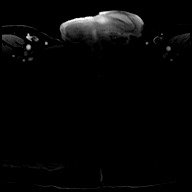

[Series 23: T1 · axial · 3.0mm · 1.15mm/px · 1 of 28 slices shown (13 of 48)]
[im 1/28]
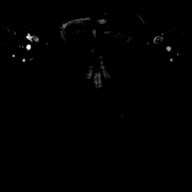

[Series 24: T1 · axial · 3.0mm · 1.15mm/px · 1 of 28 slices shown (14 of 48)]
[im 1/28]
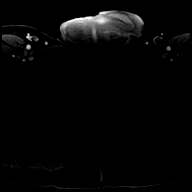

[Series 25: T1 · axial · 3.0mm · 1.15mm/px · 1 of 28 slices shown (15 of 48)]
[im 1/28]
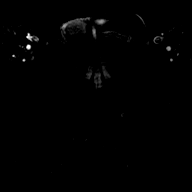

[Series 26: T1 · axial · 3.0mm · 1.15mm/px · 1 of 28 slices shown (16 of 48)]
[im 1/28]
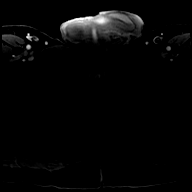

[Series 27: T1 · axial · 3.0mm · 1.15mm/px · 1 of 28 slices shown (17 of 48)]
[im 1/28]
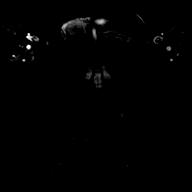

[Series 28: T1 · axial · 3.0mm · 1.15mm/px · 1 of 28 slices shown (18 of 48)]
[im 1/28]
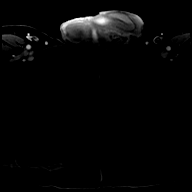

[Series 29: T1 · axial · 3.0mm · 1.15mm/px · 1 of 28 slices shown (19 of 48)]
[im 1/28]
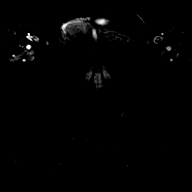

[Series 30: T1 · axial · 3.0mm · 1.15mm/px · 1 of 28 slices shown (20 of 48)]
[im 1/28]
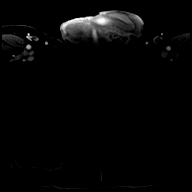

[Series 31: T1 · axial · 3.0mm · 1.15mm/px · 1 of 28 slices shown (21 of 48)]
[im 1/28]
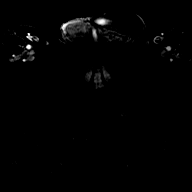

[Series 32: T1 · axial · 3.0mm · 1.15mm/px · 1 of 28 slices shown (22 of 48)]
[im 1/28]
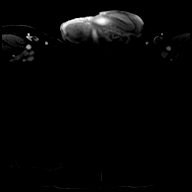

[Series 33: T1 · axial · 3.0mm · 1.15mm/px · 1 of 28 slices shown (23 of 48)]
[im 1/28]
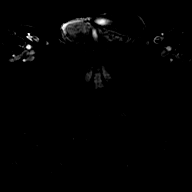

[Series 34: T1 · axial · 3.0mm · 1.15mm/px · 1 of 28 slices shown (24 of 48)]
[im 1/28]
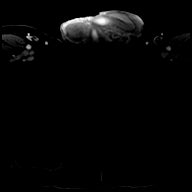

[Series 35: T1 · axial · 3.0mm · 1.15mm/px · 1 of 28 slices shown (25 of 48)]
[im 1/28]
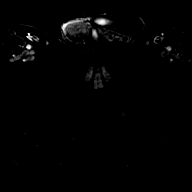

[Series 36: T1 · axial · 3.0mm · 1.15mm/px · 1 of 28 slices shown (26 of 48)]
[im 1/28]
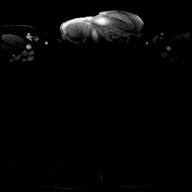

[Series 37: T1 · axial · 3.0mm · 1.15mm/px · 1 of 28 slices shown (27 of 48)]
[im 1/28]
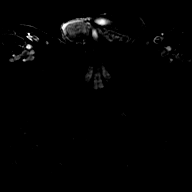

[Series 38: T1 · axial · 3.0mm · 1.15mm/px · 1 of 28 slices shown (28 of 48)]
[im 1/28]
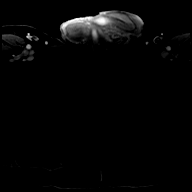

[Series 39: T1 · axial · 3.0mm · 1.15mm/px · 1 of 28 slices shown (29 of 48)]
[im 1/28]
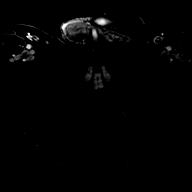

[Series 40: T1 · axial · 3.0mm · 1.15mm/px · 1 of 28 slices shown (30 of 48)]
[im 1/28]
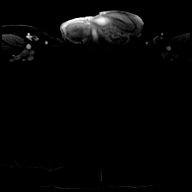

[Series 41: T1 · axial · 3.0mm · 1.15mm/px · 1 of 28 slices shown (31 of 48)]
[im 1/28]
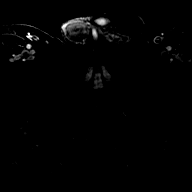

[Series 42: T1 · axial · 3.0mm · 1.15mm/px · 1 of 28 slices shown (32 of 48)]
[im 1/28]
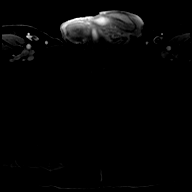

[Series 43: T1 · axial · 3.0mm · 1.15mm/px · 1 of 28 slices shown (33 of 48)]
[im 1/28]
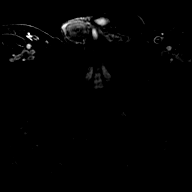

[Series 44: T1 · axial · 3.0mm · 1.15mm/px · 1 of 28 slices shown (34 of 48)]
[im 1/28]
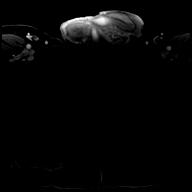

[Series 45: T1 · axial · 3.0mm · 1.15mm/px · 1 of 28 slices shown (35 of 48)]
[im 1/28]
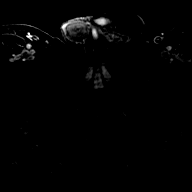

[Series 46: T1 · axial · 3.0mm · 1.15mm/px · 1 of 28 slices shown (36 of 48)]
[im 1/28]
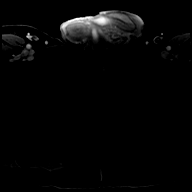

[Series 47: T1 · axial · 3.0mm · 1.15mm/px · 1 of 28 slices shown (37 of 48)]
[im 1/28]
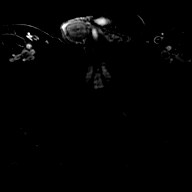

[Series 48: T1 · axial · 3.0mm · 1.15mm/px · 1 of 28 slices shown (38 of 48)]
[im 1/28]
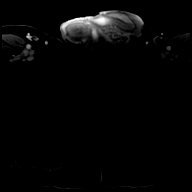

[Series 49: T1 · axial · 3.0mm · 1.15mm/px · 1 of 28 slices shown (39 of 48)]
[im 1/28]
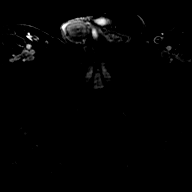

[Series 50: T1 · axial · 3.0mm · 1.15mm/px · 1 of 28 slices shown (40 of 48)]
[im 1/28]
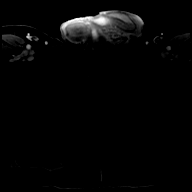

[Series 51: T1 · axial · 3.0mm · 1.15mm/px · 1 of 28 slices shown (41 of 48)]
[im 1/28]
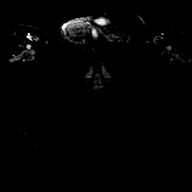

[Series 52: T1 · axial · 3.0mm · 1.15mm/px · 1 of 28 slices shown (42 of 48)]
[im 1/28]
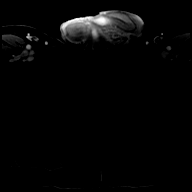

[Series 53: T1 · axial · 3.0mm · 1.15mm/px · 1 of 28 slices shown (43 of 48)]
[im 1/28]
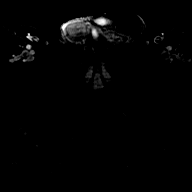

[Series 54: T1 · axial · 3.0mm · 1.15mm/px · 1 of 28 slices shown (44 of 48)]
[im 1/28]
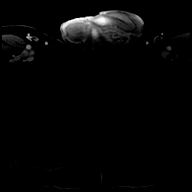

[Series 55: T1 · axial · 3.0mm · 1.15mm/px · 1 of 28 slices shown (45 of 48)]
[im 1/28]
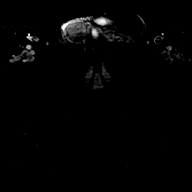

[Series 56: T1 · axial · 3.0mm · 1.15mm/px · 1 of 28 slices shown (46 of 48)]
[im 1/28]
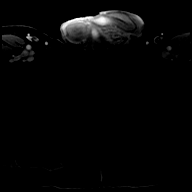

[Series 57: T1 · axial · 3.0mm · 1.15mm/px · 1 of 28 slices shown (47 of 48)]
[im 1/28]
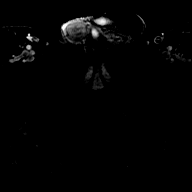

[Series 58: T1 · axial · 3.0mm · 1.15mm/px · 1 of 28 slices shown (48 of 48)]
[im 1/28]
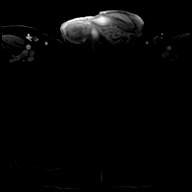

[56 of 56 positions shown; findings below may reference images not displayed]

FINDINGS: Prostate:

Region of interest # 1: PI-RADS category 5 lesion of the right
posterolateral peripheral zone in the mid gland and apex, with
focally reduced T2 signal (image 42, series 10), prominent
restriction of diffusion (image 14, series 9), and focal early
contrast enhancement (image 17, series 21). This measures 1.02 cc
(1.6 by 0.8 by 1.2 cm).

The right peripheral zone is mildly asymmetrically smaller than the
left.

Volume: 3D volumetric analysis: Prostate volume 26.07 cc (4.8 by
by 3.5 cm).

Transcapsular spread:  Absent

Seminal vesicle involvement: Absent

Neurovascular bundle involvement: Absent

Pelvic adenopathy: Absent

Bone metastasis: Absent

Other findings: Borderline retraction of the right testicle. Cystic
lesion partially imaged along the margin of the left testicle,
probably a spermatocele or epididymal cyst.
IMPRESSION: 1. PI-RADS category 5 lesion of the right posterolateral peripheral
zone. Targeting data sent to [facility].
2. Borderline retraction of the right testicle.
3. Cystic lesion partially imaged along the margin of the left
testicle, probably a spermatocele or epididymal cyst.

## 2022-06-05 ENCOUNTER — Encounter: Payer: Self-pay | Admitting: Pulmonary Disease

## 2022-06-05 ENCOUNTER — Telehealth: Payer: Self-pay | Admitting: Internal Medicine

## 2022-06-05 DIAGNOSIS — G4733 Obstructive sleep apnea (adult) (pediatric): Secondary | ICD-10-CM

## 2022-06-05 NOTE — Telephone Encounter (Signed)
Vitamin D, A1c and cholesterol need to be check if we can check fasting labs  Schedule with wife same day fasting both   Thank you

## 2022-06-05 NOTE — Addendum Note (Signed)
Addended by: Orland Mustard on: 06/05/2022 08:35 PM   Modules accepted: Orders

## 2022-06-05 NOTE — Telephone Encounter (Signed)
He needs to be schedule for a CPAP titration study in the sleep lab to determine if he needs different CPAP settings and assess for other sleep disorders that could be causing these behaviors at night.  Please schedule him for a CPAP titration if he is agreeable.

## 2022-06-05 NOTE — Telephone Encounter (Signed)
Dr. Sood, please advise. Thanks 

## 2022-06-28 ENCOUNTER — Ambulatory Visit: Payer: Medicare HMO | Attending: Pulmonary Disease

## 2022-06-28 DIAGNOSIS — G4733 Obstructive sleep apnea (adult) (pediatric): Secondary | ICD-10-CM | POA: Diagnosis not present

## 2022-06-28 DIAGNOSIS — I272 Pulmonary hypertension, unspecified: Secondary | ICD-10-CM | POA: Diagnosis not present

## 2022-06-28 DIAGNOSIS — G4750 Parasomnia, unspecified: Secondary | ICD-10-CM | POA: Insufficient documentation

## 2022-06-29 ENCOUNTER — Telehealth (HOSPITAL_BASED_OUTPATIENT_CLINIC_OR_DEPARTMENT_OTHER): Payer: Medicare HMO | Admitting: Pulmonary Disease

## 2022-06-29 DIAGNOSIS — G4733 Obstructive sleep apnea (adult) (pediatric): Secondary | ICD-10-CM

## 2022-06-29 DIAGNOSIS — G4752 REM sleep behavior disorder: Secondary | ICD-10-CM

## 2022-06-29 NOTE — Telephone Encounter (Signed)
Please schedule next available ROV with me or one of the NPs to discuss treatment options for REM sleep behavior disorder which is likely causing his dream enactment when he is asleep.

## 2022-06-29 NOTE — Telephone Encounter (Signed)
CPAP 10 cm with full face mask He had increased tone in REM sleep c/w REM behavior disorder Please corelate clinically & consider treatment

## 2022-06-30 ENCOUNTER — Other Ambulatory Visit: Payer: Self-pay | Admitting: Internal Medicine

## 2022-06-30 ENCOUNTER — Other Ambulatory Visit: Payer: Self-pay

## 2022-06-30 DIAGNOSIS — G4733 Obstructive sleep apnea (adult) (pediatric): Secondary | ICD-10-CM

## 2022-06-30 DIAGNOSIS — G4752 REM sleep behavior disorder: Secondary | ICD-10-CM

## 2022-06-30 DIAGNOSIS — Z9989 Dependence on other enabling machines and devices: Secondary | ICD-10-CM | POA: Diagnosis not present

## 2022-06-30 DIAGNOSIS — E559 Vitamin D deficiency, unspecified: Secondary | ICD-10-CM

## 2022-06-30 NOTE — Telephone Encounter (Signed)
Called and notified patient. Appt made for Next available in [place] office 8/14 with Roxan Diesel NP.

## 2022-07-05 DIAGNOSIS — N529 Male erectile dysfunction, unspecified: Secondary | ICD-10-CM | POA: Diagnosis not present

## 2022-07-05 DIAGNOSIS — J301 Allergic rhinitis due to pollen: Secondary | ICD-10-CM | POA: Diagnosis not present

## 2022-07-05 DIAGNOSIS — Z1389 Encounter for screening for other disorder: Secondary | ICD-10-CM | POA: Diagnosis not present

## 2022-07-05 DIAGNOSIS — C61 Malignant neoplasm of prostate: Secondary | ICD-10-CM | POA: Diagnosis not present

## 2022-07-05 DIAGNOSIS — I10 Essential (primary) hypertension: Secondary | ICD-10-CM | POA: Diagnosis not present

## 2022-07-07 DIAGNOSIS — G4733 Obstructive sleep apnea (adult) (pediatric): Secondary | ICD-10-CM | POA: Diagnosis not present

## 2022-07-10 ENCOUNTER — Ambulatory Visit: Payer: Medicare HMO | Admitting: Nurse Practitioner

## 2022-07-21 ENCOUNTER — Ambulatory Visit (LOCAL_COMMUNITY_HEALTH_CENTER): Payer: 59

## 2022-07-21 DIAGNOSIS — Z23 Encounter for immunization: Secondary | ICD-10-CM

## 2022-07-21 DIAGNOSIS — Z719 Counseling, unspecified: Secondary | ICD-10-CM

## 2022-07-21 NOTE — Progress Notes (Signed)
  Are you feeling sick today? No   Have you ever received a dose of COVID-19 Vaccine? [employer], [place], [place], [place], Other) Yes  If yes, which vaccine and how many doses?   FPIZER, 5   Did you bring the vaccination record card or other documentation?  Yes   Do you have a health condition or are undergoing treatment that makes you moderately or severely immunocompromised? This would include, but not be limited to: cancer, HIV, organ transplant, immunosuppressive therapy/high-dose corticosteroids, or moderate/severe primary immunodeficiency. YES, Prostate CA 2023  Have you received COVID-19 vaccine before or during hematopoietic cell transplant (HCT) or CAR-T-cell therapies? No  Have you ever had an allergic reaction to: (This would include a severe allergic reaction or a reaction that caused hives, swelling, or respiratory distress, including wheezing.) A component of a COVID-19 vaccine or a previous dose of COVID-19 vaccine? No   Have you ever had an allergic reaction to another vaccine (other thanCOVID-19 vaccine) or an injectable medication? (This would include a severe allergic reaction or a reaction that caused hives, swelling, or respiratory distress, including wheezing.)   No    Do you have a history of any of the following:  Myocarditis or Pericarditis No  Dermal fillers:  No  Multisystem Inflammatory Syndrome (MIS-C or MIS-A)? No  COVID-19 disease within the past 3 months? No  Vaccinated with monkeypox vaccine in the last 4 weeks? No  Pt in clinic for [employer] booster. Eligible and administered, tolerated well. Given copy of [facility], VIS.  M.Koreen Lizaola, LPN.

## 2022-08-03 ENCOUNTER — Encounter: Payer: Self-pay | Admitting: Pulmonary Disease

## 2022-08-03 NOTE — Telephone Encounter (Signed)
Okay to schedule video visit with me tomorrow afternoon if he is available.

## 2022-08-03 NOTE — Telephone Encounter (Signed)
Dr. Sood, please advise. Thanks 

## 2022-08-04 ENCOUNTER — Encounter: Payer: Self-pay | Admitting: Pulmonary Disease

## 2022-08-04 ENCOUNTER — Telehealth (INDEPENDENT_AMBULATORY_CARE_PROVIDER_SITE_OTHER): Payer: Medicare HMO | Admitting: Pulmonary Disease

## 2022-08-04 DIAGNOSIS — G4759 Other parasomnia: Secondary | ICD-10-CM

## 2022-08-04 DIAGNOSIS — G4752 REM sleep behavior disorder: Secondary | ICD-10-CM

## 2022-08-04 DIAGNOSIS — G4733 Obstructive sleep apnea (adult) (pediatric): Secondary | ICD-10-CM | POA: Diagnosis not present

## 2022-08-04 MED ORDER — MELATONIN 5 MG PO TABS: 5.0000 mg | ORAL_TABLET | Freq: Every day | ORAL | 0 refills | Status: AC

## 2022-08-04 NOTE — Patient Instructions (Signed)
Melatonin 5 mg nightly.  You can increase this to 10 mg nightly as needed if you are still having nighttime events.  Will have [employer] refit you to a full face CPAP mask.  Follow up with Geraldo Pitter as schedule on 09/26/22.

## 2022-08-04 NOTE — Progress Notes (Signed)
[facility]  Chief Complaint  Patient presents with   Follow-up    Has questions about sleep study     Constitutional:  There were no vitals taken for this visit.  Past Medical History:  Allergies, Pre-diabetes, COVID 15 August 2021, Hiatal hernia, HTN, Pre-DM  Past Surgical History:  His  has a past surgical history that includes Hernia repair; Axillary lymph node biopsy (Right, 09/18/2019); left cataract surgery (Left); Cardiac catheterization; Prostate biopsy (N/A, 02/23/2022); and High intensity focused ultrasound (hifu) of the prostrate (N/A, 04/13/2022).  Brief Summary:  David Church is a 71 y.o. male former smoker with obstructive sleep apnea and dream enactment.      Subjective:  Virtual Visit via Video Note  I connected with David Church on 08/04/22 at  2:30 PM EDT by a video enabled telemedicine application and verified that I am speaking with the correct person using two identifiers.  Location: Patient: home Provider: medical office   I discussed the limitations of evaluation and management by telemedicine and the availability of in person appointments. The patient expressed understanding and agreed to proceed.  History of Present Illness:  His wife in on the video also.  He had CPAP titration study.  This showed he had good control of sleep apnea with CPAP 10 cm H2O.  He had increased REM tone that is consistent with REM sleep behavior disorder.  His wife says he has these events happen nightly.  These will start with him screaming.  He will then punch and kick, and sometimes jump out of bed.  He doesn't remember what happened after she wakes him up, and it can sometimes be difficult for her to wake him up.  He has a couple of beers during the day, and a shot of liquor at night.  No family history of Parkinson's disease or other similar neurodegenerative disorders.  He has not had tremor or difficult with his  gait.  He used a full face mask during his sleep study, and this fit better.  Physical Exam:   Alert, pleasant, speaks in full sentences, no distress.     Sleep Tests:  HST 10/13/20 >> AHI 27.8, SpO2 low 90% Auto CPAP 12/08/20 to 12/23/20 >> used on 16 of 16 nights with average 9 hours 43 min.  Average AHI 5 with mean CPAP 8 and 95 th percentile CPAP 9 cm H2O. CPAP titration 06/28/22 >> CPAP 10 cm H2O, increased REM tone  Social History:  He  reports that he quit smoking about 20 months ago. His smoking use included cigarettes. He has a 4.90 pack-year smoking history. He has never used smokeless tobacco. He reports current alcohol use of about 2.0 standard drinks of alcohol per week. He reports that he does not currently use drugs after having used the following drugs: Marijuana and Cocaine.  Family History:  His family history includes Alzheimer's disease in his father; Cancer (age of onset: 84) in his mother; Diabetes in his mother; Heart attack in his father; Hypertension in his mother; Lung cancer in his maternal uncle; Stomach cancer in his maternal aunt.     Plan:   Obstructive sleep apnea. - he is compliant with CPAP and reports benefit from therapy - uses [employer] for his DME - he has a Luna device - continue auto CPAP 5 to 15 cm H2O - will arrange for refitting to a full face CPAP mask  Parasomnias. - his recent CPAP titration study was consistent  with REM sleep behavior disorder - his wife's description of these events is more suggestive of a NREM parasomnia - he might in fact have both - I have asked him to start melatonin 5 mg nightly and increase to 10 mg as needed - if this does not improve control of these events, then will try him on klonopin starting at 1 mg nightly and titrate up as needed - discussed how REM sleep behavior disorder can be associate with neurodegenerative disorders later in life, and will need to monitor for this - discussed importance of limiting  alcohol consumption   Time Spent Involved in Patient Care on Day of Examination:    I discussed the assessment and treatment plan with the patient. The patient was provided an opportunity to ask questions and all were answered. The patient agreed with the plan and demonstrated an understanding of the instructions.   The patient was advised to call back or seek an in-person evaluation if the symptoms worsen or if the condition fails to improve as anticipated.  I provided 28 minutes minutes of non-face-to-face time during this encounter.   Follow up:   Patient Instructions  Melatonin 5 mg nightly.  You can increase this to 10 mg nightly as needed if you are still having nighttime events.  Will have [employer] refit you to a full face CPAP mask.  Follow up with Geraldo Pitter as schedule on 09/26/22.  Medication List:   Allergies as of 08/04/2022   No Known Allergies      Medication List        Accurate as of August 04, 2022  2:53 PM. If you have any questions, ask your nurse or doctor.          STOP taking these medications    tamsulosin 0.4 MG Caps capsule Commonly known as: FLOMAX Stopped by: Chesley Mires, MD       TAKE these medications    amLODipine 5 MG tablet Commonly known as: Norvasc Take 1 tablet (5 mg total) by mouth in the morning.   docusate sodium 100 MG capsule Commonly known as: COLACE Take 2 capsules (200 mg total) by mouth 2 (two) times daily.   fluticasone 50 MCG/ACT nasal spray Commonly known as: FLONASE Place 1-2 sprays into both nostrils daily as needed.   Hyoscyamine Sulfate SL 0.125 MG Subl Commonly known as: Levsin/SL Place 0.125 mg under the tongue every 4 (four) hours as needed (bladder). 1-2 TABS   melatonin 5 MG Tabs Take 1 tablet (5 mg total) by mouth at bedtime. Started by: Chesley Mires, MD   multivitamin tablet Take 1 tablet by mouth daily. One A Day   tadalafil 5 MG tablet Commonly known as: CIALIS Take 5 mg by mouth  every evening.   Vitamin D3 1.25 MG (50000 UT) Caps Take 50,000 Units by mouth every Sunday. d3        Signature:  Chesley Mires, MD [place] Pager - [phone] 08/04/2022, 2:53 PM

## 2022-08-07 ENCOUNTER — Encounter: Payer: Self-pay | Admitting: Pulmonary Disease

## 2022-08-08 DIAGNOSIS — G4733 Obstructive sleep apnea (adult) (pediatric): Secondary | ICD-10-CM | POA: Diagnosis not present

## 2022-08-14 DIAGNOSIS — C61 Malignant neoplasm of prostate: Secondary | ICD-10-CM | POA: Diagnosis not present

## 2022-08-14 DIAGNOSIS — R9721 Rising PSA following treatment for malignant neoplasm of prostate: Secondary | ICD-10-CM | POA: Diagnosis not present

## 2022-08-14 DIAGNOSIS — D400 Neoplasm of uncertain behavior of prostate: Secondary | ICD-10-CM | POA: Diagnosis not present

## 2022-08-14 DIAGNOSIS — N5201 Erectile dysfunction due to arterial insufficiency: Secondary | ICD-10-CM | POA: Diagnosis not present

## 2022-08-17 DIAGNOSIS — H524 Presbyopia: Secondary | ICD-10-CM | POA: Diagnosis not present

## 2022-09-26 ENCOUNTER — Encounter: Payer: Self-pay | Admitting: Primary Care

## 2022-09-26 ENCOUNTER — Ambulatory Visit (INDEPENDENT_AMBULATORY_CARE_PROVIDER_SITE_OTHER): Payer: Medicare HMO | Admitting: Primary Care

## 2022-09-26 DIAGNOSIS — G4733 Obstructive sleep apnea (adult) (pediatric): Secondary | ICD-10-CM | POA: Diagnosis not present

## 2022-09-26 DIAGNOSIS — G4752 REM sleep behavior disorder: Secondary | ICD-10-CM | POA: Diagnosis not present

## 2022-09-26 NOTE — Progress Notes (Signed)
$'@Patient't$  ID: David Church, male    DOB: October 22, 1951, 71 y.o.   MRN: [id number]  Chief Complaint  Patient presents with   Follow-up    Wearing cpap avg 7hr nightly- pressure and mask is okay.    Referring provider: McLean-Scocuzza, Olivia Mackie *  HPI: 71 year old male, former smoker.  Past medical history significant for OSA on CPAP, hypertension, prostate cancer, hyperlipidemia.   09/26/2022 Patient is here to today to discuss REM sleep behavior disorder.  He has a history of sleep apnea and is moderately compliant with CPAP use. He talks in his sleep and acts out his dream. He shares a bed with his wife, he has never injured her. He does not sleep walk. He has seen slightly improvement in sleep quality since taking '10mg'$  melatonin at bedtime. He does drink 1-2 beers a day and may have a shot of alcohol. He does not have firearms or weapons in the house. No issues with CPAP mask.   Icode Connect compliant report 05/07/22-08/07/22 76/93 days used (81%); 74% > 4 hours Average usage 5 hours 47 mins Pressure 5-15cm h20 (9.2cm h20-95%) Ramp pressure 4cm h20 Average leak 3.1L/min AHI 9.4/ CAI 6.6    No Known Allergies  Immunization History  Administered Date(s) Administered   PFIZER Comirnaty(Gray Top)Covid-19 Tri-Sucrose Vaccine 07/01/2021   PFIZER(Purple Top)SARS-COV-2 Vaccination 01/29/2020, 02/19/2020, 08/27/2020   Pfizer Covid-19 Vaccine Bivalent Booster 85yr & up 11/22/2021, 07/21/2022    Past Medical History:  Diagnosis Date   Allergy    Closed compression fracture of L5 vertebra ([facility]    COVID-19 08/24/2021   molnupiravir x bid x 5 days   DDD (degenerative disc disease), lumbar    Granulomatous lung disease (HCC)    Hemangioma    Hiatal hernia    Hypertension    Liver cyst    OSA on CPAP    Parasomnia    Prediabetes    6.1 10/23/18   [place]    REM sleep behavior disorder     Tobacco History: Social History   Tobacco Use  Smoking Status Former    Packs/day: 0.10   Years: 49.00   Total pack years: 4.90   Types: Cigarettes   Quit date: 11/2020   Years since quitting: 1.8  Smokeless Tobacco Never   Counseling given: Not Answered   Outpatient Medications Prior to Visit  Medication Sig Dispense Refill   amLODipine (NORVASC) 5 MG tablet Take 1 tablet (5 mg total) by mouth in the morning. 90 tablet 3   Cholecalciferol (VITAMIN D3) 1.25 MG (50000 UT) CAPS Take 50,000 Units by mouth every Sunday. d3 12 capsule 1   fluticasone (FLONASE) 50 MCG/ACT nasal spray Place 1-2 sprays into both nostrils daily as needed. 30 mL 11   melatonin 5 MG TABS Take 1 tablet (5 mg total) by mouth at bedtime.  0   Multiple Vitamin (MULTIVITAMIN) tablet Take 1 tablet by mouth daily. One A Day     tadalafil (CIALIS) 5 MG tablet Take 5 mg by mouth every evening.     docusate sodium (COLACE) 100 MG capsule Take 2 capsules (200 mg total) by mouth 2 (two) times daily. 120 capsule 3   Hyoscyamine Sulfate SL (LEVSIN/SL) 0.125 MG SUBL Place 0.125 mg under the tongue every 4 (four) hours as needed (bladder). 1-2 TABS 20 tablet 3   No facility-administered medications prior to visit.   Review of Systems  Review of Systems  Constitutional: Negative.  Negative for fatigue.  HENT: Negative.  Respiratory: Negative.    Psychiatric/Behavioral:  Positive for sleep disturbance.    Physical Exam  BP 118/72 (BP Location: Left Arm, Cuff Size: Normal)   Pulse 85   Temp 97.8 F (36.6 C) (Temporal)   Ht '5\' 9"'$  (1.753 m)   Wt 157 lb (71.2 kg)   SpO2 96%   BMI 23.18 kg/m  Physical Exam Constitutional:      Appearance: Normal appearance.  HENT:     Head: Normocephalic and atraumatic.     Mouth/Throat:     Mouth: Mucous membranes are moist.     Pharynx: Oropharynx is clear.  Cardiovascular:     Rate and Rhythm: Normal rate and regular rhythm.  Pulmonary:     Effort: Pulmonary effort is normal.     Breath sounds: Normal breath sounds.  Musculoskeletal:         General: Normal range of motion.  Skin:    General: Skin is warm and dry.  Neurological:     General: No focal deficit present.     Mental Status: He is alert and oriented to person, place, and time. Mental status is at baseline.  Psychiatric:        Mood and Affect: Mood normal.        Behavior: Behavior normal.        Thought Content: Thought content normal.        Judgment: Judgment normal.      Lab Results:  CBC    Component Value Date/Time   WBC 3.5 (L) 04/07/2022 1130   RBC 5.44 04/07/2022 1130   HGB 16.2 04/07/2022 1130   HCT 48.6 04/07/2022 1130   PLT 199 04/07/2022 1130   MCV 89.3 04/07/2022 1130   MCH 29.8 04/07/2022 1130   MCHC 33.3 04/07/2022 1130   RDW 12.5 04/07/2022 1130   LYMPHSABS 2.1 11/17/2021 0949   MONOABS 0.5 11/17/2021 0949   EOSABS 0.0 11/17/2021 0949   BASOSABS 0.0 11/17/2021 0949    BMET    Component Value Date/Time   NA 140 04/07/2022 1130   K 4.2 04/07/2022 1130   CL 106 04/07/2022 1130   CO2 28 04/07/2022 1130   GLUCOSE 109 (H) 04/07/2022 1130   BUN 11 04/07/2022 1130   CREATININE 1.25 (H) 04/07/2022 1130   CALCIUM 9.0 04/07/2022 1130   GFRNONAA >60 04/07/2022 1130    BNP No results found for: "BNP"  ProBNP No results found for: "PROBNP"  Imaging: No results found.   Assessment & Plan:   OSA on CPAP - HST 10/13/20 >> AHI 27.8, SpO2 low 90%. Patient is 74% compliant with CPAP use greater than 4 hours over the last 90 days. Current pressure setting auto 5-15 cm H2O (9.2cm h20-95%); Average AHI 9.4/hr (CAI 6.6/hr). No significant air leaks, average 3.1L/min. No changes at this time, consider getting updated CPAP titration study. Advised he decrease alcohol use prior to bedtime. FU in 4 weeks.   REM sleep behavior disorder - Patient talks in his sleep and acts out dreams. No sleep walking. He has seem some improvement since being on Melatonin. Recommend increasing Melatonin dose to 15 mg at bedtime. If patient continues to have  symptoms recommend trying clonazepam 0.25-0.'5mg'$ .  - Advised patient establish a safe sleeping environment and avoid alcohol use prior to bedtime   Martyn Ehrich, NP 09/26/2022

## 2022-09-26 NOTE — Patient Instructions (Addendum)
  Symptoms of REM behavior disorders include repeating episodes of sleep-related vocalization and/or complex motor behaviors during REM sleep. Causes of REM sleep disorder can be related to alcohol, antidepressants and sleep apnea. Treatment including either melatonin or clonazepam   Recommend we go up on melatonin dose first before changing you to a different medication  Recommendations:  - Take 1.5 tabs ('15mg'$ ) melatonin at bedtime for sleep and REM sleep disorder  - Continue to wear CPAP every night 4-6 hours or longer  - It is important that you establish a safe sleeping environment- firearm or sharp objects should not be accessible, keys should be put away and doors should be locked   Follow-up: - 2-4 week virtual visit with Beth NP/ REM sleep disorder

## 2022-09-26 NOTE — Assessment & Plan Note (Addendum)
-   Patient talks in his sleep and acts out dreams. No sleep walking. He has seem some improvement since being on Melatonin. Recommend increasing Melatonin dose to 15 mg at bedtime. If patient continues to have symptoms recommend trying clonazepam 0.25-0.'5mg'$ .  - Advised patient establish a safe sleeping environment and avoid alcohol use prior to bedtime

## 2022-09-26 NOTE — Assessment & Plan Note (Addendum)
-   HST 10/13/20 >> AHI 27.8, SpO2 low 90%. Patient is 74% compliant with CPAP use greater than 4 hours over the last 90 days. Current pressure setting auto 5-15 cm H2O (9.2cm h20-95%); Average AHI 9.4/hr (CAI 6.6/hr). No significant air leaks, average 3.1L/min. No changes at this time, consider getting updated CPAP titration study. Advised he decrease alcohol use prior to bedtime. FU in 4 weeks.

## 2022-09-29 NOTE — Progress Notes (Signed)
Reviewed and agree with assessment/plan.   Chesley Mires, MD [facility] 09/29/2022, 7:44 AM Pager:  [phone]

## 2022-10-10 ENCOUNTER — Telehealth: Payer: Medicare HMO | Admitting: Primary Care

## 2022-10-10 DIAGNOSIS — G4733 Obstructive sleep apnea (adult) (pediatric): Secondary | ICD-10-CM | POA: Diagnosis not present

## 2022-10-10 DIAGNOSIS — G4752 REM sleep behavior disorder: Secondary | ICD-10-CM

## 2022-10-10 NOTE — Progress Notes (Signed)
Reviewed and agree with assessment/plan.   Chesley Mires, MD [facility] 10/10/2022, 2:19 PM Pager:  [phone]

## 2022-10-10 NOTE — Progress Notes (Signed)
Virtual Visit via Video Note  I connected with David Church on 10/10/22 at  1:30 PM EST by a video enabled telemedicine application and verified that I am speaking with the correct person using two identifiers.  Location: Patient: Home Provider: Office    I discussed the limitations of evaluation and management by telemedicine and the availability of in person appointments. The patient expressed understanding and agreed to proceed.  HPI: 71 year old male, former smoker.  Past medical history significant for OSA on CPAP, hypertension, prostate cancer, hyperlipidemia. Patient of Dr. Halford Chessman.   Previous [facility] pulmonary encounter:  09/26/2022 Patient is here to today to discuss REM sleep behavior disorder.  He has a history of sleep apnea and is moderately compliant with CPAP use. He talks in his sleep and acts out his dream. He shares a bed with his wife, he has never injured her. He does not sleep walk. He has seen slightly improvement in sleep quality since taking '10mg'$  melatonin at bedtime. He does drink 1-2 beers a day and may have a shot of alcohol. He does not have firearms or weapons in the house. No issues with CPAP mask.   Icode Connect compliant report 05/07/22-08/07/22 76/93 days used (81%); 74% > 4 hours Average usage 5 hours 47 mins Pressure 5-15cm h20 (9.2cm h20-95%) Ramp pressure 4cm h20 Average leak 3.1L/min AHI 9.4/ CAI 6.6    10/10/2022- Interim hx  Patient contacted today for virtual visit/ follow-up OSA and parasomnias. He is doing well. No current issues. He was refitted for CPAP mask. He reports compliance with CPAP. He has a luna machine. Current settings 5-15cm h20. During our last visit patient described symptoms of sleep talking and reports acting out his dream. Parasomnias have quieted down since increasing melatonin to '15mg'$ . He would like to stay on this medication.    Observations/Objective:  - Appears well; No respiratory symptoms  Assessment and Plan:  OSA on  CPAP - HST 10/13/20 >> AHI 27.8, SpO2 low 90%  - Patient reports compliance with CPAP and benefit from use - He has a luna machine, requesting download  - Current pressure 5-15cm h20 - Limit alcohol prior to bedtime  - If continues to have residual events needs titration study in lab - FU in 3 months or sooner if needed   REM sleep behavior disorder: - Improved on Melatonin '15mg'$  qhs, he would like to stay on this medication. If symptoms worsen consider changing to Clonazepam (klonopin) '1mg'$   - Reinforced importance of limiting alcohol use at bedtime and establish a safe sleeping environment   Follow Up Instructions:   - FU in 3 months or sooner if needed/ virtual visit ok   I discussed the assessment and treatment plan with the patient. The patient was provided an opportunity to ask questions and all were answered. The patient agreed with the plan and demonstrated an understanding of the instructions.   The patient was advised to call back or seek an in-person evaluation if the symptoms worsen or if the condition fails to improve as anticipated.  I provided 18 minutes of non-face-to-face time during this encounter.   Martyn Ehrich, NP

## 2022-10-10 NOTE — Patient Instructions (Signed)
  Continue Melatonin '15mg'$  at bedtime  Limit alcohol at bedtime  Keep safe sleep environment  3 month follow-up

## 2022-11-08 DIAGNOSIS — C61 Malignant neoplasm of prostate: Secondary | ICD-10-CM | POA: Diagnosis not present

## 2022-11-08 DIAGNOSIS — N5201 Erectile dysfunction due to arterial insufficiency: Secondary | ICD-10-CM | POA: Diagnosis not present

## 2022-11-08 DIAGNOSIS — Z79899 Other long term (current) drug therapy: Secondary | ICD-10-CM | POA: Diagnosis not present

## 2022-11-08 DIAGNOSIS — D400 Neoplasm of uncertain behavior of prostate: Secondary | ICD-10-CM | POA: Diagnosis not present

## 2022-11-09 DIAGNOSIS — C61 Malignant neoplasm of prostate: Secondary | ICD-10-CM | POA: Diagnosis not present

## 2022-11-09 DIAGNOSIS — Z79899 Other long term (current) drug therapy: Secondary | ICD-10-CM | POA: Diagnosis not present

## 2022-11-09 DIAGNOSIS — N5201 Erectile dysfunction due to arterial insufficiency: Secondary | ICD-10-CM | POA: Diagnosis not present

## 2022-11-09 DIAGNOSIS — D400 Neoplasm of uncertain behavior of prostate: Secondary | ICD-10-CM | POA: Diagnosis not present

## 2023-01-15 ENCOUNTER — Ambulatory Visit
Admission: EM | Admit: 2023-01-15 | Discharge: 2023-01-15 | Disposition: A | Discharge status: Home / Self Care | Payer: Medicare HMO | Attending: Emergency Medicine | Admitting: Emergency Medicine

## 2023-01-15 DIAGNOSIS — R509 Fever, unspecified: Secondary | ICD-10-CM | POA: Diagnosis not present

## 2023-01-15 DIAGNOSIS — J0100 Acute maxillary sinusitis, unspecified: Secondary | ICD-10-CM | POA: Diagnosis not present

## 2023-01-15 MED ORDER — AMOXICILLIN 875 MG PO TABS: 875.0000 mg | ORAL_TABLET | Freq: Two times a day (BID) | ORAL | 0 refills | Status: AC

## 2023-01-15 MED ORDER — ACETAMINOPHEN 325 MG PO TABS
650.0000 mg | ORAL_TABLET | Freq: Once | ORAL | Status: AC
  Administered 2023-01-15: 650 mg via ORAL

## 2023-01-15 NOTE — ED Triage Notes (Addendum)
Patient to Urgent Care with complaints of  chills/ fevers/ headaches. Sinus pain and pressure.   Symptoms started 2/14. Has been taking tylenol- last dose 6 hours+ ago.   Reports being seen at the dentist two weeks ago and having a deep cleaning. Has been having upper and lower left sided jaw pain with some swelling since.

## 2023-01-15 NOTE — Discharge Instructions (Addendum)
Take the amoxicillin as directed.  Follow up with your primary care provider if your symptoms are not improving.   ° ° °

## 2023-01-15 NOTE — ED Provider Notes (Signed)
David Church    CSN: [id number] Arrival date & time: 01/15/23  0909      History   Chief Complaint Chief Complaint  Patient presents with   Headache   Chills   Oral Swelling    HPI David Church is a 72 y.o. male.  Patient presents with fever and chills x 1 day.  He reports headache, mild congestion, sinus tenderness, mild cough x 5 days.  Treating at home with Tylenol; last taken at 0500 today.  He reports similar symptoms with previous sinus infection.  He denies ear pain, sore throat, shortness of breath, chest pain, vomiting, diarrhea, or other symptoms.  Negative COVID test at home yesterday.  His medical history includes prostate cancer, hypertension, obstructive sleep apnea, prediabetes.   The history is provided by the patient and medical records.    Past Medical History:  Diagnosis Date   Allergy    Closed compression fracture of L5 vertebra ([place])    COVID-19 08/24/2021   molnupiravir x bid x 5 days   DDD (degenerative disc disease), lumbar    Granulomatous lung disease ([place])    Hemangioma    Hiatal hernia    Hypertension    Liver cyst    OSA on CPAP    Parasomnia    Prediabetes    6.1 10/23/18   Prostate cancer ([place])    REM sleep behavior disorder     Patient Active Problem List   Diagnosis Date Noted   REM sleep behavior disorder 09/26/2022   Vitamin D deficient rickets 05/10/2022   Prostate cancer ([place]) 04/13/2022   Vitamin D deficiency 11/17/2021   Elevated PSA 11/17/2021   Liver cyst 11/01/2021   Prediabetes 11/01/2021   Abnormal MRI, cervical spine 11/23/2020   History of granulomatous disease 11/23/2020   Annual physical exam 11/18/2020   Seasonal allergies 11/18/2020   Smoker 10/26/2020   Healthcare maintenance 10/26/2020   OSA on CPAP 10/25/2020   Parasomnia 06/30/2020   Essential hypertension 03/18/2020   Hyperlipidemia 01/13/2020    Past Surgical History:  Procedure Laterality Date   AXILLARY LYMPH NODE BIOPSY Right  09/18/2019   Procedure: AXILLARY LYMPH NODE BIOPSY;  Surgeon: David Husbands, MD;  Location: [facility] ORS;  Service: General;  Laterality: Right;   CARDIAC CATHETERIZATION     HERNIA REPAIR     15 years ago-right inguinal    HIGH INTENSITY FOCUSED ULTRASOUND (HIFU) OF THE PROSTATE N/A 04/13/2022   Procedure: HIGH INTENSITY FOCUSED ULTRASOUND (HIFU) OF THE PROSTATE;  Surgeon: David Cowper, MD;  Location: [facility] ORS;  Service: Urology;  Laterality: N/A;   left cataract surgery Left    fall 2022   PROSTATE BIOPSY N/A 02/23/2022   Procedure: PROSTATE BIOPSY David Church;  Surgeon: David Cowper, MD;  Location: [facility] ORS;  Service: Urology;  Laterality: N/A;       Home Medications    Prior to Admission medications   Medication Sig Start Date End Date Taking? Authorizing Provider  amoxicillin (AMOXIL) 875 MG tablet Take 1 tablet (875 mg total) by mouth 2 (two) times daily for 7 days. 01/15/23 01/22/23 Yes Sharion Balloon, NP  amLODipine (NORVASC) 5 MG tablet Take 1 tablet (5 mg total) by mouth in the morning. 04/28/22   McLean-Scocuzza, Nino Glow, MD  Cholecalciferol (VITAMIN D3) 1.25 MG (50000 UT) CAPS Take 50,000 Units by mouth every Sunday. d3 07/02/22   McLean-Scocuzza, Nino Glow, MD  fluticasone (FLONASE) 50 MCG/ACT nasal spray Place 1-2 sprays into both  nostrils daily as needed. 04/28/22   McLean-Scocuzza, Nino Glow, MD  melatonin 5 MG TABS Take 1 tablet (5 mg total) by mouth at bedtime. 08/04/22   Chesley Mires, MD  Multiple Vitamin (MULTIVITAMIN) tablet Take 1 tablet by mouth daily. One A Day    [provider]  tadalafil (CIALIS) 5 MG tablet Take 5 mg by mouth every evening. 03/10/22   [provider]    Family History Family History  Problem Relation Age of Onset   Cancer Mother 45   Diabetes Mother    Hypertension Mother    Alzheimer's disease Father    Heart attack Father    Stomach cancer Maternal Aunt    Lung cancer Maternal Uncle     Social History Social History   Tobacco  Use   Smoking status: Former    Packs/day: 0.10    Years: 49.00    Total pack years: 4.90    Types: Cigarettes    Quit date: 11/2020    Years since quitting: 2.1   Smokeless tobacco: Never  Vaping Use   Vaping Use: Never used  Substance Use Topics   Alcohol use: Yes    Alcohol/week: 2.0 standard drinks of alcohol    Types: 2 Cans of beer per week    Comment: 2 beers a day and wine on special occasion   Drug use: Not Currently    Types: Marijuana, Cocaine    Comment: did drugs but quit in early 30's     Allergies   Patient has no known allergies.   Review of Systems Review of Systems  Constitutional:  Positive for chills and fever.  HENT:  Positive for congestion and sinus pain. Negative for ear pain and sore throat.   Respiratory:  Positive for cough. Negative for shortness of breath.   Cardiovascular:  Negative for chest pain and palpitations.  Gastrointestinal:  Negative for abdominal pain, diarrhea and vomiting.  Skin:  Negative for color change and rash.  All other systems reviewed and are negative.    Physical Exam Triage Vital Signs ED Triage Vitals  Enc Vitals Group     BP      Pulse      Resp      Temp      Temp src      SpO2      Weight      Height      Head Circumference      Peak Flow      Pain Score      Pain Loc      Pain Edu?      Excl. in [place]?    No data found.  Updated Vital Signs BP 111/63   Pulse (!) 116   Temp (!) 101.6 F (38.7 C)   Resp 18   SpO2 96%   Visual Acuity Right Eye Distance:   Left Eye Distance:   Bilateral Distance:    Right Eye Near:   Left Eye Near:    Bilateral Near:     Physical Exam Vitals and nursing note reviewed.  Constitutional:      General: He is not in acute distress.    Appearance: Normal appearance. He is well-developed. He is not ill-appearing.  HENT:     Right Ear: Tympanic membrane normal.     Left Ear: Tympanic membrane normal.     Nose: Congestion present.     Mouth/Throat:      Mouth: Mucous membranes are moist.  Pharynx: Oropharynx is clear.  Cardiovascular:     Rate and Rhythm: Normal rate and regular rhythm.     Heart sounds: Normal heart sounds.  Pulmonary:     Effort: Pulmonary effort is normal. No respiratory distress.     Breath sounds: Normal breath sounds.  Musculoskeletal:     Cervical back: Neck supple.  Skin:    General: Skin is warm and dry.  Neurological:     Mental Status: He is alert.  Psychiatric:        Mood and Affect: Mood normal.        Behavior: Behavior normal.      UC Treatments / Results  Labs (all labs ordered are listed, but only abnormal results are displayed) Labs Reviewed - No data to display  EKG   Radiology No results found.  Procedures Procedures (including critical care time)  Medications Ordered in UC Medications  acetaminophen (TYLENOL) tablet 650 mg (650 mg Oral Given 01/15/23 0932)    Initial Impression / Assessment and Plan / UC Course  I have reviewed the triage vital signs and the nursing notes.  Pertinent labs & imaging results that were available during my care of the patient were reviewed by me and considered in my medical decision making (see chart for details).    Fever, acute sinusitis.  Patient has been symptomatic for 5 days.  Fever started yesterday.  Negative COVID test at home.  Treating today with amoxicillin.  Tylenol as needed.  Education provide on fever and sinus infection.  Instructed patient to follow up with his PCP.  He agrees to plan of care.    Final Clinical Impressions(s) / UC Diagnoses   Final diagnoses:  Fever, unspecified  Acute non-recurrent maxillary sinusitis     Discharge Instructions      Take the amoxicillin as directed.  Follow up with your primary care provider if your symptoms are not improving.        ED Prescriptions     Medication Sig Dispense Auth. Provider   amoxicillin (AMOXIL) 875 MG tablet Take 1 tablet (875 mg total) by mouth 2 (two)  times daily for 7 days. 14 tablet Sharion Balloon, NP      PDMP not reviewed this encounter.   Sharion Balloon, NP 01/15/23 [phone]

## 2023-01-23 DIAGNOSIS — Z6822 Body mass index (BMI) 22.0-22.9, adult: Secondary | ICD-10-CM | POA: Diagnosis not present

## 2023-01-23 DIAGNOSIS — J0100 Acute maxillary sinusitis, unspecified: Secondary | ICD-10-CM | POA: Diagnosis not present

## 2023-01-30 DIAGNOSIS — I10 Essential (primary) hypertension: Secondary | ICD-10-CM | POA: Diagnosis not present

## 2023-01-30 DIAGNOSIS — C61 Malignant neoplasm of prostate: Secondary | ICD-10-CM | POA: Diagnosis not present

## 2023-01-30 DIAGNOSIS — Z6822 Body mass index (BMI) 22.0-22.9, adult: Secondary | ICD-10-CM | POA: Diagnosis not present

## 2023-03-12 ENCOUNTER — Telehealth: Payer: Self-pay | Admitting: Pulmonary Disease

## 2023-03-12 ENCOUNTER — Telehealth (INDEPENDENT_AMBULATORY_CARE_PROVIDER_SITE_OTHER): Payer: Medicare HMO | Admitting: Pulmonary Disease

## 2023-03-12 ENCOUNTER — Encounter (HOSPITAL_BASED_OUTPATIENT_CLINIC_OR_DEPARTMENT_OTHER): Payer: Self-pay | Admitting: Pulmonary Disease

## 2023-03-12 DIAGNOSIS — G4752 REM sleep behavior disorder: Secondary | ICD-10-CM | POA: Diagnosis not present

## 2023-03-12 DIAGNOSIS — G4733 Obstructive sleep apnea (adult) (pediatric): Secondary | ICD-10-CM

## 2023-03-12 NOTE — Telephone Encounter (Signed)
PT wonders if he can make the Mychart appt a regular call. States he does not have access.  Sending as a high priority because of time crunch on this req.

## 2023-03-12 NOTE — Progress Notes (Signed)
[facility]  Chief Complaint  Patient presents with   Follow-up    Constitutional:  There were no vitals taken for this visit.  Past Medical History:  Allergies, Pre-diabetes, COVID 15 August 2021, Hiatal hernia, HTN, Pre-DM  Past Surgical History:  His  has a past surgical history that includes Hernia repair; Axillary lymph node biopsy (Right, 09/18/2019); left cataract surgery (Left); Cardiac catheterization; Prostate biopsy (N/A, 02/23/2022); and High intensity focused ultrasound (hifu) of the prostrate (N/A, 04/13/2022).  Brief Summary:  David Church is a 72 y.o. male former smoker with obstructive sleep apnea and dream enactment.      Subjective:  Virtual Visit via Telephone Note  I connected with David Church on 03/12/23 at  1:45 PM EDT by telephone and verified that I am speaking with the correct person using two identifiers.  Location: Patient: home Provider: medical office   I discussed the limitations, risks, security and privacy concerns of performing an evaluation and management service by telephone and the availability of in person appointments. I also discussed with the patient that there may be a patient responsible charge related to this service. The patient expressed understanding and agreed to proceed.   History of Present Illness:  Uses CPAP nightly.  No issues with mask fit.  Not drinking alcohol.  No dream enactment as long as he avoids red meat.  Using melatonin at night.  Gets about 7 to 8 hrs sleep.  Naps in the afternoon sometimes for about 30 minutes.  Physical Exam:   Deferred.     Sleep Tests:  HST 10/13/20 >> AHI 27.8, SpO2 low 90% Auto CPAP 12/08/20 to 12/23/20 >> used on 16 of 16 nights with average 9 hours 43 min.  Average AHI 5 with mean CPAP 8 and 95 th percentile CPAP 9 cm H2O. CPAP titration 06/28/22 >> CPAP 10 cm H2O, increased REM tone  Social History:  He  reports that he quit  smoking about 2 years ago. His smoking use included cigarettes. He has a 4.90 pack-year smoking history. He has never used smokeless tobacco. He reports current alcohol use of about 2.0 standard drinks of alcohol per week. He reports that he does not currently use drugs after having used the following drugs: Marijuana and Cocaine.  Family History:  His family history includes Alzheimer's disease in his father; Cancer (age of onset: 77) in his mother; Diabetes in his mother; Heart attack in his father; Hypertension in his mother; Lung cancer in his maternal uncle; Stomach cancer in his maternal aunt.     Plan:   Obstructive sleep apnea. - he is compliant with CPAP and reports benefit from therapy - uses [employer] for his DME - he has a Luna device - current CPAP ordered November 2021 - continue auto CPAP 5 to 15 cm H2O - will call him with results of CPAP download  Parasomnias. - from REM sleep behavior disorder, and NREM parasomnia - continue melatonin - avoid red meat (self reported trigger) and alcohol  Time Spent Involved in Patient Care on Day of Examination:    I discussed the assessment and treatment plan with the patient. The patient was provided an opportunity to ask questions and all were answered. The patient agreed with the plan and demonstrated an understanding of the instructions.   The patient was advised to call back or seek an in-person evaluation if the symptoms worsen or if the condition fails to improve as anticipated.  I  provided 25 minutes minutes of non-face-to-face time during this encounter.   Follow up:   Patient Instructions  Will call with CPAP download results  Follow up in 1 year in the [facility] office  Medication List:   Allergies as of 03/12/2023   No Known Allergies      Medication List        Accurate as of March 12, 2023  2:14 PM. If you have any questions, ask your nurse or doctor.          amLODipine 5 MG tablet Commonly known  as: Norvasc Take 1 tablet (5 mg total) by mouth in the morning.   fluticasone 50 MCG/ACT nasal spray Commonly known as: FLONASE Place 1-2 sprays into both nostrils daily as needed.   melatonin 5 MG Tabs Take 1 tablet (5 mg total) by mouth at bedtime.   multivitamin tablet Take 1 tablet by mouth daily. One A Day   tadalafil 5 MG tablet Commonly known as: CIALIS Take 5 mg by mouth every evening.   Vitamin D3 1.25 MG (50000 UT) Caps Take 50,000 Units by mouth every Sunday. d3        Signature:  Coralyn Helling, MD [facility] Pulmonary/Critical Care Pager - [phone] 03/12/2023, 2:14 PM

## 2023-03-12 NOTE — Patient Instructions (Signed)
Will call with CPAP download results  Follow up in 1 year in the [place] office

## 2023-03-15 NOTE — Telephone Encounter (Signed)
Video visit was done for pt 4/15. Closing encounter.

## 2023-04-09 ENCOUNTER — Telehealth: Payer: Self-pay | Admitting: Pulmonary Disease

## 2023-04-09 NOTE — Telephone Encounter (Signed)
Auto CPAP 12/25/22 to 03/24/23 >> used on 77 of 90 nights with average 6 hrs 2 min.  Average AHI 7.9 with median CPAP 7 and 95 th percentile CPAP 9 cm H2O   Please let him know his CPAP report looks good.  No change to set up needed.

## 2023-04-09 NOTE — Telephone Encounter (Signed)
ATC pt left detailed VM w/ VS message for pt (ok per DPR). NFN att.

## 2023-06-13 DIAGNOSIS — Z6821 Body mass index (BMI) 21.0-21.9, adult: Secondary | ICD-10-CM | POA: Diagnosis not present

## 2023-06-13 DIAGNOSIS — N401 Enlarged prostate with lower urinary tract symptoms: Secondary | ICD-10-CM | POA: Diagnosis not present

## 2023-06-13 DIAGNOSIS — C61 Malignant neoplasm of prostate: Secondary | ICD-10-CM | POA: Diagnosis not present

## 2023-06-15 DIAGNOSIS — C61 Malignant neoplasm of prostate: Secondary | ICD-10-CM | POA: Diagnosis not present

## 2023-06-15 LAB — PSA: PSA: <0.1

## 2023-06-30 DIAGNOSIS — G4733 Obstructive sleep apnea (adult) (pediatric): Secondary | ICD-10-CM | POA: Diagnosis not present

## 2023-07-31 DIAGNOSIS — G4733 Obstructive sleep apnea (adult) (pediatric): Secondary | ICD-10-CM | POA: Diagnosis not present

## 2023-08-21 DIAGNOSIS — H524 Presbyopia: Secondary | ICD-10-CM | POA: Diagnosis not present

## 2023-08-21 DIAGNOSIS — E119 Type 2 diabetes mellitus without complications: Secondary | ICD-10-CM | POA: Diagnosis not present

## 2023-08-30 DIAGNOSIS — G4733 Obstructive sleep apnea (adult) (pediatric): Secondary | ICD-10-CM | POA: Diagnosis not present

## 2023-09-03 ENCOUNTER — Ambulatory Visit (INDEPENDENT_AMBULATORY_CARE_PROVIDER_SITE_OTHER): Payer: Medicare HMO | Admitting: Family Medicine

## 2023-09-03 ENCOUNTER — Encounter: Payer: Self-pay | Admitting: Family Medicine

## 2023-09-03 VITALS — BP 136/70 | HR 74 | Temp 97.7°F | Resp 16 | Ht 69.0 in | Wt 151.2 lb

## 2023-09-03 DIAGNOSIS — J302 Other seasonal allergic rhinitis: Secondary | ICD-10-CM | POA: Diagnosis not present

## 2023-09-03 DIAGNOSIS — E538 Deficiency of other specified B group vitamins: Secondary | ICD-10-CM

## 2023-09-03 DIAGNOSIS — N529 Male erectile dysfunction, unspecified: Secondary | ICD-10-CM | POA: Diagnosis not present

## 2023-09-03 DIAGNOSIS — R972 Elevated prostate specific antigen [PSA]: Secondary | ICD-10-CM

## 2023-09-03 DIAGNOSIS — R7309 Other abnormal glucose: Secondary | ICD-10-CM

## 2023-09-03 DIAGNOSIS — C61 Malignant neoplasm of prostate: Secondary | ICD-10-CM | POA: Diagnosis not present

## 2023-09-03 DIAGNOSIS — D72819 Decreased white blood cell count, unspecified: Secondary | ICD-10-CM

## 2023-09-03 DIAGNOSIS — E785 Hyperlipidemia, unspecified: Secondary | ICD-10-CM | POA: Diagnosis not present

## 2023-09-03 DIAGNOSIS — E559 Vitamin D deficiency, unspecified: Secondary | ICD-10-CM | POA: Diagnosis not present

## 2023-09-03 DIAGNOSIS — I10 Essential (primary) hypertension: Secondary | ICD-10-CM | POA: Diagnosis not present

## 2023-09-03 MED ORDER — AMLODIPINE BESYLATE 5 MG PO TABS: 5.0000 mg | ORAL_TABLET | Freq: Every morning | ORAL | 3 refills | Status: DC

## 2023-09-03 MED ORDER — TADALAFIL 5 MG PO TABS
5.0000 mg | ORAL_TABLET | Freq: Every evening | ORAL | 0 refills | Status: DC
Start: 2023-09-03 — End: 2023-10-15

## 2023-09-03 NOTE — Patient Instructions (Signed)
It was a pleasure meeting you today. Thank you for allowing me to take part in your health care.  Our goals for today as we discussed include:  Recent PSA <0.1 Do not need to reorder today  Schedule lab appointment for fasting blood work.  Fast for 10 hours  This is a list of the screening recommended for you and due dates:  Health Maintenance  Topic Date Due   DTaP/Tdap/Td vaccine (1 - Tdap) Never done   Zoster (Shingles) Vaccine (1 of 2) Never done   Pneumonia Vaccine (1 of 1 - PCV) Never done   Medicare Annual Wellness Visit  03/31/2023   COVID-19 Vaccine (7 - 2023-24 season) 07/29/2023   Flu Shot  02/25/2024*   Colon Cancer Screening  12/28/2028   Hepatitis C Screening  Completed   HPV Vaccine  Aged Out  *Topic was postponed. The date shown is not the original due date.    Refills sent for requested medications  Take Flonase 1 spray to each nostril morning and night  Can take amlodipine at night.  Do not take tonight.  Tomorrow, 10/08, take dose mid afternoon.  On 10/09 take Amlodipine before bed.  Follow up as needed  If you have any questions or concerns, please do not hesitate to call the office at [phone].  I look forward to our next visit and until then take care and stay safe.  Regards,   Dana Allan, MD   [facility]

## 2023-09-03 NOTE — Progress Notes (Signed)
SUBJECTIVE:   Chief Complaint  Patient presents with   Establish Care    Transferring From McLean-Scocuzza, French Ana   HPI Patient presents to transfer care  Discussed the use of AI scribe software for clinical note transcription with the patient, who gave verbal consent to proceed.  History of Present Illness The patient, previously under the care of Dr. French Ana, has transferred care to our practice. He has not seen Dr. French Ana for approximately a year. The patient is on amlodipine 5mg , which he takes in the morning, and he reports no associated fatigue. He also takes vitamin D once a week due to previously low vitamin D levels, Flonase nasal spray, melatonin 15mg  as prescribed by his pulmonologist, multivitamins, and Cialis 5mg  as prescribed by his urologist, Dr. Sheppard Penton.  The patient reports that the Flonase nasal spray sometimes helps with nasal congestion, but not consistently. He has been taking one spray in each nostril once a day, but plans to try two sprays once a day to see if it improves efficacy.  The patient has been out of Cialis and needs a refill. He was previously followed by Dr. Sheppard Penton for his prostate, but Dr. Sheppard Penton has since retired. The patient was told he did not need to see another urologist and that his primary care doctor could manage his prostate care and monitor his PSA levels.  The patient has a history of smoking two cigarettes a day for approximately 20 years, but quit three years ago. He also reports drinking about two beers a day for the past 40-50 years.  The patient was diagnosed with prostate issues approximately two years ago, but did not undergo any surgery, chemotherapy, or radiation. He has not seen a urologist for about six months.  The patient reports occasional constipation, but no diarrhea. He has no reported chest pain, shortness of breath, or swelling in his legs. He has no reported issues with urination or any blood in his urine or stool.  The patient  moved from [place] to [place] about four years ago and is now retired. He reports no significant weight loss and maintains an active lifestyle, mainly through activities at his church.    PERTINENT PMH / PSH: As above  OBJECTIVE:  BP 136/70   Pulse 74   Temp 97.7 F (36.5 C)   Resp 16   Ht 5\' 9"  (1.753 m)   Wt 151 lb 4 oz (68.6 kg)   SpO2 97%   BMI 22.34 kg/m    Physical Exam Vitals reviewed.  HENT:     Head: Normocephalic.     Right Ear: Tympanic membrane, ear canal and external ear normal.     Left Ear: Tympanic membrane, ear canal and external ear normal.     Nose: Nose normal.     Mouth/Throat:     Mouth: Mucous membranes are moist.  Eyes:     Conjunctiva/sclera: Conjunctivae normal.     Pupils: Pupils are equal, round, and reactive to light.  Neck:     Thyroid: No thyromegaly or thyroid tenderness.     Vascular: No carotid bruit.  Cardiovascular:     Rate and Rhythm: Normal rate and regular rhythm.     Pulses: Normal pulses.     Heart sounds: Normal heart sounds.  Pulmonary:     Effort: Pulmonary effort is normal.     Breath sounds: Normal breath sounds.  Abdominal:     General: Abdomen is flat. Bowel sounds are normal.  Palpations: Abdomen is soft.  Musculoskeletal:        General: Normal range of motion.     Cervical back: Normal range of motion and neck supple.     Right lower leg: No edema.     Left lower leg: No edema.  Lymphadenopathy:     Cervical: No cervical adenopathy.  Neurological:     Mental Status: He is alert.  Psychiatric:        Mood and Affect: Mood normal.        Behavior: Behavior normal.        Thought Content: Thought content normal.        Judgment: Judgment normal.        09/03/2023   10:22 AM 04/28/2022    1:16 PM 03/30/2022   12:28 PM 03/09/2021    3:11 PM 05/26/2020    2:54 PM  Depression screen PHQ 2/9  Decreased Interest 0 0 0 0 0  Down, Depressed, Hopeless 0 0 0 0 0  PHQ - 2 Score 0 0 0 0 0  Altered  sleeping 0      Tired, decreased energy 0      Change in appetite 0      Feeling bad or failure about yourself  0      Trouble concentrating 0      Moving slowly or fidgety/restless 0      Suicidal thoughts 0      PHQ-9 Score 0      Difficult doing work/chores Not difficult at all          09/03/2023   10:23 AM 05/26/2020    2:54 PM 03/18/2020    2:01 PM  GAD 7 : Generalized Anxiety Score  Nervous, Anxious, on Edge 0 0 0  Control/stop worrying 0 0 0  Worry too much - different things 0 0 0  Trouble relaxing 0 0 0  Restless 0 0 0  Easily annoyed or irritable 0 0 0  Afraid - awful might happen 0 0 0  Total GAD 7 Score 0 0 0  Anxiety Difficulty Not difficult at all Not difficult at all Not difficult at all    ASSESSMENT/PLAN:  Essential hypertension Assessment & Plan: Well controlled on Amlodipine 5mg  daily. No reported side effects. Discussed potential benefits of switching medication timing to nighttime. -Change Amlodipine administration to nighttime. -Refill Amlodipine prescription.  Orders: -     amLODIPine Besylate; Take 1 tablet (5 mg total) by mouth in the morning.  Dispense: 90 tablet; Refill: 3 -     Comprehensive metabolic panel; Future  Abnormal glucose -     Hemoglobin A1c; Future  Hyperlipidemia, unspecified hyperlipidemia type Assessment & Plan: The 10-year ASCVD risk score (Arnett DK, et al., 2019) is: 20.6%  Not currently on statin, can discuss with patient at next visit Check fasting lipids  Orders: -     Lipid panel; Future  Vitamin D deficiency Assessment & Plan: On high dose Vitamin D supplementation. Last Vitamin D level unknown. -Check Vitamin D level. -Refill Vitamin D prescription for 4 weeks until lab results are available.  Orders: -     VITAMIN D 25 Hydroxy (Vit-D Deficiency, Fractures); Future  Vitamin B 12 deficiency -     Vitamin B12; Future  Leukopenia, unspecified type -     CBC; Future  Prostate cancer [facility]) Assessment &  Plan: History of prostate cancer treated with unknown modality. Last seen by urologist approximately 6 months ago. -Check PSA  level. -Review chart for details on prostate cancer treatment and follow-up plan. -Unable to review urology note -Recommend Urology referral   Erectile dysfunction, unspecified erectile dysfunction type Assessment & Plan: Well controlled on Cialis 5mg  daily. Previously managed by Urology. -Refill Cialis prescription.  Orders: -     Tadalafil; Take 1 tablet (5 mg total) by mouth every evening.  Dispense: 10 tablet; Refill: 0  Seasonal allergies Assessment & Plan: Reports inconsistent relief with Flonase. Currently using one spray once daily. -Increase Flonase to two sprays once daily. -Consider splitting dose to one spray in the morning and one spray at night for better symptom control.       General Health Maintenance -Review need for colonoscopy based on previous results. -Discuss need for pneumonia and shingles vaccines at next visit. -Continue current smoking cessation. -Follow-up appointment in 1 year if labs are normal, sooner if abnormalities are found.  -Consider DEXA scan  -Declined vaccines   PDMP reviewed  Return if symptoms worsen or fail to improve, for PCP.  Dana Allan, MD

## 2023-09-08 ENCOUNTER — Encounter: Payer: Self-pay | Admitting: Family Medicine

## 2023-09-08 DIAGNOSIS — D72819 Decreased white blood cell count, unspecified: Secondary | ICD-10-CM | POA: Insufficient documentation

## 2023-09-08 DIAGNOSIS — N529 Male erectile dysfunction, unspecified: Secondary | ICD-10-CM | POA: Insufficient documentation

## 2023-09-08 DIAGNOSIS — R7309 Other abnormal glucose: Secondary | ICD-10-CM | POA: Insufficient documentation

## 2023-09-08 DIAGNOSIS — E538 Deficiency of other specified B group vitamins: Secondary | ICD-10-CM | POA: Insufficient documentation

## 2023-09-08 NOTE — Assessment & Plan Note (Signed)
Well controlled on Cialis 5mg  daily. Previously managed by Urology. -Refill Cialis prescription.

## 2023-09-08 NOTE — Assessment & Plan Note (Signed)
History of prostate cancer treated with unknown modality. Last seen by urologist approximately 6 months ago. -Check PSA level. -Review chart for details on prostate cancer treatment and follow-up plan. -Unable to review urology note -Recommend Urology referral

## 2023-09-08 NOTE — Assessment & Plan Note (Addendum)
On high dose Vitamin D supplementation. Last Vitamin D level unknown. -Check Vitamin D level. -Refill Vitamin D prescription for 4 weeks until lab results are available.

## 2023-09-08 NOTE — Assessment & Plan Note (Signed)
Reports inconsistent relief with Flonase. Currently using one spray once daily. -Increase Flonase to two sprays once daily. -Consider splitting dose to one spray in the morning and one spray at night for better symptom control.

## 2023-09-08 NOTE — Assessment & Plan Note (Signed)
Well controlled on Amlodipine 5mg  daily. No reported side effects. Discussed potential benefits of switching medication timing to nighttime. -Change Amlodipine administration to nighttime. -Refill Amlodipine prescription.

## 2023-09-08 NOTE — Assessment & Plan Note (Signed)
The 10-year ASCVD risk score (Arnett DK, et al., 2019) is: 20.6%  Not currently on statin, can discuss with patient at next visit Check fasting lipids

## 2023-09-26 ENCOUNTER — Other Ambulatory Visit: Payer: Self-pay | Admitting: Family Medicine

## 2023-09-26 ENCOUNTER — Other Ambulatory Visit (INDEPENDENT_AMBULATORY_CARE_PROVIDER_SITE_OTHER): Payer: Medicare HMO

## 2023-09-26 ENCOUNTER — Telehealth: Payer: Self-pay | Admitting: *Deleted

## 2023-09-26 DIAGNOSIS — R7309 Other abnormal glucose: Secondary | ICD-10-CM | POA: Diagnosis not present

## 2023-09-26 DIAGNOSIS — I10 Essential (primary) hypertension: Secondary | ICD-10-CM | POA: Diagnosis not present

## 2023-09-26 DIAGNOSIS — E559 Vitamin D deficiency, unspecified: Secondary | ICD-10-CM

## 2023-09-26 DIAGNOSIS — E785 Hyperlipidemia, unspecified: Secondary | ICD-10-CM

## 2023-09-26 DIAGNOSIS — E538 Deficiency of other specified B group vitamins: Secondary | ICD-10-CM | POA: Diagnosis not present

## 2023-09-26 DIAGNOSIS — D72819 Decreased white blood cell count, unspecified: Secondary | ICD-10-CM | POA: Diagnosis not present

## 2023-09-26 LAB — COMPREHENSIVE METABOLIC PANEL
ALT: 14 U/L (ref 0–53)
AST: 17 U/L (ref 0–37)
Albumin: 4.1 g/dL (ref 3.5–5.2)
Alkaline Phosphatase: 54 U/L (ref 39–117)
BUN: 14 mg/dL (ref 6–23)
CO2: 30 meq/L (ref 19–32)
Calcium: 9.3 mg/dL (ref 8.4–10.5)
Chloride: 103 meq/L (ref 96–112)
Creatinine, Ser: 1.33 mg/dL (ref 0.40–1.50)
GFR: 54.07 mL/min — ABNORMAL LOW (ref >60.00)
Glucose, Bld: 108 mg/dL — ABNORMAL HIGH (ref 70–99)
Potassium: 4.3 meq/L (ref 3.5–5.1)
Sodium: 141 meq/L (ref 135–145)
Total Bilirubin: 0.6 mg/dL (ref 0.2–1.2)
Total Protein: 7.2 g/dL (ref 6.0–8.3)

## 2023-09-26 LAB — CBC
HCT: 49.6 % (ref 39.0–52.0)
Hemoglobin: 16.4 g/dL (ref 13.0–17.0)
MCHC: 33.1 g/dL (ref 30.0–36.0)
MCV: 91.6 fL (ref 78.0–100.0)
Platelets: 223 10*3/uL (ref 150.0–400.0)
RBC: 5.41 Mil/uL (ref 4.22–5.81)
RDW: 13.3 % (ref 11.5–15.5)
WBC: 5.4 10*3/uL (ref 4.0–10.5)

## 2023-09-26 LAB — VITAMIN B12: Vitamin B-12: 261 pg/mL (ref 211–911)

## 2023-09-26 LAB — LIPID PANEL
Cholesterol: 214 mg/dL — ABNORMAL HIGH (ref 0–200)
HDL: 80.7 mg/dL (ref >39.00)
LDL Cholesterol: 117 mg/dL — ABNORMAL HIGH (ref 0–99)
NonHDL: 133.36
Total CHOL/HDL Ratio: 3
Triglycerides: 84 mg/dL (ref 0.0–149.0)
VLDL: 16.8 mg/dL (ref 0.0–40.0)

## 2023-09-26 LAB — VITAMIN D 25 HYDROXY (VIT D DEFICIENCY, FRACTURES): VITD: 101.33 ng/mL (ref 30.00–100.00)

## 2023-09-26 LAB — HEMOGLOBIN A1C: Hgb A1c MFr Bld: 6 % (ref 4.6–6.5)

## 2023-09-26 NOTE — Telephone Encounter (Signed)
Please call patient.  Vitamin D too high  stop all supplements containing vitamin D. Follup with PCP [employer]

## 2023-09-26 NOTE — Telephone Encounter (Signed)
Dr Clent Ridges "out of office"

## 2023-09-26 NOTE — Telephone Encounter (Signed)
CRITICAL VALUE STICKER  CRITICAL VALUE: Vit D-101.33  RECEIVER (on-site recipient of call): Silvestre Moment, CMA  DATE & TIME NOTIFIED: 09/26/23 @ 4pm  MESSENGER (representative from lab): Allene Pyo  MD NOTIFIED: Dr. Clent Ridges  TIME OF NOTIFICATION: 4:00pm  RESPONSE:  see results

## 2023-09-26 NOTE — Telephone Encounter (Signed)
Spoke with pt and advised him to stop all vitamin d supplements. Pt also stated that he is not having any symptoms.

## 2023-09-26 NOTE — Telephone Encounter (Signed)
Left detailed voicemail per DPR.

## 2023-09-30 ENCOUNTER — Encounter: Payer: Self-pay | Admitting: Family Medicine

## 2023-10-15 ENCOUNTER — Other Ambulatory Visit: Payer: Self-pay

## 2023-10-15 ENCOUNTER — Telehealth: Payer: Self-pay | Admitting: Family Medicine

## 2023-10-15 DIAGNOSIS — N529 Male erectile dysfunction, unspecified: Secondary | ICD-10-CM

## 2023-10-15 MED ORDER — TADALAFIL 5 MG PO TABS: 5.0000 mg | ORAL_TABLET | Freq: Every evening | ORAL | 0 refills | Status: DC

## 2023-10-15 NOTE — Telephone Encounter (Signed)
Prescription Request  10/15/2023  LOV: 09/03/2023  What is the name of the medication or equipment? tadalafil 90 day supply  Have you contacted your pharmacy to request a refill? No   Which pharmacy would you like this sent to? [facility] [address]   Patient notified that their request is being sent to the clinical staff for review and that they should receive a response within 2 business days.   Please advise at Mobile [phone] (mobile)

## 2023-10-15 NOTE — Telephone Encounter (Signed)
Refill request sent to provider.

## 2023-10-16 ENCOUNTER — Encounter: Payer: Self-pay | Admitting: Family Medicine

## 2023-10-16 ENCOUNTER — Other Ambulatory Visit: Payer: Self-pay | Admitting: Family Medicine

## 2023-10-16 DIAGNOSIS — N529 Male erectile dysfunction, unspecified: Secondary | ICD-10-CM

## 2023-10-16 MED ORDER — TADALAFIL 5 MG PO TABS: 5.0000 mg | ORAL_TABLET | Freq: Every evening | ORAL | 3 refills | Status: DC

## 2023-11-23 ENCOUNTER — Other Ambulatory Visit: Payer: Self-pay | Admitting: Family Medicine

## 2023-11-27 ENCOUNTER — Telehealth: Payer: Self-pay | Admitting: Family Medicine

## 2023-11-27 NOTE — Telephone Encounter (Signed)
 Copied from CRM [id number]. Topic: Medicare AWV >> Nov 27, 2023 11:30 AM Nathanel DEL wrote: Reason for CRM: Called LVM 11/27/2023 to schedule AWV. Please schedule office or virtual visits.  Nathanel Paschal; Care Guide Ambulatory Clinical Support [address] [facility] Direct Dial: [phone]

## 2023-12-05 ENCOUNTER — Encounter: Payer: Self-pay | Admitting: Family Medicine

## 2023-12-05 NOTE — Telephone Encounter (Signed)
 Called Patient to schedule a office visit for the cough and he declined the visit and said the cough is not that bad.

## 2023-12-25 ENCOUNTER — Other Ambulatory Visit (HOSPITAL_COMMUNITY): Payer: Self-pay

## 2023-12-25 ENCOUNTER — Telehealth: Payer: Self-pay

## 2023-12-25 NOTE — Telephone Encounter (Signed)
Pharmacy Patient Advocate Encounter   Received notification from  [facility] Portal that prior authorization for Tadalafil 5MG  tablets is required/requested.   Insurance verification completed.   The patient is insured through [employer] [facility] [employer] .   Per test claim: PA required; PA submitted to above mentioned insurance via CoverMyMeds Key/confirmation #/EOC [facility] Status is pending

## 2023-12-26 ENCOUNTER — Telehealth: Payer: Self-pay | Admitting: Family Medicine

## 2023-12-26 ENCOUNTER — Encounter: Payer: Self-pay | Admitting: Family Medicine

## 2023-12-26 NOTE — Telephone Encounter (Signed)
Pharmacy Patient Advocate Encounter  Received notification from [employer] [facility] Medicarethat Prior Authorization for Tadalafil 5MG  tablets has been DENIED.  See denial reason below. No denial letter attached in CMM. Will attach denial letter to Media tab once received.   PA #/Case ID/Reference #: [id number]

## 2023-12-26 NOTE — Telephone Encounter (Signed)
Fyi  Noted. Pt notified via mychart

## 2023-12-26 NOTE — Telephone Encounter (Signed)
Patient called to reschedule appt / provider sick. Patient can be reschedule with another provider at office, if he wants.

## 2023-12-28 ENCOUNTER — Ambulatory Visit: Payer: Medicare HMO | Admitting: Family Medicine

## 2024-01-03 ENCOUNTER — Ambulatory Visit
Admission: EM | Admit: 2024-01-03 | Discharge: 2024-01-03 | Disposition: A | Discharge status: Home / Self Care | Payer: Medicare HMO | Attending: Emergency Medicine | Admitting: Emergency Medicine

## 2024-01-03 ENCOUNTER — Encounter: Payer: Self-pay | Admitting: Emergency Medicine

## 2024-01-03 DIAGNOSIS — R509 Fever, unspecified: Secondary | ICD-10-CM

## 2024-01-03 DIAGNOSIS — J069 Acute upper respiratory infection, unspecified: Secondary | ICD-10-CM | POA: Diagnosis not present

## 2024-01-03 MED ORDER — AMOXICILLIN-POT CLAVULANATE 875-125 MG PO TABS: 1.0000 | ORAL_TABLET | Freq: Two times a day (BID) | ORAL | 0 refills | Status: AC

## 2024-01-03 MED ORDER — BENZONATATE 100 MG PO CAPS: 100.0000 mg | ORAL_CAPSULE | Freq: Three times a day (TID) | ORAL | 0 refills | Status: DC

## 2024-01-03 NOTE — ED Triage Notes (Signed)
 Pt presents with a productive cough, chest congestion and scratchy throat x 1 month. Pt has taken Mucinex for the cough.

## 2024-01-03 NOTE — Discharge Instructions (Signed)
 Today you are evaluated for cough and congestion, on exam lungs are lungs are clear and you are getting enough air  therefore chest x-ray has been held off  Begin Augmentin  every morning and every evening for 10 days to clear any bacteria of airway causing symptoms to linger  You may use Tessalon  pill every 8 hours as needed to help calm coughing in addition to over-the-counter Mucinex You can take Tylenol  and/or Ibuprofen as needed for fever reduction and pain relief.   For cough: honey 1/2 to 1 teaspoon (you can dilute the honey in water or another fluid).  You can also use guaifenesin and dextromethorphan for cough. You can use a humidifier for chest congestion and cough.  If you don't have a humidifier, you can sit in the bathroom with the hot shower running.      For sore throat: try warm salt water gargles, cepacol lozenges, throat spray, warm tea or water with lemon/honey, popsicles or ice, or OTC cold relief medicine for throat discomfort.   For congestion: take a daily anti-histamine like Zyrtec, Claritin, and a oral decongestant, such as pseudoephedrine.  You can also use Flonase  1-2 sprays in each nostril daily.   It is important to stay hydrated: drink plenty of fluids (water, gatorade/powerade/pedialyte, juices, or teas) to keep your throat moisturized and help further relieve irritation/discomfort.

## 2024-01-03 NOTE — ED Provider Notes (Signed)
 David Church    CSN: [id number] Arrival date & time: 01/03/24  1202      History   Chief Complaint Chief Complaint  Patient presents with   Cough    HPI David Church is a 73 y.o. male.   Patient presents for evaluation of nasal congestion, rhinorrhea, nonproductive cough and a scratchy throat present prior.  Has attempted use of Mucinex DM.  Denies shortness of breath or wheezing.  Denies respiratory history, non-smoker.   Past Medical History:  Diagnosis Date   Allergy    Closed compression fracture of L5 vertebra (HCC)    COVID-19 08/24/2021   molnupiravir  x bid x 5 days   DDD (degenerative disc disease), lumbar    Granulomatous lung disease (HCC)    Hemangioma    Hiatal hernia    Hypertension    Liver cyst    OSA on CPAP    Parasomnia    Prediabetes    6.1 10/23/18   Prostate cancer (HCC)    REM sleep behavior disorder     Patient Active Problem List   Diagnosis Date Noted   Leukopenia 09/08/2023   Vitamin B 12 deficiency 09/08/2023   Abnormal glucose 09/08/2023   Erectile dysfunction 09/08/2023   REM sleep behavior disorder 09/26/2022   Prostate cancer (HCC) 04/13/2022   Vitamin D  deficiency 11/17/2021   Elevated PSA 11/17/2021   Liver cyst 11/01/2021   Prediabetes 11/01/2021   Abnormal MRI, cervical spine 11/23/2020   History of granulomatous disease 11/23/2020   Annual physical exam 11/18/2020   Seasonal allergies 11/18/2020   Smoker 10/26/2020   Healthcare maintenance 10/26/2020   OSA on CPAP 10/25/2020   Parasomnia 06/30/2020   Essential hypertension 03/18/2020   Hyperlipidemia 01/13/2020    Past Surgical History:  Procedure Laterality Date   AXILLARY LYMPH NODE BIOPSY Right 09/18/2019   Procedure: AXILLARY LYMPH NODE BIOPSY;  Surgeon: Jordis Laneta FALCON, MD;  Location: [facility] ORS;  Service: General;  Laterality: Right;   CARDIAC CATHETERIZATION     HERNIA REPAIR     15 years ago-right inguinal    HIGH INTENSITY FOCUSED ULTRASOUND  (HIFU) OF THE PROSTATE N/A 04/13/2022   Procedure: HIGH INTENSITY FOCUSED ULTRASOUND (HIFU) OF THE PROSTATE;  Surgeon: Kassie Ozell SAUNDERS, MD;  Location: [facility] ORS;  Service: Urology;  Laterality: N/A;   left cataract surgery Left    fall 2022   PROSTATE BIOPSY N/A 02/23/2022   Procedure: PROSTATE BIOPSY GRAYCE;  Surgeon: Kassie Ozell SAUNDERS, MD;  Location: [facility] ORS;  Service: Urology;  Laterality: N/A;       Home Medications    Prior to Admission medications   Medication Sig Start Date End Date Taking? Authorizing Provider  amoxicillin -clavulanate (AUGMENTIN ) 875-125 MG tablet Take 1 tablet by mouth every 12 (twelve) hours for 10 days. 01/03/24 01/13/24 Yes Abbrielle Batts R, NP  benzonatate  (TESSALON ) 100 MG capsule Take 1 capsule (100 mg total) by mouth every 8 (eight) hours. 01/03/24  Yes Mohid Furuya R, NP  amLODipine  (NORVASC ) 5 MG tablet Take 1 tablet (5 mg total) by mouth in the morning. 09/03/23   Hope Merle, MD  fluticasone  (FLONASE ) 50 MCG/ACT nasal spray PLACE 1-2 SPRAYS INTO BOTH NOSTRILS DAILY AS NEEDED. 11/25/23   Hope Merle, MD  melatonin 5 MG TABS Take 1 tablet (5 mg total) by mouth at bedtime. 08/04/22   Sood, Vineet, MD  Multiple Vitamin (MULTIVITAMIN) tablet Take 1 tablet by mouth daily. One A Day    [provider]  tadalafil  (CIALIS ) 5 MG tablet Take 1 tablet (5 mg total) by mouth every evening. 10/16/23   Hope Merle, MD    Family History Family History  Problem Relation Age of Onset   Cancer Mother 86   Diabetes Mother    Hypertension Mother    Alzheimer's disease Father    Heart attack Father    Stomach cancer Maternal Aunt    Lung cancer Maternal Uncle     Social History Social History   Tobacco Use   Smoking status: Former    Current packs/day: 0.00    Average packs/day: 0.1 packs/day for 49.0 years (4.9 ttl pk-yrs)    Types: Cigarettes    Start date: 11/1971    Quit date: 11/2020    Years since quitting: 3.1   Smokeless tobacco: Never   Vaping Use   Vaping status: Never Used  Substance Use Topics   Alcohol use: Yes    Alcohol/week: 2.0 standard drinks of alcohol    Types: 2 Cans of beer per week    Comment: 2 beers a day and wine on special occasion   Drug use: Not Currently    Types: Marijuana, Cocaine    Comment: did drugs but quit in early 30's     Allergies   Patient has no known allergies.   Review of Systems Review of Systems   Physical Exam Triage Vital Signs ED Triage Vitals [01/03/24 1223]  Encounter Vitals Group     BP (!) 144/84     Systolic BP Percentile      Diastolic BP Percentile      Pulse Rate 93     Resp 18     Temp (!) 100.5 F (38.1 C)     Temp Source Oral     SpO2 96 %     Weight      Height      Head Circumference      Peak Flow      Pain Score      Pain Loc      Pain Education      Exclude from Growth Chart    No data found.  Updated Vital Signs BP (!) 144/84 (BP Location: Left Arm)   Pulse 93   Temp (!) 100.5 F (38.1 C) (Oral)   Resp 18   SpO2 96%   Visual Acuity Right Eye Distance:   Left Eye Distance:   Bilateral Distance:    Right Eye Near:   Left Eye Near:    Bilateral Near:     Physical Exam Constitutional:      Appearance: Normal appearance.  HENT:     Right Ear: Tympanic membrane, ear canal and external ear normal.     Left Ear: Tympanic membrane, ear canal and external ear normal.     Nose: Congestion present. No rhinorrhea.     Mouth/Throat:     Pharynx: Posterior oropharyngeal erythema present. No oropharyngeal exudate.  Eyes:     Extraocular Movements: Extraocular movements intact.  Cardiovascular:     Rate and Rhythm: Normal rate and regular rhythm.     Pulses: Normal pulses.     Heart sounds: Normal heart sounds.  Pulmonary:     Effort: Pulmonary effort is normal.     Breath sounds: Normal breath sounds.  Neurological:     Mental Status: He is alert and oriented to person, place, and time. Mental status is at baseline.       UC Treatments / Results  Labs (all labs ordered are listed, but only abnormal results are displayed) Labs Reviewed - No data to display  EKG   Radiology No results found.  Procedures Procedures (including critical care time)  Medications Ordered in UC Medications - No data to display  Initial Impression / Assessment and Plan / UC Course  I have reviewed the triage vital signs and the nursing notes.  Pertinent labs & imaging results that were available during my care of the patient were reviewed by me and considered in my medical decision making (see chart for details).  Acute URI  Fever of 100.5 noted in triage, patient is in no signs of distress nontoxic-appearing, no signs of sepsis, symptoms present for 1 month most likely bacterial, his lungs are clear to auscultation deferred x-ray imaging at this time, prescribed Augmentin  and Tessalon , recommended additional supportive measures with follow-up with urgent care as needed Final Clinical Impressions(s) / UC Diagnoses   Final diagnoses:  Acute URI     Discharge Instructions      Today you are evaluated for cough and congestion, on exam lungs are lungs are clear and you are getting enough air  therefore chest x-ray has been held off  Begin Augmentin  every morning and every evening for 10 days to clear any bacteria of airway causing symptoms to linger  You may use Tessalon  pill every 8 hours as needed to help calm coughing in addition to over-the-counter Mucinex You can take Tylenol  and/or Ibuprofen as needed for fever reduction and pain relief.   For cough: honey 1/2 to 1 teaspoon (you can dilute the honey in water or another fluid).  You can also use guaifenesin and dextromethorphan for cough. You can use a humidifier for chest congestion and cough.  If you don't have a humidifier, you can sit in the bathroom with the hot shower running.      For sore throat: try warm salt water gargles, cepacol lozenges,  throat spray, warm tea or water with lemon/honey, popsicles or ice, or OTC cold relief medicine for throat discomfort.   For congestion: take a daily anti-histamine like Zyrtec, Claritin, and a oral decongestant, such as pseudoephedrine.  You can also use Flonase  1-2 sprays in each nostril daily.   It is important to stay hydrated: drink plenty of fluids (water, gatorade/powerade/pedialyte, juices, or teas) to keep your throat moisturized and help further relieve irritation/discomfort.    ED Prescriptions     Medication Sig Dispense Auth. Provider   amoxicillin -clavulanate (AUGMENTIN ) 875-125 MG tablet Take 1 tablet by mouth every 12 (twelve) hours for 10 days. 20 tablet Shiryl Ruddy R, NP   benzonatate  (TESSALON ) 100 MG capsule Take 1 capsule (100 mg total) by mouth every 8 (eight) hours. 21 capsule Eola Waldrep, Shelba SAUNDERS, NP      PDMP not reviewed this encounter.   Teresa Shelba SAUNDERS, NP 01/03/24 1306

## 2024-01-10 ENCOUNTER — Ambulatory Visit: Payer: Medicare HMO | Admitting: Internal Medicine

## 2024-01-10 ENCOUNTER — Encounter: Payer: Self-pay | Admitting: Internal Medicine

## 2024-01-10 VITALS — BP 102/68 | HR 71 | Temp 98.0°F | Resp 16 | Ht 69.0 in | Wt 155.0 lb

## 2024-01-10 DIAGNOSIS — G4733 Obstructive sleep apnea (adult) (pediatric): Secondary | ICD-10-CM

## 2024-01-10 NOTE — Progress Notes (Signed)
[facility]   Past Medical History:  Allergies, Pre-diabetes, COVID 15 August 2021, Hiatal hernia, HTN, Pre-DM  Past Surgical History:  His  has a past surgical history that includes Hernia repair; Axillary lymph node biopsy (Right, 09/18/2019); left cataract surgery (Left); Cardiac catheterization; Prostate biopsy (N/A, 02/23/2022); and High intensity focused ultrasound (hifu) of the prostrate (N/A, 04/13/2022).  Brief Summary:  David Church is a 73 y.o. male former smoker with obstructive sleep apnea and dream enactment.     CC Follow-up assessment for OSA   Subjective:  Patient states he is getting his CPAP every day His machine is unable to produce a download however he will need to go to the DME company to obtain a download and fax to [place]  I have reviewed previous downloads in the past which showed 100% compliance Patient states he uses it every day   No evidence of heart failure at this time No evidence or signs of infection at this time No respiratory distress No fevers, chills, nausea, vomiting, diarrhea No evidence of lower extremity edema No evidence hemoptysis    Uses CPAP nightly.  No issues with mask fit.  Not drinking alcohol.  No dream enactment as long as he avoids red meat.  Using melatonin at night.  Gets about 7 to 8 hrs sleep.  Naps in the afternoon sometimes for about 30 minutes.    Physical Exam:  BP 102/68   Pulse 71   Temp 98 F (36.7 C) (Oral)   Resp 16   Ht 5\' 9"  (1.753 m)   Wt 155 lb (70.3 kg)   SpO2 98%   BMI 22.89 kg/m      Review of Systems: Gen:  Denies  fever, sweats, chills weight loss  HEENT: Denies blurred vision, double vision, ear pain, eye pain, hearing loss, nose bleeds, sore throat Cardiac:  No dizziness, chest pain or heaviness, chest tightness,edema, No JVD Resp:   No cough, -sputum production, -shortness of breath,-wheezing, -hemoptysis,  Other:  All other systems  negative   Physical Examination:   General Appearance: No distress  EYES PERRLA, EOM intact.   NECK Supple, No JVD Pulmonary: normal breath sounds, No wheezing.  CardiovascularNormal S1,S2.  No m/r/g.   Abdomen: Benign, Soft, non-tender. Neurology UE/LE 5/5 strength, no focal deficits Ext pulses intact, cap refill intact ALL OTHER ROS ARE NEGATIVE    Sleep Tests:  HST 10/13/20 >> AHI 27.8, SpO2 low 90% Auto CPAP 12/08/20 to 12/23/20 >> used on 16 of 16 nights with average 9 hours 43 min.  Average AHI 5 with mean CPAP 8 and 95 th percentile CPAP 9 cm H2O. CPAP titration 06/28/22 >> CPAP 10 cm H2O, increased REM tone  Social History:  He  reports that he quit smoking about 3 years ago. His smoking use included cigarettes. He started smoking about 52 years ago. He has a 4.9 pack-year smoking history. He has never used smokeless tobacco. He reports current alcohol use of about 2.0 standard drinks of alcohol per week. He reports that he does not currently use drugs after having used the following drugs: Marijuana and Cocaine.  Family History:  His family history includes Alzheimer's disease in his father; Cancer (age of onset: 33) in his mother; Diabetes in his mother; Heart attack in his father; Hypertension in his mother; Lung cancer in his maternal uncle; Stomach cancer in his maternal aunt.     Plan:  73 year old pleasant African-American male seen today  for moderate sleep apnea with AHI of 28   Assessment of Sleep apnea Continue current prescription Auto CPAP 5-15  Patient Instructions Continue to use CPAP every night, minimum of 4-6 hours a night.  Change equipment every 30 days or as directed by DME.  Wash your tubing with warm soap and water daily, hang to dry. Wash humidifier portion weekly. Use bottled, distilled water and change daily   Be aware of reduced alertness and do not drive or operate heavy machinery if experiencing this or drowsiness.  Exercise encouraged, as  tolerated. Encouraged proper weight management.  Important to get eight or more hours of sleep  Limiting the use of the computer and television before bedtime.  Decrease naps during the day, so night time sleep will become enhanced.  Limit caffeine, and sleep deprivation.  HTN, stroke, uncontrolled diabetes and heart failure are potential risk factors.  Risk of untreated sleep apnea including cardiac arrhthymias, stroke, DM, pulm HTN.    Parasomnias. - from REM sleep behavior disorder, and NREM parasomnia - continue melatonin - avoid red meat (self reported trigger) and alcohol   Medication List:   Allergies as of 01/10/2024   No Known Allergies      Medication List        Accurate as of January 10, 2024  7:56 AM. If you have any questions, ask your nurse or doctor.          amLODipine 5 MG tablet Commonly known as: Norvasc Take 1 tablet (5 mg total) by mouth in the morning.   amoxicillin-clavulanate 875-125 MG tablet Commonly known as: AUGMENTIN Take 1 tablet by mouth every 12 (twelve) hours for 10 days.   benzonatate 100 MG capsule Commonly known as: TESSALON Take 1 capsule (100 mg total) by mouth every 8 (eight) hours.   fluticasone 50 MCG/ACT nasal spray Commonly known as: FLONASE PLACE 1-2 SPRAYS INTO BOTH NOSTRILS DAILY AS NEEDED.   melatonin 5 MG Tabs Take 1 tablet (5 mg total) by mouth at bedtime.   multivitamin tablet Take 1 tablet by mouth daily. One A Day   tadalafil 5 MG tablet Commonly known as: CIALIS Take 1 tablet (5 mg total) by mouth every evening.         MEDICATION ADJUSTMENTS/LABS AND TESTS ORDERED: Continue CPAP as prescribed Please fax or send reports of download to [place] for further assessment  Avoid Allergens and Irritants Avoid secondhand smoke Avoid SICK contacts Recommend  Masking  when appropriate Recommend Keep up-to-date with vaccinations    CURRENT MEDICATIONS REVIEWED AT LENGTH WITH PATIENT TODAY   Patient   satisfied with Plan of action and management. All questions answered   Follow up 6 months   I spent a total of 42 minutes reviewing chart data, face-to-face evaluation with the patient, counseling and coordination of care as detailed above.      Lucie Leather, M.D.  [facility]  Medical Director [facility] Medical Director [facility] Cardio-Pulmonary Department

## 2024-01-10 NOTE — Patient Instructions (Addendum)
   Continue CPAP as prescribed  Please fax or send reports of download to [place] for further assessment   Avoid Allergens and Irritants Avoid secondhand smoke Avoid SICK contacts Recommend  Masking  when appropriate Recommend Keep up-to-date with vaccinations   Be aware of reduced alertness and do not drive or operate heavy machinery if experiencing this or drowsiness.  Exercise encouraged, as tolerated. Encouraged proper weight management.  Important to get eight or more hours of sleep  Limiting the use of the computer and television before bedtime.  Decrease naps during the day, so night time sleep will become enhanced.  Limit caffeine, and sleep deprivation.

## 2024-02-20 ENCOUNTER — Other Ambulatory Visit: Payer: Self-pay | Admitting: Family Medicine

## 2024-02-20 DIAGNOSIS — N529 Male erectile dysfunction, unspecified: Secondary | ICD-10-CM

## 2024-02-27 ENCOUNTER — Other Ambulatory Visit (HOSPITAL_COMMUNITY): Payer: Self-pay

## 2024-03-25 ENCOUNTER — Encounter: Payer: Self-pay | Admitting: Family Medicine

## 2024-03-25 ENCOUNTER — Ambulatory Visit: Admitting: Family Medicine

## 2024-03-25 VITALS — BP 134/68 | HR 80 | Temp 97.9°F | Resp 20 | Ht 69.0 in | Wt 148.1 lb

## 2024-03-25 DIAGNOSIS — E538 Deficiency of other specified B group vitamins: Secondary | ICD-10-CM

## 2024-03-25 DIAGNOSIS — C61 Malignant neoplasm of prostate: Secondary | ICD-10-CM

## 2024-03-25 DIAGNOSIS — E559 Vitamin D deficiency, unspecified: Secondary | ICD-10-CM

## 2024-03-25 DIAGNOSIS — J40 Bronchitis, not specified as acute or chronic: Secondary | ICD-10-CM

## 2024-03-25 DIAGNOSIS — I10 Essential (primary) hypertension: Secondary | ICD-10-CM | POA: Diagnosis not present

## 2024-03-25 DIAGNOSIS — E785 Hyperlipidemia, unspecified: Secondary | ICD-10-CM | POA: Diagnosis not present

## 2024-03-25 DIAGNOSIS — J310 Chronic rhinitis: Secondary | ICD-10-CM | POA: Diagnosis not present

## 2024-03-25 DIAGNOSIS — R059 Cough, unspecified: Secondary | ICD-10-CM | POA: Diagnosis not present

## 2024-03-25 DIAGNOSIS — Z0001 Encounter for general adult medical examination with abnormal findings: Secondary | ICD-10-CM | POA: Diagnosis not present

## 2024-03-25 DIAGNOSIS — Z0000 Encounter for general adult medical examination without abnormal findings: Secondary | ICD-10-CM

## 2024-03-25 LAB — COMPREHENSIVE METABOLIC PANEL WITH GFR
ALT: 12 U/L (ref 0–53)
AST: 19 U/L (ref 0–37)
Albumin: 4.3 g/dL (ref 3.5–5.2)
Alkaline Phosphatase: 53 U/L (ref 39–117)
BUN: 17 mg/dL (ref 6–23)
CO2: 28 meq/L (ref 19–32)
Calcium: 9.2 mg/dL (ref 8.4–10.5)
Chloride: 103 meq/L (ref 96–112)
Creatinine, Ser: 1.25 mg/dL (ref 0.40–1.50)
GFR: 58.05 mL/min — ABNORMAL LOW (ref >60.00)
Glucose, Bld: 100 mg/dL — ABNORMAL HIGH (ref 70–99)
Potassium: 4.1 meq/L (ref 3.5–5.1)
Sodium: 140 meq/L (ref 135–145)
Total Bilirubin: 0.7 mg/dL (ref 0.2–1.2)
Total Protein: 7.5 g/dL (ref 6.0–8.3)

## 2024-03-25 LAB — CBC WITH DIFFERENTIAL/PLATELET
Basophils Absolute: 0 10*3/uL (ref 0.0–0.1)
Basophils Relative: 0.7 % (ref 0.0–3.0)
Eosinophils Absolute: 0 10*3/uL (ref 0.0–0.7)
Eosinophils Relative: 0.7 % (ref 0.0–5.0)
HCT: 45.3 % (ref 39.0–52.0)
Hemoglobin: 15.1 g/dL (ref 13.0–17.0)
Lymphocytes Relative: 34.2 % (ref 12.0–46.0)
Lymphs Abs: 1.7 10*3/uL (ref 0.7–4.0)
MCHC: 33.4 g/dL (ref 30.0–36.0)
MCV: 91.6 fl (ref 78.0–100.0)
Monocytes Absolute: 0.5 10*3/uL (ref 0.1–1.0)
Monocytes Relative: 10.6 % (ref 3.0–12.0)
Neutro Abs: 2.6 10*3/uL (ref 1.4–7.7)
Neutrophils Relative %: 53.8 % (ref 43.0–77.0)
Platelets: 252 10*3/uL (ref 150.0–400.0)
RBC: 4.95 Mil/uL (ref 4.22–5.81)
RDW: 13.6 % (ref 11.5–15.5)
WBC: 4.9 10*3/uL (ref 4.0–10.5)

## 2024-03-25 LAB — LIPID PANEL
Cholesterol: 228 mg/dL — ABNORMAL HIGH (ref 0–200)
HDL: 77.5 mg/dL (ref >39.00)
LDL Cholesterol: 135 mg/dL — ABNORMAL HIGH (ref 0–99)
NonHDL: 150.52
Total CHOL/HDL Ratio: 3
Triglycerides: 76 mg/dL (ref 0.0–149.0)
VLDL: 15.2 mg/dL (ref 0.0–40.0)

## 2024-03-25 LAB — VITAMIN B12: Vitamin B-12: 239 pg/mL (ref 211–911)

## 2024-03-25 LAB — PSA, MEDICARE: PSA: 0.01 ng/mL — ABNORMAL LOW (ref 0.10–4.00)

## 2024-03-25 LAB — VITAMIN D 25 HYDROXY (VIT D DEFICIENCY, FRACTURES): VITD: 55.83 ng/mL (ref 30.00–100.00)

## 2024-03-25 MED ORDER — ALBUTEROL SULFATE HFA 108 (90 BASE) MCG/ACT IN AERS: 2.0000 | INHALATION_SPRAY | Freq: Four times a day (QID) | RESPIRATORY_TRACT | 0 refills | Status: DC | PRN

## 2024-03-25 MED ORDER — AZELASTINE HCL 0.1 % NA SOLN: 2.0000 | Freq: Two times a day (BID) | NASAL | 12 refills | Status: DC

## 2024-03-25 MED ORDER — LEVOCETIRIZINE DIHYDROCHLORIDE 5 MG PO TABS: 5.0000 mg | ORAL_TABLET | Freq: Every evening | ORAL | 11 refills | Status: DC

## 2024-03-25 MED ORDER — AMLODIPINE BESYLATE 5 MG PO TABS
5.0000 mg | ORAL_TABLET | Freq: Every morning | ORAL | 3 refills | Status: DC
Start: 2024-03-25 — End: 2024-06-13

## 2024-03-25 MED ORDER — FLUTICASONE PROPIONATE 50 MCG/ACT NA SUSP: 2.0000 | Freq: Two times a day (BID) | NASAL | 6 refills | Status: DC

## 2024-03-25 NOTE — Progress Notes (Signed)
 SUBJECTIVE:   Chief Complaint  Patient presents with   Annual Exam   HPI Presents for annual physical  Discussed the use of AI scribe software for clinical note transcription with the patient, who gave verbal consent to proceed.  History of Present Illness David Church is a 73 year old male who presents for an annual physical exam.  He is fasting for blood work, which will include cholesterol, kidney function, liver function, and vitamin levels. He mentions that his vitamin D  was high previously. He has no specific concerns at this time.  He has a history of high PSA levels and underwent prostate surgery approximately two years ago. He has not had his PSA checked recently as his urologist retired, and his primary care doctor is supposed to monitor it every six months.  He is currently managing his blood pressure with medication and does not require a refill at this time. He also uses Flonase  for his sinuses, taking two sprays twice a day, and melatonin to aid sleep. He does not need refills for Cialis  or Flonase .  He experiences sinus issues, including a runny nose, postnasal drip, and a cough that worsens when lying down. He describes a sensation of 'little bugs' in his head, which he attributes to his sinuses. No fever, chest pain, or wheezing. He has a history of smoking but quit. He has not used an inhaler before.  He is not interested in receiving the tetanus, pneumonia, or shingles vaccines due to a dislike of needles.    Review of Systems - Negative except above listed    PERTINENT PMH / PSH: As above  OBJECTIVE:  BP 134/68   Pulse 80   Temp 97.9 F (36.6 C)   Resp 20   Ht 5\' 9"  (1.753 m)   Wt 148 lb 2 oz (67.2 kg)   SpO2 99%   BMI 21.87 kg/m    Physical Exam Vitals reviewed.  HENT:     Head: Normocephalic.     Right Ear: Tympanic membrane, ear canal and external ear normal.     Left Ear: Tympanic membrane, ear canal and external ear normal.     Nose: Nose  normal.     Mouth/Throat:     Mouth: Mucous membranes are moist.  Eyes:     Conjunctiva/sclera: Conjunctivae normal.     Pupils: Pupils are equal, round, and reactive to light.  Neck:     Thyroid : No thyromegaly or thyroid  tenderness.     Vascular: No carotid bruit.  Cardiovascular:     Rate and Rhythm: Normal rate and regular rhythm.     Pulses: Normal pulses.     Heart sounds: Normal heart sounds.  Pulmonary:     Effort: Pulmonary effort is normal.     Breath sounds: Rhonchi present.  Abdominal:     General: Abdomen is flat. Bowel sounds are normal.     Palpations: Abdomen is soft.  Musculoskeletal:        General: Normal range of motion.     Cervical back: Normal range of motion and neck supple.     Right lower leg: No edema.     Left lower leg: No edema.  Lymphadenopathy:     Cervical: No cervical adenopathy.  Neurological:     Mental Status: He is alert.  Psychiatric:        Mood and Affect: Mood normal.        Behavior: Behavior normal.  Thought Content: Thought content normal.        Judgment: Judgment normal.           03/25/2024    8:48 AM 09/03/2023   10:22 AM 04/28/2022    1:16 PM 03/30/2022   12:28 PM 03/09/2021    3:11 PM  Depression screen PHQ 2/9  Decreased Interest 0 0 0 0 0  Down, Depressed, Hopeless 0 0 0 0 0  PHQ - 2 Score 0 0 0 0 0  Altered sleeping 0 0     Tired, decreased energy 0 0     Change in appetite 0 0     Feeling bad or failure about yourself  0 0     Trouble concentrating 0 0     Moving slowly or fidgety/restless 0 0     Suicidal thoughts 0 0     PHQ-9 Score 0 0     Difficult doing work/chores Not difficult at all Not difficult at all         03/25/2024    8:48 AM 09/03/2023   10:23 AM 05/26/2020    2:54 PM 03/18/2020    2:01 PM  GAD 7 : Generalized Anxiety Score  Nervous, Anxious, on Edge  0 0 0  Control/stop worrying 0 0 0 0  Worry too much - different things 0 0 0 0  Trouble relaxing 0 0 0 0  Restless 0 0 0 0  Easily  annoyed or irritable 0 0 0 0  Afraid - awful might happen 0 0 0 0  Total GAD 7 Score  0 0 0  Anxiety Difficulty Not difficult at all Not difficult at all Not difficult at all Not difficult at all    ASSESSMENT/PLAN:  Annual physical exam Assessment & Plan: Routine wellness visit. Blood work to include cholesterol, renal function, hepatic function, and vitamin D  levels. Previous elevated PSA noted. Colonoscopy not due until 2030. Discussed vaccines, declined due to needle aversion. - Order blood work including cholesterol, renal function, hepatic function, and vitamin D  levels. - Check PSA levels. History of prostate cancer - Discuss option of obtaining vaccines at a pharmacy if desired.   Essential hypertension Assessment & Plan: Well controlled on Amlodipine  5mg  daily. No reported side effects. Discussed potential benefits of switching medication timing to nighttime. -Refill Amlodipine  5 mg daily  Orders: -     Comprehensive metabolic panel with GFR -     amLODIPine  Besylate; Take 1 tablet (5 mg total) by mouth in the morning.  Dispense: 90 tablet; Refill: 3 -     CBC with Differential/Platelet  Rhinitis, unspecified type Assessment & Plan: Rhinorrhea, postnasal drip, and cough, exacerbated by outdoor exposure. No fever or significant productive cough. - Continue Flonase , two sprays in the morning and evening. - Prescribe Astelin  nasal spray, two sprays in the morning and evening. - Prescribe Xyzal  at night for additional symptom control.  Orders: -     Fluticasone  Propionate; Place 2 sprays into both nostrils in the morning and at bedtime.  Dispense: 16 g; Refill: 6 -     Azelastine  HCl; Place 2 sprays into both nostrils 2 (two) times daily. Use in each nostril as directed  Dispense: 30 mL; Refill: 12 -     Levocetirizine Dihydrochloride ; Take 1 tablet (5 mg total) by mouth every evening.  Dispense: 30 tablet; Refill: 11  Vitamin D  deficiency -     VITAMIN D  25 Hydroxy (Vit-D  Deficiency, Fractures)  Vitamin B 12  deficiency -     Vitamin B12  Hyperlipidemia, unspecified hyperlipidemia type Assessment & Plan: The 10-year ASCVD risk score (Arnett DK, et al., 2019) is: 21.3%  Not interested in statin Check fasting lipids  Orders: -     Lipid panel  Prostate cancer (HCC) -     PSA, Medicare  Cough in adult -     Albuterol  Sulfate HFA; Inhale 2 puffs into the lungs every 6 (six) hours as needed for wheezing or shortness of breath.  Dispense: 8 g; Refill: 0  Bronchitis Assessment & Plan: Possible early bronchitis indicated by slight abnormality in lung sounds. Differential includes postnasal drip. No chest x-ray needed. - Prescribe albuterol  inhaler for use as needed, especially at night. - Advise to report fever, worsening cough, or chest pain before Friday.     PDMP reviewed  Return in about 1 week (around 04/01/2024).  Valli Gaw, MD

## 2024-03-25 NOTE — Patient Instructions (Addendum)
 It was a pleasure meeting you today. Thank you for allowing me to take part in your health care.  Our goals for today as we discussed include:  Increase Flonase  to 2 sprays 2 times a day Start Astelin 2 sprays 2 times a day Start Xyzal 5 mg at night  Albuterol 1-2 puffs as needed for cough at night If any fever, difficulty breathing or shortness of breath notify MD  Continue to use CPAP at night  We will get some labs today.  If they are abnormal or we need to do something about them, I will call you.  If they are normal, I will send you a message on MyChart (if it is active) or a letter in the mail.  If you don't hear from us  in 2 weeks, please call the office at the number below.   Refill sent for blood pressure medication  This is a list of the screening recommended for you and due dates:  Health Maintenance  Topic Date Due   DTaP/Tdap/Td vaccine (1 - Tdap) Never done   Pneumonia Vaccine (1 of 2 - PCV) Never done   Zoster (Shingles) Vaccine (1 of 2) Never done   Medicare Annual Wellness Visit  03/31/2023   COVID-19 Vaccine (7 - 2024-25 season) 07/29/2023   Flu Shot  06/27/2024   Colon Cancer Screening  12/28/2028   Hepatitis C Screening  Completed   HPV Vaccine  Aged Out   Meningitis B Vaccine  Aged Out        If you have any questions or concerns, please do not hesitate to call the office at [phone].  I look forward to our next visit and until then take care and stay safe.  Regards,   Valli Gaw, MD   [facility]

## 2024-04-01 ENCOUNTER — Encounter: Payer: Self-pay | Admitting: Family Medicine

## 2024-04-01 ENCOUNTER — Ambulatory Visit (INDEPENDENT_AMBULATORY_CARE_PROVIDER_SITE_OTHER): Admitting: Family Medicine

## 2024-04-01 VITALS — BP 138/70 | HR 75 | Temp 99.0°F | Resp 20 | Ht 69.0 in | Wt 149.0 lb

## 2024-04-01 DIAGNOSIS — S0990XA Unspecified injury of head, initial encounter: Secondary | ICD-10-CM

## 2024-04-01 DIAGNOSIS — J310 Chronic rhinitis: Secondary | ICD-10-CM | POA: Insufficient documentation

## 2024-04-01 DIAGNOSIS — J40 Bronchitis, not specified as acute or chronic: Secondary | ICD-10-CM

## 2024-04-01 DIAGNOSIS — R059 Cough, unspecified: Secondary | ICD-10-CM | POA: Insufficient documentation

## 2024-04-01 NOTE — Assessment & Plan Note (Signed)
 The 10-year ASCVD risk score (Arnett DK, et al., 2019) is: 21.3%  Not interested in statin Check fasting lipids

## 2024-04-01 NOTE — Progress Notes (Signed)
 SUBJECTIVE:   Chief Complaint  Patient presents with   Wheezing    1 week follow up   HPI Presents for follow up bronchitis  Discussed the use of AI scribe software for clinical note transcription with the patient, who gave verbal consent to proceed.  History of Present Illness David Church is a 73 year old male who presents for a follow-up visit after a viral respiratory illness.  No current chest pain, shortness of breath, cough, or wheezing. He used the albuterol  inhaler prescribed at the last visit and feels some improvement. No fevers. He continues to use nasal spray and Xyzal  at night, which he finds effective.  He had a fall yesterday while doing yard work, tripping over a piece of wire and falling face down on gravel. He did not lose consciousness and reports no pain, but experiences burning and swelling around the eye. He recalls having a tetanus vaccine four to five years ago and declines another dose today. He had consumed a beer prior to the fall, and denies any alcohol-related impairment or loss of consciousness during the fall.    PERTINENT PMH / PSH: As above  OBJECTIVE:  BP 138/70   Pulse 75   Temp 99 F (37.2 C)   Resp 20   Ht 5\' 9"  (1.753 m)   Wt 149 lb (67.6 kg)   SpO2 98%   BMI 22.00 kg/m    Physical Exam Vitals reviewed.  Constitutional:      General: He is not in acute distress.    Appearance: Normal appearance. He is normal weight. He is not ill-appearing, toxic-appearing or diaphoretic.  Eyes:     General:        Right eye: No discharge.        Left eye: No discharge.  Cardiovascular:     Rate and Rhythm: Normal rate and regular rhythm.     Heart sounds: Normal heart sounds.  Pulmonary:     Effort: Pulmonary effort is normal.     Breath sounds: Normal breath sounds. No wheezing.  Abdominal:     General: Bowel sounds are normal.  Musculoskeletal:        General: Normal range of motion.     Cervical back: Normal range of motion.   Skin:    General: Skin is warm and dry.  Neurological:     Mental Status: He is alert and oriented to person, place, and time. Mental status is at baseline.  Psychiatric:        Mood and Affect: Mood normal.        Behavior: Behavior normal.        Thought Content: Thought content normal.        Judgment: Judgment normal.               03/25/2024    8:48 AM 09/03/2023   10:22 AM 04/28/2022    1:16 PM 03/30/2022   12:28 PM 03/09/2021    3:11 PM  Depression screen PHQ 2/9  Decreased Interest 0 0 0 0 0  Down, Depressed, Hopeless 0 0 0 0 0  PHQ - 2 Score 0 0 0 0 0  Altered sleeping 0 0     Tired, decreased energy 0 0     Change in appetite 0 0     Feeling bad or failure about yourself  0 0     Trouble concentrating 0 0     Moving slowly or fidgety/restless 0 0  Suicidal thoughts 0 0     PHQ-9 Score 0 0     Difficult doing work/chores Not difficult at all Not difficult at all         03/25/2024    8:48 AM 09/03/2023   10:23 AM 05/26/2020    2:54 PM 03/18/2020    2:01 PM  GAD 7 : Generalized Anxiety Score  Nervous, Anxious, on Edge  0 0 0  Control/stop worrying 0 0 0 0  Worry too much - different things 0 0 0 0  Trouble relaxing 0 0 0 0  Restless 0 0 0 0  Easily annoyed or irritable 0 0 0 0  Afraid - awful might happen 0 0 0 0  Total GAD 7 Score  0 0 0  Anxiety Difficulty Not difficult at all Not difficult at all Not difficult at all Not difficult at all    ASSESSMENT/PLAN:  Traumatic injury of head, initial encounter Assessment & Plan: Mechanical fall causing injury to right facial area.  No LOC, seizure like activity at the time of incident.  Did not seek medical attention.  Continues to have som facial pain. No headaches, visual changes or stroke like symptoms.  Neurologic exam benign today.  Periorbital edema right eye, without pain, EOMs normal and PERLA. Offered head/facial imaging.  Patient politely declined.  Recommend ED evaluation if any weakness, nausea,  vomiting, visual changes.  Patient agreed.  If any pain in right eye patient to follow up with Eye clinic [facility] Tetanus vaccine up to date. - Apply ice to reduce swelling.   Bronchitis Assessment & Plan: Resolved. No chest x-ray needed. - Albuterol  inhaler for use as needed, especially at night.     PDMP reviewed  Return if symptoms worsen or fail to improve, for PCP.  Valli Gaw, MD

## 2024-04-01 NOTE — Assessment & Plan Note (Addendum)
 Rhinorrhea, postnasal drip, and cough, exacerbated by outdoor exposure. No fever or significant productive cough. - Continue Flonase , two sprays in the morning and evening. - Prescribe Astelin  nasal spray, two sprays in the morning and evening. - Prescribe Xyzal  at night for additional symptom control.

## 2024-04-01 NOTE — Patient Instructions (Addendum)
 It was a pleasure meeting you today. Thank you for allowing me to take part in your health care.  Our goals for today as we discussed include:  Lungs clear. No indication for chest xray today Continue current management   Ice for swelling  Tylenol  as needed for discomfort Keep area clean  If any worsening symptoms please go to the ED for evaluation.  This is a list of the screening recommended for you and due dates:  Health Maintenance  Topic Date Due   DTaP/Tdap/Td vaccine (1 - Tdap) Never done   Pneumonia Vaccine (1 of 2 - PCV) Never done   Zoster (Shingles) Vaccine (1 of 2) Never done   Medicare Annual Wellness Visit  03/31/2023   COVID-19 Vaccine (7 - 2024-25 season) 07/29/2023   Flu Shot  06/27/2024   Colon Cancer Screening  12/28/2028   Hepatitis C Screening  Completed   HPV Vaccine  Aged Out   Meningitis B Vaccine  Aged Out      If you have any questions or concerns, please do not hesitate to call the office at [phone].  I look forward to our next visit and until then take care and stay safe.  Regards,   Valli Gaw, MD   [facility]

## 2024-04-01 NOTE — Assessment & Plan Note (Signed)
 Possible early bronchitis indicated by slight abnormality in lung sounds. Differential includes postnasal drip. No chest x-ray needed. - Prescribe albuterol  inhaler for use as needed, especially at night. - Advise to report fever, worsening cough, or chest pain before Friday.

## 2024-04-01 NOTE — Assessment & Plan Note (Signed)
 Routine wellness visit. Blood work to include cholesterol, renal function, hepatic function, and vitamin D  levels. Previous elevated PSA noted. Colonoscopy not due until 2030. Discussed vaccines, declined due to needle aversion. - Order blood work including cholesterol, renal function, hepatic function, and vitamin D  levels. - Check PSA levels. History of prostate cancer - Discuss option of obtaining vaccines at a pharmacy if desired.

## 2024-04-01 NOTE — Assessment & Plan Note (Signed)
 Well controlled on Amlodipine  5mg  daily. No reported side effects. Discussed potential benefits of switching medication timing to nighttime. -Refill Amlodipine  5 mg daily

## 2024-04-04 ENCOUNTER — Other Ambulatory Visit: Payer: Self-pay | Admitting: Family Medicine

## 2024-04-04 ENCOUNTER — Telehealth: Payer: Self-pay

## 2024-04-04 ENCOUNTER — Encounter: Payer: Self-pay | Admitting: Internal Medicine

## 2024-04-04 DIAGNOSIS — N529 Male erectile dysfunction, unspecified: Secondary | ICD-10-CM

## 2024-04-04 NOTE — Telephone Encounter (Signed)
 Pt called to arrange a TOC appt. Appt scheduled for 10/28 at 1340. Pt verbalized understanding.   Copied from CRM [id number]. Topic: Appointments - Transfer of Care >> Apr 04, 2024 10:04 AM Jeris Montes S wrote: Pt is requesting to transfer FROM: Sueanne Emerald, tanya Pt is requesting to transfer TO: bair, charanpreet Reason for requested transfer: Dr Sueanne Emerald no longer available It is the responsibility of the team the patient would like to transfer to (Dr. Casimir Cleaver, charanpreet) to reach out to the patient if for any reason this transfer is not acceptable.

## 2024-04-04 NOTE — Telephone Encounter (Signed)
 Noted! Thank you

## 2024-04-04 NOTE — Telephone Encounter (Signed)
 Scheduled for a TOC in October.

## 2024-04-04 NOTE — Telephone Encounter (Signed)
 Copied from CRM [id number]. Topic: Appointments - Transfer of Care >> Apr 04, 2024 10:04 AM Jeris Montes S wrote: Pt is requesting to transfer FROM: Sueanne Emerald, tanya Pt is requesting to transfer TO: bair, charanpreet Reason for requested transfer: Dr Sueanne Emerald no longer available It is the responsibility of the team the patient would like to transfer to (Dr. Casimir Cleaver, charanpreet) to reach out to the patient if for any reason this transfer is not acceptable.

## 2024-04-06 ENCOUNTER — Encounter: Payer: Self-pay | Admitting: Family Medicine

## 2024-04-06 DIAGNOSIS — S0990XA Unspecified injury of head, initial encounter: Secondary | ICD-10-CM | POA: Insufficient documentation

## 2024-04-06 NOTE — Assessment & Plan Note (Addendum)
 Mechanical fall causing injury to right facial area.  No LOC, seizure like activity at the time of incident.  Did not seek medical attention.  Continues to have som facial pain. No headaches, visual changes or stroke like symptoms.  Neurologic exam benign today.  Periorbital edema right eye, without pain, EOMs normal and PERLA. Offered head/facial imaging.  Patient politely declined.  Recommend ED evaluation if any weakness, nausea, vomiting, visual changes.  Patient agreed.  If any pain in right eye patient to follow up with Eye clinic Reports Tetanus vaccine up to date. - Apply ice to reduce swelling.

## 2024-04-06 NOTE — Assessment & Plan Note (Addendum)
 Resolved. No chest x-ray needed. - Albuterol  inhaler for use as needed, especially at night.

## 2024-04-10 ENCOUNTER — Encounter: Payer: Self-pay | Admitting: Sleep Medicine

## 2024-04-10 ENCOUNTER — Ambulatory Visit: Admitting: Sleep Medicine

## 2024-04-10 VITALS — BP 138/80 | HR 64 | Temp 98.0°F | Ht 69.0 in | Wt 149.6 lb

## 2024-04-10 DIAGNOSIS — G4752 REM sleep behavior disorder: Secondary | ICD-10-CM

## 2024-04-10 DIAGNOSIS — I10 Essential (primary) hypertension: Secondary | ICD-10-CM

## 2024-04-10 DIAGNOSIS — Z87891 Personal history of nicotine dependence: Secondary | ICD-10-CM | POA: Diagnosis not present

## 2024-04-10 DIAGNOSIS — G4733 Obstructive sleep apnea (adult) (pediatric): Secondary | ICD-10-CM

## 2024-04-10 MED ORDER — CLONAZEPAM 0.5 MG PO TABS: 0.5000 mg | ORAL_TABLET | Freq: Every day | ORAL | 1 refills | Status: DC

## 2024-04-10 NOTE — Progress Notes (Signed)
 Name:David Church MRN: [id number] DOB: 18-Oct-1951   CHIEF COMPLAINT:  CPAP F/U, RBD   HISTORY OF PRESENT ILLNESS:  David Church is a 73 y.o. w/ a h/o OSA, RBD and HTN who presents for RBD and CPAP f/u. Reports that he was diagnosed with RBD several years ago and subsequently started on Melatonin which has not provided any benefit. States that he has titrated up to 15 mg nightly.   Reports also using CPAP therapy every night. Compliance data is unavailable for review due to his device not transmitting electronically. He is currently using a full face mask, which is comfortable Reports feeling refreshed upon awakening with CPAP therapy. States that he naps occasionally. Reports occasional air leaks. States that he is having violent dream enactment almost every night. Denies dream recall. Reports a family history of dementia. Denies parkinson type symptoms.    Bedtime 11:30 pm Sleep onset 1 min Rise time 5-8 am   EPWORTH SLEEP SCORE    09/30/2020   11:00 AM  Results of the Epworth flowsheet  Sitting and reading 0  Watching TV 2  Sitting, inactive in a public place (e.g. a theatre or a meeting) 0  As a passenger in a car for an hour without a break 0  Lying down to rest in the afternoon when circumstances permit 2  Sitting and talking to someone 0  Sitting quietly after a lunch without alcohol 0  In a car, while stopped for a few minutes in traffic 0  Total score 4    PAST MEDICAL HISTORY :   has a past medical history of Allergy, Closed compression fracture of L5 vertebra (HCC), COVID-19 (08/24/2021), DDD (degenerative disc disease), lumbar, Granulomatous lung disease (HCC), Hemangioma, Hiatal hernia, Hypertension, Liver cyst, OSA on CPAP, Parasomnia, Prediabetes, Prostate cancer (HCC), and REM sleep behavior disorder.  has a past surgical history that includes Hernia repair; Axillary lymph node biopsy (Right, 09/18/2019); left cataract surgery (Left); Cardiac catheterization;  Prostate biopsy (N/A, 02/23/2022); and High intensity focused ultrasound (hifu) of the prostrate (N/A, 04/13/2022). Prior to Admission medications   Medication Sig Start Date End Date Taking? Authorizing Provider  albuterol  (VENTOLIN  HFA) 108 (90 Base) MCG/ACT inhaler Inhale 2 puffs into the lungs every 6 (six) hours as needed for wheezing or shortness of breath. 03/25/24  Yes Valli Gaw, MD  amLODipine  (NORVASC ) 5 MG tablet Take 1 tablet (5 mg total) by mouth in the morning. 03/25/24  Yes Valli Gaw, MD  azelastine  (ASTELIN ) 0.1 % nasal spray Place 2 sprays into both nostrils 2 (two) times daily. Use in each nostril as directed 03/25/24  Yes Valli Gaw, MD  fluticasone  (FLONASE ) 50 MCG/ACT nasal spray Place 2 sprays into both nostrils in the morning and at bedtime. 03/25/24  Yes Walsh, Tanya, MD  levocetirizine (XYZAL ) 5 MG tablet Take 1 tablet (5 mg total) by mouth every evening. 03/25/24  Yes Valli Gaw, MD  melatonin 5 MG TABS Take 1 tablet (5 mg total) by mouth at bedtime. 08/04/22  Yes Sood, Vineet, MD  Multiple Vitamin (MULTIVITAMIN) tablet Take 1 tablet by mouth daily. One A Day   Yes [provider]  tadalafil  (CIALIS ) 5 MG tablet TAKE 1 TABLET BY MOUTH EVERY DAY IN THE EVENING 04/04/24  Yes Valli Gaw, MD   Allergies  Allergen Reactions   Pollen Extract Itching    Sinusitis    FAMILY HISTORY:  family history includes Alzheimer's disease in his father; Cancer (age of onset:  80) in his mother; Diabetes in his mother; Heart attack in his father; Hypertension in his mother; Lung cancer in his maternal uncle; Stomach cancer in his maternal aunt. SOCIAL HISTORY:  reports that he quit smoking about 3 years ago. His smoking use included cigarettes. He started smoking about 52 years ago. He has a 4.9 pack-year smoking history. He has never used smokeless tobacco. He reports current alcohol use of about 2.0 standard drinks of alcohol per week. He reports that he does not currently use  drugs after having used the following drugs: Marijuana and Cocaine.   Review of Systems:  Gen:  Denies  fever, sweats, chills weight loss  HEENT: Denies blurred vision, double vision, ear pain, eye pain, hearing loss, nose bleeds, sore throat Cardiac:  No dizziness, chest pain or heaviness, chest tightness,edema, No JVD Resp:   No cough, -sputum production, -shortness of breath,-wheezing, -hemoptysis,  Gi: Denies swallowing difficulty, stomach pain, nausea or vomiting, diarrhea, constipation, bowel incontinence Gu:  Denies bladder incontinence, burning urine Ext:   Denies Joint pain, stiffness or swelling Skin: Denies  skin rash, easy bruising or bleeding or hives Endoc:  Denies polyuria, polydipsia , polyphagia or weight change Psych:   Denies depression, insomnia or hallucinations  Other:  All other systems negative  VITAL SIGNS: BP 138/80 (BP Location: Right Arm, Patient Position: Sitting, Cuff Size: Normal)   Pulse 64   Temp 98 F (36.7 C) (Oral)   Ht 5\' 9"  (1.753 m)   Wt 149 lb 9.6 oz (67.9 kg)   SpO2 98%   BMI 22.09 kg/m    Physical Examination:   General Appearance: No distress  EYES PERRLA, EOM intact.   NECK Supple, No JVD Pulmonary: normal breath sounds, No wheezing.  CardiovascularNormal S1,S2.  No m/r/g.   Abdomen: Benign, Soft, non-tender. Skin:   warm, no rashes, no ecchymosis  Extremities: normal, no cyanosis, clubbing. Neuro:without focal findings,  speech normal  PSYCHIATRIC: Mood, affect within normal limits.   ASSESSMENT AND PLAN  OSA Will complete HST for reassessment of OSA and then complete order for a new CPAP device. Discussed the consequences of untreated sleep apnea. Advised not to drive drowsy for safety of patient and others. Will follow up in 3 months.   RBD D/c Melatonin and starting patient on Clonazepam  0.5 mg nightly. Due to family history of dementia in his father, will refer to Dr. Mason Sole to R/O any underlying neurocognitive disorders.    HTN Stable, on current management. Following with PCP.    MEDICATION ADJUSTMENTS/LABS AND TESTS ORDERED: Recommend Sleep Study   Patient  satisfied with Plan of action and management. All questions answered  Follow up to review HST results and treatment plan.   I spent a total of 43 minutes reviewing chart data, face-to-face evaluation with the patient, counseling and coordination of care as detailed above.    Dequann Vandervelden, M.D.  Sleep Medicine [facility]

## 2024-04-10 NOTE — Patient Instructions (Addendum)
 Diagnosis and Tests How is REM sleep behavior disorder diagnosed? If you have symptoms of REM sleep behavior disorder (RBD), it's important to see a healthcare provider. They'll ask you questions about your symptoms and medical history. If you have a bed partner or housemates, your provider will likely want to ask them questions about your sleeping behavior.  Your provider will also perform a physical exam and a neurological exam. They may refer you to a sleep specialist.  To receive a diagnosis of REM sleep behavior disorder, you'll undergo an in-lab video sleep study -- or polysomnogram (PSG). Sleep studies are tests that record specific body functions during sleep, such as your:  Heart rate. Breathing rate and airflow. Brain wave activity. Eye movements. Muscle movements of your chin and upper extremities. According to the International Classification of Sleep Disorders, a diagnosis of RBD requires all of the following:  You have repeated episodes of sleep-related vocalization and/or complex movement behaviors. The behaviors are documented by a sleep study (polysomnography) and occur during REM sleep or can be assumed to happen during REM sleep based on your clinical history. The sleep study shows that you experience REM sleep without atonia (muscle paralysis). You don't have seizure-related activity during REM sleep. The sleep disturbances aren't better explained by another sleep disorder, medical condition or mental health condition. They're also not caused by medication side effects or substance use disorder. Management and Treatment How is REM sleep behavior disorder treated? The main goal of treatment for REM sleep behavior disorder (RBD) is to create a safe sleeping environment for you and your bed partner. This can involve certain strategies and medications.  Safety measures for RBD Steps to create a safer sleeping environment include:  Removing sharp, glass and  heavy objects away from your bed. Placing pillows between you and surrounding structures, such as the headboard or a nightstand. Placing a mattress on the floor next to your bed (in case you fall out of bed) or using padded bedside rails. Sleeping in a sleeping bag. If the symptoms are severe, it may be safest for your bed partner if you sleep alone in a different room.  You should also try to avoid drinking alcohol, as this can trigger an RBD episode and make the condition worse.  Medications for RBD If your symptoms are severe and safety measures aren't enough to prevent injury, your healthcare provider may prescribe medication to manage your symptoms. While there aren't any U.[place] Food and Drug Administration (FDA)-approved medications specifically for RBD, studies have shown that melatonin, clonazepam  and pramipexole can reduce symptoms in some cases.  Melatonin is a hormone your pineal gland naturally makes that's essential for regulating your sleep cycle. However, there are also synthetic forms of melatonin. It's considered the first-line medication for treating RBD, as it rarely causes side effects. Your provider will likely recommend starting at a baseline dose and then increasing the dose until your symptoms improve.  Clonazepam  is a sedative. Researchers aren't sure why it helps treat RBD. Most people with RBD who take a low dose of clonazepam  before they sleep have few or no nightmares, vocalizations or dream enactment behaviors. Clonazepam  can cause unpleasant side effects. Because of this, your provider may only prescribe it if melatonin doesn't help.  Pramipexole is a dopamine agonist. Providers primarily prescribe it to treat Parkinson's disease and restless leg syndrome, but recent research shows that it can help treat the symptoms of RBD. Researchers think this medication works because  RBD may be a dopaminergic deficiency disorder.  Prevention Can I prevent REM sleep behavior  disorder? In most cases, there's nothing you can do to prevent RBD. This is because the condition has risk factors that you can't prevent or change, such as your age or having narcolepsy or a neurodegenerative condition.  If substances like alcohol cause RBD or make it worse, stopping the use of these substances can make RBD go away.  Outlook / Prognosis What is the prognosis of REM sleep behavior disorder? The prognosis (outlook) of REM sleep behavior disorder depends on a few factors, including:  If there's an underlying cause, such as narcolepsy (secondary RBD) or use of an antidepressant (drug-induced RBD), or if it developed spontaneously (isolated RBD). How severe your symptoms are and if your symptoms lead to injury. If it's treated or untreated. If you have RBD and a neurodegenerative condition. The most important implication of RBD is its association with neurological conditions, particularly Parkinson's disease, multiple system atrophy and Lewy body dementia. More and more research suggests that RBD can be one of the earliest warning signs of these conditions. This is the case when RBD develops spontaneously and isn't due to narcolepsy or taking medications.  RBD can cause serious injury to you and/or your bed partner, so it's important to seek treatment.  People who have RBD in addition to a neurological condition often have a poor prognosis. For example, quality of life is worse in people who have early Parkinson's disease and RBD than in people who have early Parkinson's disease without RBD.  What are the possible complications of RBD? Due to the potentially violent nature of their movements, people with REM sleep behavior disorder can put themselves -- and their bed partner -- at risk of injury. It can also cause frequent sleep disruptions, which can affect your overall quality of sleep.  Injuries can include:  Bruises. Cuts. Sprains. Broken bones. Head injury and subdural  hematomas. In some cases, these injuries can be life-threatening.  Up to 90% of partners of people with RBD have sleep issues. Over 60% have experienced a physical injury.  Because of this, it's important to seek treatment for RBD -- or at the very least create a safer sleeping environment.  Living With When should I see my healthcare provider about RBD? If you've received a diagnosis of isolated RBD, you'll likely need to see your healthcare provider regularly to check for signs of the neurological conditions that are strongly associated with it.  If you develop any other symptoms, such as issues with movement or cognitive changes, see your provider.  If you or your bedpartner are injured as a result of RBD, see your provider.

## 2024-04-30 ENCOUNTER — Encounter

## 2024-04-30 DIAGNOSIS — G4733 Obstructive sleep apnea (adult) (pediatric): Secondary | ICD-10-CM

## 2024-05-07 ENCOUNTER — Other Ambulatory Visit: Payer: Self-pay | Admitting: Family Medicine

## 2024-05-07 DIAGNOSIS — N529 Male erectile dysfunction, unspecified: Secondary | ICD-10-CM

## 2024-05-07 DIAGNOSIS — G4733 Obstructive sleep apnea (adult) (pediatric): Secondary | ICD-10-CM | POA: Diagnosis not present

## 2024-05-13 ENCOUNTER — Other Ambulatory Visit: Payer: Self-pay | Admitting: Neurology

## 2024-05-13 DIAGNOSIS — G3184 Mild cognitive impairment, so stated: Secondary | ICD-10-CM

## 2024-05-13 DIAGNOSIS — G4752 REM sleep behavior disorder: Secondary | ICD-10-CM

## 2024-05-16 DIAGNOSIS — G4733 Obstructive sleep apnea (adult) (pediatric): Secondary | ICD-10-CM

## 2024-05-16 DIAGNOSIS — G4752 REM sleep behavior disorder: Secondary | ICD-10-CM

## 2024-05-16 NOTE — Telephone Encounter (Signed)
Pt has been notified via [employer].

## 2024-05-16 NOTE — Telephone Encounter (Signed)
-----   Message from [facility] D REDDY sent at 05/16/2024  1:47 PM EDT ----- Please notify patient that HST revealed moderate OSA, recommend proceeding with APAP therapy set to 4-16 cm H2O, EPR 3 with the Airtouch N30i nasal mask. Please also schedule a 3 month follow up visit if patient wishes to proceed with CPAP therapy. Thanks ----- Message ----- From: Lynette Saras Sent: 05/15/2024   4:30 PM EDT To: Woodson He, MD  HST results

## 2024-05-19 ENCOUNTER — Ambulatory Visit
Admission: RE | Admit: 2024-05-19 | Discharge: 2024-05-19 | Disposition: A | Discharge status: Home / Self Care | Source: Ambulatory Visit | Attending: Neurology | Admitting: Neurology

## 2024-05-19 DIAGNOSIS — G4752 REM sleep behavior disorder: Secondary | ICD-10-CM | POA: Diagnosis present

## 2024-05-19 DIAGNOSIS — G3184 Mild cognitive impairment, so stated: Secondary | ICD-10-CM | POA: Insufficient documentation

## 2024-05-19 NOTE — Addendum Note (Signed)
 Addended by: Lamir Racca J on: 05/19/2024 08:42 AM   Modules accepted: Orders

## 2024-06-13 ENCOUNTER — Other Ambulatory Visit: Payer: Self-pay | Admitting: Nurse Practitioner

## 2024-06-13 DIAGNOSIS — J310 Chronic rhinitis: Secondary | ICD-10-CM

## 2024-06-13 DIAGNOSIS — I10 Essential (primary) hypertension: Secondary | ICD-10-CM

## 2024-06-13 DIAGNOSIS — N529 Male erectile dysfunction, unspecified: Secondary | ICD-10-CM

## 2024-06-13 DIAGNOSIS — R059 Cough, unspecified: Secondary | ICD-10-CM

## 2024-06-13 MED ORDER — TADALAFIL 5 MG PO TABS: 5.0000 mg | ORAL_TABLET | Freq: Every day | ORAL | 0 refills | Status: DC

## 2024-06-13 MED ORDER — AZELASTINE HCL 0.1 % NA SOLN: 2.0000 | Freq: Two times a day (BID) | NASAL | 12 refills | Status: AC

## 2024-06-13 MED ORDER — FLUTICASONE PROPIONATE 50 MCG/ACT NA SUSP
2.0000 | Freq: Two times a day (BID) | NASAL | 6 refills | Status: AC
Start: 2024-06-13 — End: ?

## 2024-06-13 MED ORDER — AMLODIPINE BESYLATE 5 MG PO TABS: 5.0000 mg | ORAL_TABLET | Freq: Every morning | ORAL | 3 refills | Status: AC

## 2024-06-13 MED ORDER — ALBUTEROL SULFATE HFA 108 (90 BASE) MCG/ACT IN AERS
2.0000 | INHALATION_SPRAY | Freq: Four times a day (QID) | RESPIRATORY_TRACT | 0 refills | Status: DC | PRN
Start: 2024-06-13 — End: 2024-07-13

## 2024-06-13 MED ORDER — LEVOCETIRIZINE DIHYDROCHLORIDE 5 MG PO TABS: 5.0000 mg | ORAL_TABLET | Freq: Every evening | ORAL | 11 refills | Status: AC

## 2024-06-13 NOTE — Telephone Encounter (Signed)
 Copied from CRM [id number]. Topic: Clinical - Medication Refill >> Jun 13, 2024 10:49 AM Berneda FALCON wrote: Medication:  tadalafil  (CIALIS ) 5 MG tablet albuterol  (VENTOLIN  HFA) 108 (90 Base) MCG/ACT inhaler amLODipine  (NORVASC ) 5 MG tablet azelastine  (ASTELIN ) 0.1 % nasal spray clonazePAM  (KLONOPIN ) 0.5 MG tablet fluticasone  (FLONASE ) 50 MCG/ACT nasal spray levocetirizine (XYZAL ) 5 MG tablet  Patient is requesting a 90-day supply, as Hope retired and new PCP appt is in October and he does not want to run out  Has the patient contacted their pharmacy? Yes (Agent: If no, request that the patient contact the pharmacy for the refill. If patient does not wish to contact the pharmacy document the reason why and proceed with request.) (Agent: If yes, when and what did the pharmacy advise?)  This is the patient's preferred pharmacy:  [id number] [address] [facility] - [address] [place] [address] Phone: [phone] Fax: [phone]  Is this the correct pharmacy for this prescription? Yes If no, delete pharmacy and type the correct one.   Has the prescription been filled recently? No  Is the patient out of the medication? Yes  Has the patient been seen for an appointment in the last year OR does the patient have an upcoming appointment? Yes  Can we respond through MyChart? Yes  Agent: Please be advised that Rx refills may take up to 3 business days. We ask that you follow-up with your pharmacy.

## 2024-06-13 NOTE — Telephone Encounter (Signed)
90 Day supply requested

## 2024-06-18 ENCOUNTER — Telehealth: Payer: Self-pay

## 2024-06-18 ENCOUNTER — Other Ambulatory Visit (HOSPITAL_COMMUNITY): Payer: Self-pay

## 2024-06-18 NOTE — Telephone Encounter (Signed)
 Pharmacy Patient Advocate Encounter   Received notification from CoverMyMeds that prior authorization for Tadalafil  5MG  tablets  is required/requested.   Insurance verification completed.   The patient is insured through [facility] [employer].   Per test claim: PA required; PA submitted to above mentioned insurance via CoverMyMeds Key/confirmation #/EOC [id number] Status is pending

## 2024-06-18 NOTE — Telephone Encounter (Signed)
 Pharmacy Patient Advocate Encounter  Received notification from [facility] [employer] that Prior Authorization for Tadalafil  5MG  tablets  has been DENIED.  See denial reason below. No denial letter attached in CMM. Will attach denial letter to Media tab once received.   PA #/Case ID/Reference #: [id number]

## 2024-06-23 NOTE — Telephone Encounter (Signed)
 Sent my chart message to let Patient know that Cialis  has been denied.

## 2024-07-11 ENCOUNTER — Other Ambulatory Visit: Payer: Self-pay | Admitting: Nurse Practitioner

## 2024-07-11 DIAGNOSIS — R059 Cough, unspecified: Secondary | ICD-10-CM

## 2024-07-11 DIAGNOSIS — N529 Male erectile dysfunction, unspecified: Secondary | ICD-10-CM

## 2024-08-05 ENCOUNTER — Ambulatory Visit
Admission: EM | Admit: 2024-08-05 | Discharge: 2024-08-05 | Disposition: A | Discharge status: Home / Self Care | Attending: Emergency Medicine | Admitting: Emergency Medicine

## 2024-08-05 ENCOUNTER — Encounter: Payer: Self-pay | Admitting: Emergency Medicine

## 2024-08-05 ENCOUNTER — Ambulatory Visit: Payer: Self-pay

## 2024-08-05 DIAGNOSIS — J019 Acute sinusitis, unspecified: Secondary | ICD-10-CM

## 2024-08-05 DIAGNOSIS — B349 Viral infection, unspecified: Secondary | ICD-10-CM | POA: Diagnosis not present

## 2024-08-05 LAB — POC SOFIA SARS ANTIGEN FIA: SARS Coronavirus 2 Ag: NEGATIVE

## 2024-08-05 MED ORDER — AMOXICILLIN-POT CLAVULANATE 875-125 MG PO TABS: 1.0000 | ORAL_TABLET | Freq: Two times a day (BID) | ORAL | 0 refills | Status: DC

## 2024-08-05 NOTE — ED Provider Notes (Signed)
 [name]    CSN: [id number] Arrival date & time: 08/05/24  0932      History   Chief Complaint Chief Complaint  Patient presents with   Cough   Headache   Nasal Congestion    HPI David Church is a 73 y.o. male.   Patient presents for evaluation of a fever peaking at 101, nasal congestion, nonproductive cough and intermittent headaches present for 3 days.  Tolerable to food and liquids but appetite is decreased.  Has attempted use of Flonase .  Endorses a history of reoccurring sinusitis.  Denies ear pain, sore throat, shortness of breath or wheezing.  Past Medical History:  Diagnosis Date   Allergy    Closed compression fracture of L5 vertebra (HCC)    COVID-19 08/24/2021   molnupiravir  x bid x 5 days   DDD (degenerative disc disease), lumbar    Granulomatous lung disease (HCC)    Hemangioma    Hiatal hernia    Hypertension    Liver cyst    OSA on CPAP    Parasomnia    Prediabetes    6.1 10/23/18   Prostate cancer (HCC)    REM sleep behavior disorder     Patient Active Problem List   Diagnosis Date Noted   Head trauma 04/06/2024   Rhinitis 04/01/2024   Bronchitis 04/01/2024   Cough in adult 04/01/2024   Leukopenia 09/08/2023   Vitamin B 12 deficiency 09/08/2023   Abnormal glucose 09/08/2023   Erectile dysfunction 09/08/2023   REM sleep behavior disorder 09/26/2022   Prostate cancer (HCC) 04/13/2022   Vitamin D  deficiency 11/17/2021   Elevated PSA 11/17/2021   Liver cyst 11/01/2021   Prediabetes 11/01/2021   Abnormal MRI, cervical spine 11/23/2020   History of granulomatous disease 11/23/2020   Annual physical exam 11/18/2020   Seasonal allergies 11/18/2020   Smoker 10/26/2020   Healthcare maintenance 10/26/2020   OSA on CPAP 10/25/2020   Parasomnia 06/30/2020   Essential hypertension 03/18/2020   Hyperlipidemia 01/13/2020    Past Surgical History:  Procedure Laterality Date   AXILLARY LYMPH NODE BIOPSY Right 09/18/2019    Procedure: AXILLARY LYMPH NODE BIOPSY;  Surgeon: Jordis Laneta FALCON, MD;  Location: [facility] ORS;  Service: General;  Laterality: Right;   CARDIAC CATHETERIZATION     HERNIA REPAIR     15 years ago-right inguinal    HIGH INTENSITY FOCUSED ULTRASOUND (HIFU) OF THE PROSTATE N/A 04/13/2022   Procedure: HIGH INTENSITY FOCUSED ULTRASOUND (HIFU) OF THE PROSTATE;  Surgeon: Kassie Ozell SAUNDERS, MD;  Location: [facility] ORS;  Service: Urology;  Laterality: N/A;   left cataract surgery Left    fall 2022   PROSTATE BIOPSY N/A 02/23/2022   Procedure: PROSTATE BIOPSY GRAYCE;  Surgeon: Kassie Ozell SAUNDERS, MD;  Location: [facility] ORS;  Service: Urology;  Laterality: N/A;       Home Medications    Prior to Admission medications   Medication Sig Start Date End Date Taking? Authorizing Provider  amoxicillin -clavulanate (AUGMENTIN ) 875-125 MG tablet Take 1 tablet by mouth every 12 (twelve) hours. 08/05/24  Yes Lavella Myren R, NP  albuterol  (VENTOLIN  HFA) 108 (90 Base) MCG/ACT inhaler TAKE 2 PUFFS BY MOUTH EVERY 6 HOURS AS NEEDED FOR WHEEZE OR SHORTNESS OF BREATH 07/13/24   Kaur, Charanpreet, NP  amLODipine  (NORVASC ) 5 MG tablet Take 1 tablet (5 mg total) by mouth in the morning. 06/13/24   Kaur, Charanpreet, NP  azelastine  (ASTELIN ) 0.1 % nasal spray Place 2 sprays into both nostrils 2 (two)  times daily. Use in each nostril as directed 06/13/24   Kaur, Charanpreet, NP  clonazePAM  (KLONOPIN ) 0.5 MG tablet Take 1 tablet (0.5 mg total) by mouth at bedtime. 04/10/24 04/10/25  Reddy, Pallavi D, MD  fluticasone  (FLONASE ) 50 MCG/ACT nasal spray Place 2 sprays into both nostrils in the morning and at bedtime. 06/13/24   Kaur, Charanpreet, NP  levocetirizine (XYZAL ) 5 MG tablet Take 1 tablet (5 mg total) by mouth every evening. 06/13/24   Kaur, Charanpreet, NP  melatonin 5 MG TABS Take 1 tablet (5 mg total) by mouth at bedtime. 08/04/22   Sood, Vineet, MD  Multiple Vitamin (MULTIVITAMIN) tablet Take 1 tablet by mouth daily. One A Day    [provider]  tadalafil  (CIALIS ) 5 MG tablet TAKE 1 TABLET (5 MG TOTAL) BY MOUTH DAILY. 07/13/24   Vincente Saber, NP    Family History Family History  Problem Relation Age of Onset   Cancer Mother 61   Diabetes Mother    Hypertension Mother    Alzheimer's disease Father    Heart attack Father    Stomach cancer Maternal Aunt    Lung cancer Maternal Uncle     Social History Social History   Tobacco Use   Smoking status: Former    Current packs/day: 0.00    Average packs/day: 0.1 packs/day for 49.0 years (4.9 ttl pk-yrs)    Types: Cigarettes    Start date: 11/1971    Quit date: 11/2020    Years since quitting: 3.6   Smokeless tobacco: Never  Vaping Use   Vaping status: Never Used  Substance Use Topics   Alcohol use: Yes    Alcohol/week: 2.0 standard drinks of alcohol    Types: 2 Cans of beer per week    Comment: 2 beers a day and wine on special occasion   Drug use: Not Currently    Types: Marijuana, Cocaine    Comment: did drugs but quit in early 30's     Allergies   Pollen extract   Review of Systems Review of Systems   Physical Exam Triage Vital Signs ED Triage Vitals  Encounter Vitals Group     BP 08/05/24 1057 134/80     Girls Systolic BP Percentile --      Girls Diastolic BP Percentile --      Boys Systolic BP Percentile --      Boys Diastolic BP Percentile --      Pulse Rate 08/05/24 1057 88     Resp 08/05/24 1057 18     Temp 08/05/24 1057 98.6 F (37 C)     Temp Source 08/05/24 1057 Oral     SpO2 08/05/24 1057 98 %     Weight --      Height --      Head Circumference --      Peak Flow --      Pain Score 08/05/24 1053 7     Pain Loc --      Pain Education --      Exclude from Growth Chart --    No data found.  Updated Vital Signs BP 134/80 (BP Location: Left Arm)   Pulse 88   Temp 98.6 F (37 C) (Oral)   Resp 18   SpO2 98%   Visual Acuity Right Eye Distance:   Left Eye Distance:   Bilateral Distance:    Right Eye Near:    Left Eye Near:    Bilateral Near:  Physical Exam Constitutional:      Appearance: Normal appearance.  HENT:     Head: Normocephalic.     Right Ear: Tympanic membrane, ear canal and external ear normal.     Left Ear: Tympanic membrane, ear canal and external ear normal.     Nose: Congestion present.     Mouth/Throat:     Mouth: Mucous membranes are moist.     Pharynx: Oropharynx is clear.  Eyes:     Extraocular Movements: Extraocular movements intact.  Cardiovascular:     Rate and Rhythm: Normal rate and regular rhythm.     Pulses: Normal pulses.     Heart sounds: Normal heart sounds.  Pulmonary:     Effort: Pulmonary effort is normal.     Breath sounds: Normal breath sounds.  Neurological:     Mental Status: He is alert and oriented to person, place, and time. Mental status is at baseline.      UC Treatments / Results  Labs (all labs ordered are listed, but only abnormal results are displayed) Labs Reviewed  POC SOFIA SARS ANTIGEN FIA    EKG   Radiology No results found.  Procedures Procedures (including critical care time)  Medications Ordered in UC Medications - No data to display  Initial Impression / Assessment and Plan / UC Course  I have reviewed the triage vital signs and the nursing notes.  Pertinent labs & imaging results that were available during my care of the patient were reviewed by me and considered in my medical decision making (see chart for details).   Acute nonrecurrent sinusitis, viral illness   Patient is in no signs of distress nor toxic appearing.  Vital signs are stable.  Low suspicion for pneumonia, pneumothorax or bronchitis and therefore will defer imaging.  COVID test negative.  Discussed etiology most likely viral due to timeline of symptoms, empirically placed on Augmentin  due to history of reoccurring sinusitis.May use additional over-the-counter medications as needed for supportive care.  May follow-up with urgent care as  needed if symptoms persist or worsen.   Final Clinical Impressions(s) / UC Diagnoses   Final diagnoses:  Viral illness  Acute non-recurrent sinusitis, unspecified location     Discharge Instructions      Your symptoms today are most likely being caused by a virus and should steadily improve in time it can take up to 7 to 10 days before you truly start to see a turnaround however things will get better  You have been placed on your antibiotic due to a history of reoccurring sinus infections, ideally this will keep symptoms from worsening or lingering  Take Augmentin  twice daily for 7 days    You can take Tylenol  and/or Ibuprofen as needed for fever reduction and pain relief.   For cough: honey 1/2 to 1 teaspoon (you can dilute the honey in water or another fluid).  You can also use guaifenesin and dextromethorphan for cough. You can use a humidifier for chest congestion and cough.  If you don't have a humidifier, you can sit in the bathroom with the hot shower running.      For sore throat: try warm salt water gargles, cepacol lozenges, throat spray, warm tea or water with lemon/honey, popsicles or ice, or OTC cold relief medicine for throat discomfort.   For congestion: take a daily anti-histamine like Zyrtec, Claritin, and a oral decongestant, such as pseudoephedrine.  You can also use Flonase  1-2 sprays in each nostril daily.   It  is important to stay hydrated: drink plenty of fluids (water, gatorade/powerade/pedialyte, juices, or teas) to keep your throat moisturized and help further relieve irritation/discomfort.    ED Prescriptions     Medication Sig Dispense Auth. Provider   amoxicillin -clavulanate (AUGMENTIN ) 875-125 MG tablet Take 1 tablet by mouth every 12 (twelve) hours. 14 tablet Sheryll Dymek R, NP      PDMP not reviewed this encounter.   Teresa Shelba SAUNDERS, NP 08/05/24 1122

## 2024-08-05 NOTE — Discharge Instructions (Signed)
 Your symptoms today are most likely being caused by a virus and should steadily improve in time it can take up to 7 to 10 days before you truly start to see a turnaround however things will get better  You have been placed on your antibiotic due to a history of reoccurring sinus infections, ideally this will keep symptoms from worsening or lingering  Take Augmentin  twice daily for 7 days    You can take Tylenol  and/or Ibuprofen as needed for fever reduction and pain relief.   For cough: honey 1/2 to 1 teaspoon (you can dilute the honey in water or another fluid).  You can also use guaifenesin and dextromethorphan for cough. You can use a humidifier for chest congestion and cough.  If you don't have a humidifier, you can sit in the bathroom with the hot shower running.      For sore throat: try warm salt water gargles, cepacol lozenges, throat spray, warm tea or water with lemon/honey, popsicles or ice, or OTC cold relief medicine for throat discomfort.   For congestion: take a daily anti-histamine like Zyrtec, Claritin, and a oral decongestant, such as pseudoephedrine.  You can also use Flonase  1-2 sprays in each nostril daily.   It is important to stay hydrated: drink plenty of fluids (water, gatorade/powerade/pedialyte, juices, or teas) to keep your throat moisturized and help further relieve irritation/discomfort.

## 2024-08-05 NOTE — Telephone Encounter (Signed)
 Noted

## 2024-08-05 NOTE — ED Triage Notes (Signed)
 Patient reports cough, headache , runny nose and burning eyes x 3 days. Patient has used Flonase  with no relief. Rates pain 7/10.

## 2024-08-05 NOTE — Telephone Encounter (Signed)
 FYI Only or Action Required?: FYI only for provider.  Patient was last seen in primary care on 04/01/2024 by Hope Merle, MD.  Called Nurse Triage reporting Cough, Nasal Congestion, and Headache.  Symptoms began several days ago.  Interventions attempted: OTC medications: Flonase  nasal spray.  Symptoms are: runny nose, watery eyes, dry cough, post nasal drip, chills, headache, temperature 100.1 gradually worsening.  Triage Disposition: See PCP When Office is Open (Within 3 Days)  Patient/caregiver understands and will follow disposition?: Yes            Message from [facility] S sent at 08/05/2024 10:23 AM EDT  Patient possible has sinus infection with low grade fever and coughing. [phone]   Reason for Disposition  [1] Taking antihistamines or using nasal steroids > 2 days AND [2] nasal allergy symptoms interfere with sleep, school, or work  Answer Assessment - Initial Assessment Questions Patient currently being seen at urgent care per chart. Patient states he will call back later if he would like to schedule an appt at [facility].   1. SYMPTOM: What's the main symptom you're concerned about? (e.g., runny nose, stuffiness, sneezing, itching)     Runny nose, dry cough (occasional)  2. SEVERITY: How bad is it? What does it keep you from doing? (e.g., sleeping, working)      7/10. He states he was able to sleep a little last night.  3. EYES: Are your eyes also red, watery, and itchy?      Watery eyes.  4. TRIGGER: What pollen or other allergic substance do you think is causing the symptoms?      Yes, he states he is allergic to pollen. He states this time of year, with season changes he gets sinus infection.  5. TREATMENT: What medicine are you using? What medicine worked best in the past?     He states he has been using a nasal spray (Flonase ) and that seems to help.  6. OTHER SYMPTOMS: Do you have any other symptoms? (e.g., coughing, difficulty  breathing, wheezing)     Chills, headaches, fever 100.1, post nasal drip in the morning (clears out white mucus). Denies difficulty breathing, chest pain, wheezing, sinus pain or congestion, nausea, vomiting, diarrhea, recent sick exposure.  7. PREGNANCY: Is there any chance you are pregnant? When was your last menstrual period?     N/A.  Protocols used: Nasal Allergies (Hay Fever)-A-AH

## 2024-08-22 ENCOUNTER — Ambulatory Visit: Admitting: Nurse Practitioner

## 2024-08-22 ENCOUNTER — Encounter: Payer: Self-pay | Admitting: Nurse Practitioner

## 2024-08-22 VITALS — BP 116/70 | HR 66 | Temp 97.5°F | Ht 69.0 in | Wt 144.0 lb

## 2024-08-22 DIAGNOSIS — C61 Malignant neoplasm of prostate: Secondary | ICD-10-CM | POA: Diagnosis not present

## 2024-08-22 DIAGNOSIS — R7303 Prediabetes: Secondary | ICD-10-CM

## 2024-08-22 DIAGNOSIS — N529 Male erectile dysfunction, unspecified: Secondary | ICD-10-CM | POA: Diagnosis not present

## 2024-08-22 DIAGNOSIS — E785 Hyperlipidemia, unspecified: Secondary | ICD-10-CM

## 2024-08-22 DIAGNOSIS — I10 Essential (primary) hypertension: Secondary | ICD-10-CM | POA: Diagnosis not present

## 2024-08-22 DIAGNOSIS — G4733 Obstructive sleep apnea (adult) (pediatric): Secondary | ICD-10-CM

## 2024-08-22 DIAGNOSIS — J302 Other seasonal allergic rhinitis: Secondary | ICD-10-CM

## 2024-08-22 MED ORDER — TADALAFIL 5 MG PO TABS: 5.0000 mg | ORAL_TABLET | Freq: Every day | ORAL | 0 refills | Status: DC

## 2024-08-22 NOTE — Progress Notes (Signed)
 Established Patient Office Visit  Subjective:  Patient ID: David Church, male    DOB: 23-Sep-1951  Age: 73 y.o. MRN: [id number]  CC:  Chief Complaint  Patient presents with   Establish Care    Transfer of Care   Discussed the use of a AI scribe software for clinical note transcription with the patient, who gave verbal consent to proceed.  HPI  David Church is a 73 year old male who presents for transfer of care.  His previous PCP was Dr. Hope.  He manages hypertension with amlodipine , maintaining home blood pressure readings around 135/70 mmHg, though he can rise with dietary changes.   He has high cholesterol but is not on statin therapy.   Prostate cancer follow-up is needed, with PSA levels advised to be checked every six months.   Seasonal allergies are controlled with Flonase  and Xyzal .   He uses Cialis  for erectile dysfunction.   Sleep quality has improved with CPAP use and clonazepam .   He consumes two beers daily and quit smoking three years ago, previously smoking two cigarettes a day. Occasional constipation is managed with increased water intake.  HPI   Past Medical History:  Diagnosis Date   Allergy    Closed compression fracture of L5 vertebra (HCC)    COVID-19 08/24/2021   molnupiravir  x bid x 5 days   DDD (degenerative disc disease), lumbar    Granulomatous lung disease (HCC)    Hemangioma    Hiatal hernia    Hypertension    Liver cyst    OSA on CPAP    Parasomnia    Prediabetes    6.1 10/23/18   Prostate cancer (HCC)    REM sleep behavior disorder     Past Surgical History:  Procedure Laterality Date   AXILLARY LYMPH NODE BIOPSY Right 09/18/2019   Procedure: AXILLARY LYMPH NODE BIOPSY;  Surgeon: Jordis Laneta FALCON, MD;  Location: [facility];  Service: General;  Laterality: Right;   CARDIAC CATHETERIZATION     HERNIA REPAIR     15 years ago-right inguinal    HIGH INTENSITY FOCUSED ULTRASOUND (HIFU) OF THE PROSTATE N/A 04/13/2022   Procedure:  HIGH INTENSITY FOCUSED ULTRASOUND (HIFU) OF THE PROSTATE;  Surgeon: Kassie Ozell SAUNDERS, MD;  Location: [facility];  Service: Urology;  Laterality: N/A;   left cataract surgery Left    fall 2022   PROSTATE BIOPSY N/A 02/23/2022   Procedure: PROSTATE BIOPSY GRAYCE;  Surgeon: Kassie Ozell SAUNDERS, MD;  Location: [facility];  Service: Urology;  Laterality: N/A;    Family History  Problem Relation Age of Onset   Cancer Mother 15   Diabetes Mother    Hypertension Mother    Alzheimer's disease Father    Heart attack Father    Stomach cancer Maternal Aunt    Lung cancer Maternal Uncle     Social History   Socioeconomic History   Marital status: Married    Spouse name: Not on file   Number of children: 2   Years of education: Not on file   Highest education level: Not on file  Occupational History   Not on file  Tobacco Use   Smoking status: Former    Current packs/day: 0.00    Average packs/day: 0.1 packs/day for 49.0 years (4.9 ttl pk-yrs)    Types: Cigarettes    Start date: 11/1971    Quit date: 11/2020    Years since quitting: 3.7   Smokeless tobacco: Never  Vaping Use  Vaping status: Never Used  Substance and Sexual Activity   Alcohol use: Yes    Alcohol/week: 2.0 standard drinks of alcohol    Types: 2 Cans of beer per week    Comment: 2 beers a day and wine on special occasion   Drug use: Not Currently    Types: Marijuana, Cocaine    Comment: did drugs but quit in early 30's   Sexual activity: Yes  Other Topics Concern   Not on file  Social History Narrative   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    04/2019 moved from [place]    2 kids son 71, daughter 50 as of 01/13/20 daughter in [place]    Son [place]    2 grandsons    Likes to cruise    Social Drivers of Health   Financial Resource Strain: Low Risk  (03/30/2022)   Overall Financial Resource Strain (CARDIA)    Difficulty of Paying Living Expenses: Not hard at all  Food Insecurity: No Food Insecurity  (03/30/2022)   Hunger Vital Sign    Worried About Running Out of Food in the Last Year: Never true    Ran Out of Food in the Last Year: Never true  Transportation Needs: No Transportation Needs (03/30/2022)   PRAPARE - Administrator, Civil Service (Medical): No    Lack of Transportation (Non-Medical): No  Physical Activity: Sufficiently Active (03/30/2022)   Exercise Vital Sign    Days of Exercise per Week: 4 days    Minutes of Exercise per Session: 60 min  Stress: No Stress Concern Present (03/30/2022)   [employer] of Occupational Health - Occupational Stress Questionnaire    Feeling of Stress : Not at all  Social Connections: Unknown (03/30/2022)   Social Connection and Isolation Panel    Frequency of Communication with Friends and Family: Not on file    Frequency of Social Gatherings with Friends and Family: Not on file    Attends Religious Services: Not on file    Active Member of Clubs or Organizations: Not on file    Attends Banker Meetings: Not on file    Marital Status: Married  Intimate Partner Violence: Not At Risk (03/30/2022)   Humiliation, Afraid, Rape, and Kick questionnaire    Fear of Current or Ex-Partner: No    Emotionally Abused: No    Physically Abused: No    Sexually Abused: No     Outpatient Medications Prior to Visit  Medication Sig Dispense Refill   albuterol  (VENTOLIN  HFA) 108 (90 Base) MCG/ACT inhaler TAKE 2 PUFFS BY MOUTH EVERY 6 HOURS AS NEEDED FOR WHEEZE OR SHORTNESS OF BREATH 18 each 2   amLODipine  (NORVASC ) 5 MG tablet Take 1 tablet (5 mg total) by mouth in the morning. 90 tablet 3   amoxicillin -clavulanate (AUGMENTIN ) 875-125 MG tablet Take 1 tablet by mouth every 12 (twelve) hours. 14 tablet 0   azelastine  (ASTELIN ) 0.1 % nasal spray Place 2 sprays into both nostrils 2 (two) times daily. Use in each nostril as directed 30 mL 12   clonazePAM  (KLONOPIN ) 0.5 MG tablet Take 1 tablet (0.5 mg total) by mouth at bedtime. 90 tablet 1    fluticasone  (FLONASE ) 50 MCG/ACT nasal spray Place 2 sprays into both nostrils in the morning and at bedtime. 16 g 6   levocetirizine (XYZAL ) 5 MG tablet Take 1 tablet (5 mg total) by mouth every evening. 30 tablet 11  melatonin 5 MG TABS Take 1 tablet (5 mg total) by mouth at bedtime.  0   Multiple Vitamin (MULTIVITAMIN) tablet Take 1 tablet by mouth daily. One A Day     tadalafil  (CIALIS ) 5 MG tablet TAKE 1 TABLET (5 MG TOTAL) BY MOUTH DAILY. 30 tablet 0   No facility-administered medications prior to visit.    Allergies  Allergen Reactions   Bee Pollen Itching    Sinusitis   Pollen Extract Itching    Sinusitis    ROS Review of Systems Negative unless indicated in HPI.    Objective:    Physical Exam Constitutional:      Appearance: Normal appearance.  HENT:     Mouth/Throat:     Mouth: Mucous membranes are moist.  Eyes:     Conjunctiva/sclera: Conjunctivae normal.     Pupils: Pupils are equal, round, and reactive to light.  Cardiovascular:     Rate and Rhythm: Normal rate and regular rhythm.     Pulses: Normal pulses.     Heart sounds: Normal heart sounds.  Pulmonary:     Effort: Pulmonary effort is normal.     Breath sounds: Normal breath sounds.  Abdominal:     General: Bowel sounds are normal.     Palpations: Abdomen is soft.  Musculoskeletal:     Cervical back: Normal range of motion. No tenderness.  Skin:    General: Skin is warm.     Findings: No bruising.  Neurological:     General: No focal deficit present.     Mental Status: He is alert and oriented to person, place, and time. Mental status is at baseline.  Psychiatric:        Mood and Affect: Mood normal.        Behavior: Behavior normal.        Thought Content: Thought content normal.        Judgment: Judgment normal.     BP 116/70   Pulse 66   Temp (!) 97.5 F (36.4 C)   Ht 5' 9 (1.753 m)   Wt 144 lb (65.3 kg)   SpO2 98%   BMI 21.27 kg/m  Wt Readings from Last 3 Encounters:   08/22/24 144 lb (65.3 kg)  04/10/24 149 lb 9.6 oz (67.9 kg)  04/01/24 149 lb (67.6 kg)     Health Maintenance  Topic Date Due   DTaP/Tdap/Td (1 - Tdap) Never done   Pneumococcal Vaccine: 50+ Years (1 of 2 - PCV) Never done   Zoster Vaccines- Shingrix (1 of 2) Never done   Medicare Annual Wellness (AWV)  03/31/2023   COVID-19 Vaccine (7 - 2025-26 season) 09/05/2024 (Originally 07/28/2024)   Influenza Vaccine  02/24/2025 (Originally 06/27/2024)   Colonoscopy  12/28/2028   Hepatitis C Screening  Completed   Meningococcal B Vaccine  Aged Out    There are no preventive care reminders to display for this patient.  Lab Results  Component Value Date   TSH 0.789 08/22/2024   Lab Results  Component Value Date   WBC 3.2 (L) 08/22/2024   HGB 15.0 08/22/2024   HCT 46.2 08/22/2024   MCV 91 08/22/2024   PLT 305 08/22/2024   Lab Results  Component Value Date   NA 140 08/22/2024   K 4.4 08/22/2024   CO2 25 08/22/2024   GLUCOSE 97 08/22/2024   BUN 10 08/22/2024   CREATININE 1.13 08/22/2024   BILITOT 0.5 08/22/2024   ALKPHOS 71 08/22/2024   AST 19 08/22/2024  ALT 26 08/22/2024   PROT 7.0 08/22/2024   ALBUMIN 4.0 08/22/2024   CALCIUM 9.3 08/22/2024   ANIONGAP 6 04/07/2022   EGFR 69 08/22/2024   GFR 58.05 (L) 03/25/2024   Lab Results  Component Value Date   CHOL 232 (H) 08/22/2024   Lab Results  Component Value Date   HDL 62 08/22/2024   Lab Results  Component Value Date   LDLCALC 156 (H) 08/22/2024   Lab Results  Component Value Date   TRIG 81 08/22/2024   Lab Results  Component Value Date   CHOLHDL 3.7 08/22/2024   Lab Results  Component Value Date   HGBA1C 5.9 (H) 08/22/2024      Assessment & Plan:  Erectile dysfunction, unspecified erectile dysfunction type Assessment & Plan: Well controlled on Cialis  5mg  daily.   Orders: -     Tadalafil ; Take 1 tablet (5 mg total) by mouth daily.  Dispense: 30 tablet; Refill: 0  Prostate cancer [facility]) Assessment &  Plan: History of prostate cancer with previous follow-up by urologist. PSA levels monitored biannually. - Order PSA test. - Refer to new urologist if PSA elevated.  Orders: -     PSA  Essential hypertension Assessment & Plan: Chronic stable. -Continue amlodipine  5 mg daily.  Orders: -     Comprehensive metabolic panel with GFR -     CBC with Differential/Platelet -     TSH  Prediabetes -     Hemoglobin A1c  OSA on CPAP Assessment & Plan: Managed with CPAP. Reports improved sleep quality.   Hyperlipidemia, unspecified hyperlipidemia type Assessment & Plan: Hyperlipidemia with fluctuating cholesterol levels.  Do not want to take statin therapy. - Will check lipid panel   Orders: -     Lipid panel    Follow-up: Return in about 6 months (around 02/19/2025) for chronic management.   Eldwin Volkov, NP

## 2024-08-23 LAB — COMPREHENSIVE METABOLIC PANEL WITH GFR
ALT: 26 IU/L (ref 0–44)
AST: 19 IU/L (ref 0–40)
Albumin: 4 g/dL (ref 3.8–4.8)
Alkaline Phosphatase: 71 IU/L (ref 47–123)
BUN/Creatinine Ratio: 9 — ABNORMAL LOW (ref 10–24)
BUN: 10 mg/dL (ref 8–27)
Bilirubin Total: 0.5 mg/dL (ref 0.0–1.2)
CO2: 25 mmol/L (ref 20–29)
Calcium: 9.3 mg/dL (ref 8.6–10.2)
Chloride: 102 mmol/L (ref 96–106)
Creatinine, Ser: 1.13 mg/dL (ref 0.76–1.27)
Globulin, Total: 3 g/dL (ref 1.5–4.5)
Glucose: 97 mg/dL (ref 70–99)
Potassium: 4.4 mmol/L (ref 3.5–5.2)
Sodium: 140 mmol/L (ref 134–144)
Total Protein: 7 g/dL (ref 6.0–8.5)
eGFR: 69 mL/min/1.73 (ref >59)

## 2024-08-23 LAB — CBC WITH DIFFERENTIAL/PLATELET
Basophils Absolute: 0 x10E3/uL (ref 0.0–0.2)
Basos: 1 %
EOS (ABSOLUTE): 0 x10E3/uL (ref 0.0–0.4)
Eos: 0 %
Hematocrit: 46.2 % (ref 37.5–51.0)
Hemoglobin: 15 g/dL (ref 13.0–17.7)
Immature Grans (Abs): 0 x10E3/uL (ref 0.0–0.1)
Immature Granulocytes: 0 %
Lymphocytes Absolute: 1.8 x10E3/uL (ref 0.7–3.1)
Lymphs: 58 %
MCH: 29.6 pg (ref 26.6–33.0)
MCHC: 32.5 g/dL (ref 31.5–35.7)
MCV: 91 fL (ref 79–97)
Monocytes Absolute: 0.3 x10E3/uL (ref 0.1–0.9)
Monocytes: 8 %
Neutrophils Absolute: 1.1 x10E3/uL — ABNORMAL LOW (ref 1.4–7.0)
Neutrophils: 33 %
Platelets: 305 x10E3/uL (ref 150–450)
RBC: 5.06 x10E6/uL (ref 4.14–5.80)
RDW: 12.8 % (ref 11.6–15.4)
WBC: 3.2 x10E3/uL — ABNORMAL LOW (ref 3.4–10.8)

## 2024-08-23 LAB — LIPID PANEL
Chol/HDL Ratio: 3.7 ratio (ref 0.0–5.0)
Cholesterol, Total: 232 mg/dL — ABNORMAL HIGH (ref 100–199)
HDL: 62 mg/dL (ref >39)
LDL Chol Calc (NIH): 156 mg/dL — ABNORMAL HIGH (ref 0–99)
Triglycerides: 81 mg/dL (ref 0–149)
VLDL Cholesterol Cal: 14 mg/dL (ref 5–40)

## 2024-08-23 LAB — HEMOGLOBIN A1C
Est. average glucose Bld gHb Est-mCnc: 123 mg/dL
Hgb A1c MFr Bld: 5.9 % — ABNORMAL HIGH (ref 4.8–5.6)

## 2024-08-23 LAB — TSH: TSH: 0.789 u[IU]/mL (ref 0.450–4.500)

## 2024-08-23 LAB — PSA: Prostate Specific Ag, Serum: <0.1 ng/mL (ref 0.0–4.0)

## 2024-08-25 ENCOUNTER — Other Ambulatory Visit (HOSPITAL_COMMUNITY): Payer: Self-pay

## 2024-09-03 ENCOUNTER — Ambulatory Visit: Payer: Self-pay | Admitting: Nurse Practitioner

## 2024-09-03 NOTE — Progress Notes (Signed)
 The 10-year ASCVD risk score (Arnett DK, et al., 2019) is: 18.6% Total cholesterol and LDL is increased compared to the previous labs.  Would recommend starting on statin therapy. If patient is agreeable we will send Crestor 5 mg.  All other labs stable.

## 2024-09-03 NOTE — Assessment & Plan Note (Signed)
 Well controlled on Cialis 5 mg daily.

## 2024-09-03 NOTE — Assessment & Plan Note (Signed)
 Managed with CPAP. Reports improved sleep quality.

## 2024-09-03 NOTE — Assessment & Plan Note (Signed)
Chronic stable  Continue amlodipine '5mg'$  daily

## 2024-09-03 NOTE — Assessment & Plan Note (Signed)
 Hyperlipidemia with fluctuating cholesterol levels.  Do not want to take statin therapy. - Will check lipid panel

## 2024-09-03 NOTE — Assessment & Plan Note (Signed)
 History of prostate cancer with previous follow-up by urologist. PSA levels monitored biannually. - Order PSA test. - Refer to new urologist if PSA elevated.

## 2024-09-15 ENCOUNTER — Encounter: Payer: Self-pay | Admitting: Sleep Medicine

## 2024-09-15 ENCOUNTER — Ambulatory Visit: Admitting: Sleep Medicine

## 2024-09-15 VITALS — BP 130/80 | HR 72 | Temp 97.8°F | Ht 69.0 in | Wt 151.0 lb

## 2024-09-15 DIAGNOSIS — G4752 REM sleep behavior disorder: Secondary | ICD-10-CM

## 2024-09-15 DIAGNOSIS — G4733 Obstructive sleep apnea (adult) (pediatric): Secondary | ICD-10-CM

## 2024-09-15 DIAGNOSIS — I10 Essential (primary) hypertension: Secondary | ICD-10-CM | POA: Diagnosis not present

## 2024-09-15 MED ORDER — CLONAZEPAM 0.5 MG PO TABS: 0.5000 mg | ORAL_TABLET | Freq: Every day | ORAL | 1 refills | Status: AC

## 2024-09-15 NOTE — Progress Notes (Signed)
 Name:David Church MRN: [id number] DOB: Feb 01, 1951   CHIEF COMPLAINT:  CPAP F/U, RBD   HISTORY OF PRESENT ILLNESS:  David Church is a 73 y.o. w/ a h/o OSA, RBD and HTN who presents for RBD and CPAP f/u. Reports using CPAP therapy every night, which is confirmed by compliance data. He is currently using a full face mask, which is comfortable. Reports feeling more refreshed upon awakening with CPAP therapy. States that Clonazepam  has helped sleep and RBD significantly.     EPWORTH SLEEP SCORE    09/30/2020   11:00 AM  Results of the Epworth flowsheet  Sitting and reading 0  Watching TV 2  Sitting, inactive in a public place (e.g. a theatre or a meeting) 0  As a passenger in a car for an hour without a break 0  Lying down to rest in the afternoon when circumstances permit 2  Sitting and talking to someone 0  Sitting quietly after a lunch without alcohol 0  In a car, while stopped for a few minutes in traffic 0  Total score 4    PAST MEDICAL HISTORY :   has a past medical history of Allergy, Closed compression fracture of L5 vertebra (HCC), COVID-19 (08/24/2021), DDD (degenerative disc disease), lumbar, Granulomatous lung disease (HCC), Hemangioma, Hiatal hernia, Hypertension, Liver cyst, OSA on CPAP, Parasomnia, Prediabetes, Prostate cancer (HCC), and REM sleep behavior disorder.  has a past surgical history that includes Hernia repair; Axillary lymph node biopsy (Right, 09/18/2019); left cataract surgery (Left); Cardiac catheterization; Prostate biopsy (N/A, 02/23/2022); and High intensity focused ultrasound (hifu) of the prostrate (N/A, 04/13/2022). Prior to Admission medications   Medication Sig Start Date End Date Taking? Authorizing Provider  albuterol  (VENTOLIN  HFA) 108 (90 Base) MCG/ACT inhaler Inhale 2 puffs into the lungs every 6 (six) hours as needed for wheezing or shortness of breath. 03/25/24  Yes Hope Merle, MD  amLODipine  (NORVASC ) 5 MG tablet Take 1 tablet (5 mg  total) by mouth in the morning. 03/25/24  Yes Hope Merle, MD  azelastine  (ASTELIN ) 0.1 % nasal spray Place 2 sprays into both nostrils 2 (two) times daily. Use in each nostril as directed 03/25/24  Yes Hope Merle, MD  fluticasone  (FLONASE ) 50 MCG/ACT nasal spray Place 2 sprays into both nostrils in the morning and at bedtime. 03/25/24  Yes Walsh, Tanya, MD  levocetirizine (XYZAL ) 5 MG tablet Take 1 tablet (5 mg total) by mouth every evening. 03/25/24  Yes Hope Merle, MD  melatonin 5 MG TABS Take 1 tablet (5 mg total) by mouth at bedtime. 08/04/22  Yes Sood, Vineet, MD  Multiple Vitamin (MULTIVITAMIN) tablet Take 1 tablet by mouth daily. One A Day   Yes [provider]  tadalafil  (CIALIS ) 5 MG tablet TAKE 1 TABLET BY MOUTH EVERY DAY IN THE EVENING 04/04/24  Yes Hope Merle, MD   Allergies  Allergen Reactions   Bee Pollen Itching    Sinusitis   Pollen Extract Itching    Sinusitis    FAMILY HISTORY:  family history includes Alzheimer's disease in his father; Cancer (age of onset: 46) in his mother; Diabetes in his mother; Heart attack in his father; Hypertension in his mother; Lung cancer in his maternal uncle; Stomach cancer in his maternal aunt. SOCIAL HISTORY:  reports that he quit smoking about 3 years ago. His smoking use included cigarettes. He started smoking about 52 years ago. He has a 4.9 pack-year smoking history. He has never used smokeless tobacco. He  reports current alcohol use of about 2.0 standard drinks of alcohol per week. He reports that he does not currently use drugs after having used the following drugs: Marijuana and Cocaine.   Review of Systems:  Gen:  Denies  fever, sweats, chills weight loss  HEENT: Denies blurred vision, double vision, ear pain, eye pain, hearing loss, nose bleeds, sore throat Cardiac:  No dizziness, chest pain or heaviness, chest tightness,edema, No JVD Resp:   No cough, -sputum production, -shortness of breath,-wheezing, -hemoptysis,  Gi:  Denies swallowing difficulty, stomach pain, nausea or vomiting, diarrhea, constipation, bowel incontinence Gu:  Denies bladder incontinence, burning urine Ext:   Denies Joint pain, stiffness or swelling Skin: Denies  skin rash, easy bruising or bleeding or hives Endoc:  Denies polyuria, polydipsia , polyphagia or weight change Psych:   Denies depression, insomnia or hallucinations  Other:  All other systems negative  VITAL SIGNS: BP 130/80   Pulse 72   Temp 97.8 F (36.6 C)   Ht 5' 9 (1.753 m)   Wt 151 lb (68.5 kg)   SpO2 98%   BMI 22.30 kg/m    Physical Examination:   General Appearance: No distress  EYES PERRLA, EOM intact.   NECK Supple, No JVD Pulmonary: normal breath sounds, No wheezing.  CardiovascularNormal S1,S2.  No m/r/g.   Abdomen: Benign, Soft, non-tender. Skin:   warm, no rashes, no ecchymosis  Extremities: normal, no cyanosis, clubbing. Neuro:without focal findings,  speech normal  PSYCHIATRIC: Mood, affect within normal limits.   ASSESSMENT AND PLAN  OSA Patient is using and benefiting from CPAP therapy. Discussed the consequences of untreated sleep apnea. Advised not to drive drowsy for safety of patient and others. Will follow up in 6 months.   RBD Stable, on Clonazepam  0.5 mg nightly. Continuing on current management.   HTN Stable, on current management. Following with PCP.    Patient  satisfied with Plan of action and management. All questions answered  I spent a total of 30 minutes reviewing chart data, face-to-face evaluation with the patient, counseling and coordination of care as detailed above.    [name], M.D.  Sleep Medicine [facility]

## 2024-09-17 ENCOUNTER — Other Ambulatory Visit: Payer: Self-pay | Admitting: Nurse Practitioner

## 2024-09-17 DIAGNOSIS — N529 Male erectile dysfunction, unspecified: Secondary | ICD-10-CM

## 2024-09-19 ENCOUNTER — Telehealth: Payer: Self-pay | Admitting: Sleep Medicine

## 2024-09-19 NOTE — Telephone Encounter (Signed)
 Dr. Jess placed an order for replacement cpap and it was sent to [facility]. We received a note from [facility] the patient has received a machine within the past five years and is not eligible for replacement machine. The patient has been using [facility]

## 2024-09-23 ENCOUNTER — Encounter: Admitting: Nurse Practitioner

## 2024-10-06 ENCOUNTER — Ambulatory Visit (INDEPENDENT_AMBULATORY_CARE_PROVIDER_SITE_OTHER): Admitting: *Deleted

## 2024-10-06 VITALS — Ht 69.0 in | Wt 150.0 lb

## 2024-10-06 DIAGNOSIS — Z0000 Encounter for general adult medical examination without abnormal findings: Secondary | ICD-10-CM

## 2024-10-06 NOTE — Progress Notes (Signed)
 Subjective:   David Church is a 73 y.o. male who presents for a Medicare Annual Wellness Visit.  Allergies (verified) Bee pollen and Pollen extract   History: Past Medical History:  Diagnosis Date   Allergy    Closed compression fracture of L5 vertebra (HCC)    COVID-19 08/24/2021   molnupiravir  x bid x 5 days   DDD (degenerative disc disease), lumbar    Granulomatous lung disease (HCC)    Hemangioma    Hiatal hernia    Hypertension    Liver cyst    OSA on CPAP    Parasomnia    Prediabetes    6.1 10/23/18   Prostate cancer (HCC)    REM sleep behavior disorder    Past Surgical History:  Procedure Laterality Date   AXILLARY LYMPH NODE BIOPSY Right 09/18/2019   Procedure: AXILLARY LYMPH NODE BIOPSY;  Surgeon: Jordis Laneta FALCON, MD;  Location: [facility];  Service: General;  Laterality: Right;   CARDIAC CATHETERIZATION     HERNIA REPAIR     15 years ago-right inguinal    HIGH INTENSITY FOCUSED ULTRASOUND (HIFU) OF THE PROSTATE N/A 04/13/2022   Procedure: HIGH INTENSITY FOCUSED ULTRASOUND (HIFU) OF THE PROSTATE;  Surgeon: Kassie Ozell SAUNDERS, MD;  Location: [facility];  Service: Urology;  Laterality: N/A;   left cataract surgery Left    fall 2022   PROSTATE BIOPSY N/A 02/23/2022   Procedure: PROSTATE BIOPSY GRAYCE;  Surgeon: Kassie Ozell SAUNDERS, MD;  Location: [facility];  Service: Urology;  Laterality: N/A;   Family History  Problem Relation Age of Onset   Cancer Mother 31   Diabetes Mother    Hypertension Mother    Alzheimer's disease Father    Heart attack Father    Stomach cancer Maternal Aunt    Lung cancer Maternal Uncle    Social History   Occupational History   Not on file  Tobacco Use   Smoking status: Former    Current packs/day: 0.00    Average packs/day: 0.1 packs/day for 49.0 years (4.9 ttl pk-yrs)    Types: Cigarettes    Start date: 11/1971    Quit date: 11/2020    Years since quitting: 3.8   Smokeless tobacco: Never  Vaping Use   Vaping status: Never  Used  Substance and Sexual Activity   Alcohol use: Yes    Alcohol/week: 2.0 standard drinks of alcohol    Types: 2 Cans of beer per week    Comment: 2 beers a day and wine on special occasion   Drug use: Not Currently    Types: Marijuana, Cocaine    Comment: did drugs but quit in early 30's   Sexual activity: Yes   Tobacco Counseling Counseling given: Not Answered  SDOH Screenings   Food Insecurity: No Food Insecurity (10/06/2024)  Housing: Low Risk  (10/06/2024)  Transportation Needs: No Transportation Needs (10/06/2024)  Utilities: Not At Risk (10/06/2024)  Alcohol Screen: Low Risk  (10/06/2024)  Depression (PHQ2-9): Low Risk  (10/06/2024)  Financial Resource Strain: Low Risk  (10/06/2024)  Physical Activity: Inactive (10/06/2024)  Social Connections: Socially Integrated (10/06/2024)  Stress: No Stress Concern Present (10/06/2024)  Tobacco Use: Medium Risk (10/06/2024)  Health Literacy: Adequate Health Literacy (10/06/2024)   Depression Screen    10/06/2024    1:57 PM 08/22/2024    2:06 PM 03/25/2024    8:48 AM 09/03/2023   10:22 AM 04/28/2022    1:16 PM 03/30/2022   12:28 PM 03/09/2021    3:11  PM  PHQ 2/9 Scores  PHQ - 2 Score 0 0 0 0 0 0 0  PHQ- 9 Score 0 0  0  0         Data saved with a previous flowsheet row definition      Goals Addressed             This Visit's Progress    Patient Stated       Wants to walk more       Visit info / Clinical Intake: Medicare Wellness Visit Type:: Subsequent Annual Wellness Visit Persons participating in visit:: patient Medicare Wellness Visit Mode:: Video Because this visit was a virtual/telehealth visit:: pt reported vitals If Telephone or Video please confirm:: I connected with the patient using audio enabled telemedicine application and verified that I am speaking with the correct person using two identifiers; I discussed the limitations of evaluation and management by telemedicine; The patient expressed  understanding and agreed to proceed Patient Location:: Home Provider Location:: Home Office Information given by:: patient Interpreter Needed?: No Pre-visit prep was completed: yes AWV questionnaire completed by patient prior to visit?: no Living arrangements:: lives with spouse/significant other Patient's Overall Health Status Rating: good Typical amount of pain: some Does pain affect daily life?: no Are you currently prescribed opioids?: no  Dietary Habits and Nutritional Risks How many meals a day?: 2 Eats fruit and vegetables daily?: (!) no Most meals are obtained by: preparing own meals Diabetic:: no  Functional Status Activities of Daily Living (to include ambulation/medication): Independent Ambulation: Independent Medication Administration: Independent Home Management: Independent Manage your own finances?: yes Primary transportation is: driving Concerns about vision?: no *vision screening is required for WTM* Concerns about hearing?: no  Fall Screening Falls in the past year?: 0 Number of falls in past year: 0 Was there an injury with Fall?: 0 Fall Risk Category Calculator: 0 Patient Fall Risk Level: Low Fall Risk  Fall Risk Patient at Risk for Falls Due to: No Fall Risks Fall risk Follow up: Falls evaluation completed; Falls prevention discussed  Home and Transportation Safety: All rugs have non-skid backing?: yes All stairs or steps have railings?: yes Grab bars in the bathtub or shower?: (!) no Have non-skid surface in bathtub or shower?: yes Good home lighting?: yes Regular seat belt use?: yes Hospital stays in the last year:: no  Cognitive Assessment Difficulty concentrating, remembering, or making decisions? : no Will 6CIT or Mini Cog be Completed: yes What year is it?: 0 points What month is it?: 0 points Give patient an address phrase to remember (5 components): [address] [place] what time is it?: 0 points Count backwards from 20  to 1: 0 points Say the months of the year in reverse: 4 points Repeat the address phrase from earlier: 2 points 6 CIT Score: 6 points  Advance Directives (For Healthcare) Does Patient Have a Medical Advance Directive?: Yes Does patient want to make changes to medical advance directive?: No - Patient declined Type of Advance Directive: Healthcare Power of [place]; Living will Copy of Healthcare Power of Attorney in Chart?: Yes - validated most recent copy scanned in chart (See row information)  Reviewed/Updated  Reviewed/Updated: Reviewed All (Medical, Surgical, Family, Medications, Allergies, Care Teams, Patient Goals)        Objective:    Today's Vitals   10/06/24 1344  Weight: 150 lb (68 kg)  Height: 5' 9 (1.753 m)   Body mass index is 22.15 kg/m.  Current Medications (verified)  Outpatient Encounter Medications as of 10/06/2024  Medication Sig   albuterol  (VENTOLIN  HFA) 108 (90 Base) MCG/ACT inhaler TAKE 2 PUFFS BY MOUTH EVERY 6 HOURS AS NEEDED FOR WHEEZE OR SHORTNESS OF BREATH   amLODipine  (NORVASC ) 5 MG tablet Take 1 tablet (5 mg total) by mouth in the morning.   azelastine  (ASTELIN ) 0.1 % nasal spray Place 2 sprays into both nostrils 2 (two) times daily. Use in each nostril as directed   clonazePAM  (KLONOPIN ) 0.5 MG tablet Take 1 tablet (0.5 mg total) by mouth at bedtime.   fluticasone  (FLONASE ) 50 MCG/ACT nasal spray Place 2 sprays into both nostrils in the morning and at bedtime.   levocetirizine (XYZAL ) 5 MG tablet Take 1 tablet (5 mg total) by mouth every evening.   Multiple Vitamin (MULTIVITAMIN) tablet Take 1 tablet by mouth daily. One A Day   tadalafil  (CIALIS ) 5 MG tablet TAKE 1 TABLET (5 MG TOTAL) BY MOUTH DAILY.   melatonin 5 MG TABS Take 1 tablet (5 mg total) by mouth at bedtime. (Patient not taking: Reported on 10/06/2024)   [DISCONTINUED] amoxicillin -clavulanate (AUGMENTIN ) 875-125 MG tablet Take 1 tablet by mouth every 12 (twelve) hours. (Patient not  taking: Reported on 10/06/2024)   No facility-administered encounter medications on file as of 10/06/2024.   Hearing/Vision screen Hearing Screening - Comments:: No  issues Vision Screening - Comments:: Glasses, [facility] , up to date  Immunizations and Health Maintenance Health Maintenance  Topic Date Due   DTaP/Tdap/Td (1 - Tdap) Never done   Pneumococcal Vaccine: 50+ Years (1 of 2 - PCV) Never done   Zoster Vaccines- Shingrix (1 of 2) Never done   COVID-19 Vaccine (7 - 2025-26 season) 07/28/2024   Influenza Vaccine  02/24/2025 (Originally 06/27/2024)   Medicare Annual Wellness (AWV)  10/06/2025   Colonoscopy  12/28/2028   Hepatitis C Screening  Completed   Meningococcal B Vaccine  Aged Out        Assessment/Plan:  This is a routine wellness examination for [place].  Patient Care Team: Vincente Saber, NP as PCP - General (Nurse Practitioner) Kasa, Kurian, MD as Consulting Physician (Pulmonary Disease)  I have personally reviewed and noted the following in the patient's chart:   Medical and social history Use of alcohol, tobacco or illicit drugs  Current medications and supplements including opioid prescriptions. Functional ability and status Nutritional status Physical activity Advanced directives List of other physicians Hospitalizations, surgeries, and ER visits in previous 12 months Vitals Screenings to include cognitive, depression, and falls Referrals and appointments  No orders of the defined types were placed in this encounter.  In addition, I have reviewed and discussed with patient certain preventive protocols, quality metrics, and best practice recommendations. A written personalized care plan for preventive services as well as general preventive health recommendations were provided to patient.   Angeline Fredericks, LPN   [id number]   Return in 1 year (on 10/06/2025).  After Visit Summary: (MyChart) Due to this being a telephonic visit, the after visit  summary with patients personalized plan was offered to patient via MyChart   Nurse Notes: Patient declines all vaccines.

## 2024-10-06 NOTE — Patient Instructions (Signed)
 David Church,  Thank you for taking the time for your Medicare Wellness Visit. I appreciate your continued commitment to your health goals. Please review the care plan we discussed, and feel free to reach out if I can assist you further.  Please note that Annual Wellness Visits do not include a physical exam. Some assessments may be limited, especially if the visit was conducted virtually. If needed, we may recommend an in-person follow-up with your provider.  Ongoing Care Seeing your primary care provider every 3 to 6 months helps us  monitor your health and provide consistent, personalized care.  Consider updating your vaccines.  Referrals If a referral was made during today's visit and you haven't received any updates within two weeks, please contact the referred provider directly to check on the status.  Recommended Screenings:  Health Maintenance  Topic Date Due   DTaP/Tdap/Td vaccine (1 - Tdap) Never done   Pneumococcal Vaccine for age over 42 (1 of 2 - PCV) Never done   Zoster (Shingles) Vaccine (1 of 2) Never done   COVID-19 Vaccine (7 - 2025-26 season) 07/28/2024   Flu Shot  02/24/2025*   Medicare Annual Wellness Visit  10/06/2025   Colon Cancer Screening  12/28/2028   Hepatitis C Screening  Completed   Meningitis B Vaccine  Aged Out  *Topic was postponed. The date shown is not the original due date.       10/06/2024    1:48 PM  Advanced Directives  Does Patient Have a Medical Advance Directive? Yes  Type of Estate Agent of [place];Living will  Does patient want to make changes to medical advance directive? No - Patient declined  Copy of Healthcare Power of Attorney in Chart? Yes - validated most recent copy scanned in chart (See row information)    Vision: Annual vision screenings are recommended for early detection of glaucoma, cataracts, and diabetic retinopathy. These exams can also reveal signs of chronic conditions such as diabetes and high  blood pressure.  Dental: Annual dental screenings help detect early signs of oral cancer, gum disease, and other conditions linked to overall health, including heart disease and diabetes.  Please see the attached documents for additional preventive care recommendations.

## 2024-11-03 ENCOUNTER — Other Ambulatory Visit: Payer: Self-pay | Admitting: Nurse Practitioner

## 2024-11-03 DIAGNOSIS — N529 Male erectile dysfunction, unspecified: Secondary | ICD-10-CM

## 2024-12-16 ENCOUNTER — Other Ambulatory Visit: Payer: Self-pay | Admitting: Nurse Practitioner

## 2024-12-16 DIAGNOSIS — N529 Male erectile dysfunction, unspecified: Secondary | ICD-10-CM

## 2024-12-26 ENCOUNTER — Encounter: Admitting: Nurse Practitioner

## 2024-12-30 ENCOUNTER — Telehealth: Payer: Self-pay

## 2025-02-17 ENCOUNTER — Ambulatory Visit: Admitting: Nurse Practitioner

## 2025-10-12 ENCOUNTER — Ambulatory Visit
